# Patient Record
Sex: Female | Born: 1995 | Hispanic: No | Marital: Married | Smoking: Former smoker
Health system: Southern US, Community
[De-identification: ages and names within clinical notes are randomized; demographics above are authoritative.]

## PROBLEM LIST (undated history)

## (undated) ENCOUNTER — Ambulatory Visit: Admission: EM | Payer: BC Managed Care – PPO | Source: Home / Self Care

## (undated) DIAGNOSIS — B9689 Other specified bacterial agents as the cause of diseases classified elsewhere: Secondary | ICD-10-CM

## (undated) DIAGNOSIS — F32A Depression, unspecified: Secondary | ICD-10-CM

## (undated) DIAGNOSIS — J45909 Unspecified asthma, uncomplicated: Secondary | ICD-10-CM

## (undated) DIAGNOSIS — B009 Herpesviral infection, unspecified: Secondary | ICD-10-CM

## (undated) DIAGNOSIS — B379 Candidiasis, unspecified: Secondary | ICD-10-CM

## (undated) DIAGNOSIS — M6289 Other specified disorders of muscle: Secondary | ICD-10-CM

## (undated) DIAGNOSIS — L509 Urticaria, unspecified: Secondary | ICD-10-CM

## (undated) DIAGNOSIS — I82409 Acute embolism and thrombosis of unspecified deep veins of unspecified lower extremity: Secondary | ICD-10-CM

## (undated) DIAGNOSIS — N760 Acute vaginitis: Secondary | ICD-10-CM

## (undated) HISTORY — DX: Herpesviral infection, unspecified: B00.9

## (undated) HISTORY — DX: Other specified bacterial agents as the cause of diseases classified elsewhere: B96.89

## (undated) HISTORY — DX: Acute embolism and thrombosis of unspecified deep veins of unspecified lower extremity: I82.409

## (undated) HISTORY — DX: Other specified disorders of muscle: M62.89

## (undated) HISTORY — DX: Urticaria, unspecified: L50.9

## (undated) HISTORY — DX: Candidiasis, unspecified: B37.9

## (undated) HISTORY — DX: Depression, unspecified: F32.A

## (undated) HISTORY — PX: INDUCED ABORTION: SHX677

## (undated) HISTORY — DX: Other specified bacterial agents as the cause of diseases classified elsewhere: N76.0

## (undated) HISTORY — PX: ABDOMINAL HERNIA REPAIR: SHX539

---
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
## 2016-08-30 ENCOUNTER — Ambulatory Visit: Payer: Self-pay | Admitting: Student

## 2016-09-21 ENCOUNTER — Encounter: Payer: Medicaid Other | Admitting: Family Medicine

## 2016-12-01 ENCOUNTER — Ambulatory Visit: Payer: Medicaid Other | Admitting: Family Medicine

## 2016-12-01 NOTE — Progress Notes (Deleted)
    Subjective: CC: insomnia HPI: Patient is a 21 y.o. female presenting to clinic today for insomnia.    Social History: ***   ROS: All other systems reviewed and are negative.  Past Medical History Patient Active Problem List   Diagnosis Date Noted  . URI (upper respiratory infection) 08/26/2015  . Globus sensation 07/11/2015  . Thyroid disorder screening 07/11/2015  . Acne 03/28/2013  . Multiple allergies 02/21/2013  . OVERWEIGHT 04/03/2010    Medications- reviewed and updated Current Outpatient Prescriptions  Medication Sig Dispense Refill  . adapalene (DIFFERIN) 0.1 % cream Apply topically at bedtime. 45 g 3  . cetirizine (ZYRTEC) 10 MG tablet Take 1 tablet (10 mg total) by mouth daily. 30 tablet 3  . clindamycin (CLINDAGEL) 1 % gel Apply topically 2 (two) times daily. 30 g 0  . diazepam (VALIUM) 10 MG tablet Take 10 mg by mouth every 6 (six) hours as needed for anxiety.    . diphenhydrAMINE (BENADRYL) 25 mg capsule Take 50 mg by mouth every 6 (six) hours as needed.    . fluticasone (FLONASE) 50 MCG/ACT nasal spray Place 2 sprays into the nose daily. Two sprays into each nostril. 16 g 3  . ibuprofen (ADVIL,MOTRIN) 600 MG tablet Take 1 tablet (600 mg total) by mouth every 6 (six) hours as needed. 30 tablet 0  . ibuprofen (ADVIL,MOTRIN) 600 MG tablet Take 1 tablet (600 mg total) by mouth every 8 (eight) hours as needed. 30 tablet 0  . ipratropium (ATROVENT) 0.03 % nasal spray Place 2 sprays into both nostrils every 12 (twelve) hours. 30 mL 3  . naproxen (NAPROSYN) 500 MG tablet Take 1 tablet (500 mg total) by mouth 2 (two) times daily with a meal. (Patient not taking: Reported on 07/09/2015) 30 tablet 0   No current facility-administered medications for this visit.     Objective: Office vital signs reviewed. There were no vitals taken for this visit.   Physical Examination:  General: Awake, alert, *** nourished, NAD ENMT:  TMs intact, normal light reflex, no erythema,  no bulging. Nasal turbinates moist. MMM, Oropharynx clear without erythema or tonsillar exudate/hypertrophy Eyes: Conjunctiva non-injected. PERRL.  Cardio: RRR, no m/r/g noted.  Pulm: No increased WOB.  CTAB, without wheezes, rhonchi or crackles noted.  GI: soft, NT/ND,+BS x4, no hepatomegaly, no splenomegaly GU: deffered Extremities: WWP, No edema, cyanosis or clubbing; +*** pulses bilaterally MSK: Normal gait and station Skin: dry, intact, no rashes or lesions Neuro: Strength and sensation grossly intact, DTRs ***/4  Assessment/Plan: No problem-specific Assessment & Plan notes found for this encounter.   No orders of the defined types were placed in this encounter.   No orders of the defined types were placed in this encounter.   Joanna Puffrystal S. Sharni Negron PGY-3, [facility]

## 2016-12-07 ENCOUNTER — Ambulatory Visit: Payer: Medicaid Other | Admitting: Family Medicine

## 2017-01-07 ENCOUNTER — Ambulatory Visit: Payer: Medicaid Other | Admitting: Family Medicine

## 2017-02-07 ENCOUNTER — Encounter: Payer: Self-pay | Admitting: Internal Medicine

## 2017-02-07 ENCOUNTER — Ambulatory Visit (INDEPENDENT_AMBULATORY_CARE_PROVIDER_SITE_OTHER): Payer: BLUE CROSS/BLUE SHIELD | Admitting: Internal Medicine

## 2017-02-07 VITALS — BP 110/76 | HR 77 | Temp 98.4°F | Ht 64.0 in | Wt 163.0 lb

## 2017-02-07 DIAGNOSIS — J069 Acute upper respiratory infection, unspecified: Secondary | ICD-10-CM

## 2017-02-07 MED ORDER — BENZONATATE 100 MG PO CAPS: 100.0000 mg | ORAL_CAPSULE | Freq: Three times a day (TID) | ORAL | 0 refills | Status: DC | PRN

## 2017-02-07 MED ORDER — IBUPROFEN 600 MG PO TABS: 600.0000 mg | ORAL_TABLET | Freq: Three times a day (TID) | ORAL | 0 refills | Status: DC | PRN

## 2017-02-07 MED ORDER — OXYMETAZOLINE HCL 0.05 % NA SOLN: 1.0000 | Freq: Two times a day (BID) | NASAL | 0 refills | Status: DC

## 2017-02-07 NOTE — Progress Notes (Signed)
   [facility] Noralee CharsAsiyah Shawntia Mangal, MD Phone: [phone]  Reason For Visit: SDA for URI   URI  Has been sick for 3-4 days. Nasal discharge: yes Medications tried: DayQuil, Ibuprofen Sick contacts: work   Symptoms Fever: None  Headache or face pain: yes  Scratchy throat: yes  Allergies: none Muscle aches: None Severe fatigue: None  Stiff neck: None  Cough: Yes  Shortness of breath: None  Rash: None  Sore throat or swollen glands: None    Past Medical History Reviewed problem list.  Medications- reviewed and updated No additions to family history Social history- patient is a non smoker  Objective: BP 110/76 (BP Location: Left Arm, Patient Position: Sitting, Cuff Size: Normal)   Pulse 77   Temp 98.4 F (36.9 C) (Oral)   Ht 5\' 4"  (1.626 m)   Wt 163 lb (73.9 kg)   LMP 02/04/2017   SpO2 99%   BMI 27.98 kg/m  Gen: NAD, alert, cooperative with exam HEENT: Normal, no tenderness to palpation of maxillary or ethmoid sinuses     Neck: No masses palpated. No lymphadenopathy    Ears: Tympanic membranes intact, normal light reflex, no erythema, no bulging    Nose: nasal turbinates congested     Throat: erythematous throat, though tonsils wnl  Cardio: regular rate and rhythm, S1S2 heard, no murmurs appreciated Pulm: clear to auscultation bilaterally, no wheezes, rhonchi or rales Skin: dry, intact, no rashes or lesions    Assessment/Plan: See problem based a/p  URI (upper respiratory infection) Acute upper respiratory infection, symptomatic treatment. No concern for sinus infection  - ibuprofen (ADVIL,MOTRIN) 600 MG tablet; Take 1 tablet (600 mg total) by mouth every 8 (eight) hours as needed.  Dispense: 30 tablet; Refill: 0 - benzonatate (TESSALON PERLES) 100 MG capsule; Take 1 capsule (100 mg total) by mouth 3 (three) times daily as needed for cough.  Dispense: 20 capsule; Refill: 0 - oxymetazoline (AFRIN NASAL SPRAY) 0.05 % nasal spray; Place 1  spray into both nostrils 2 (two) times daily.  Dispense: 30 mL; Refill: 0

## 2017-02-07 NOTE — Patient Instructions (Signed)
You have an upper respiratory tract infection. You should improve in the next couple of days. You can use the medications as prescribed to help with your cough and congestion.

## 2017-02-08 NOTE — Assessment & Plan Note (Signed)
Acute upper respiratory infection, symptomatic treatment. No concern for sinus infection  - ibuprofen (ADVIL,MOTRIN) 600 MG tablet; Take 1 tablet (600 mg total) by mouth every 8 (eight) hours as needed.  Dispense: 30 tablet; Refill: 0 - benzonatate (TESSALON PERLES) 100 MG capsule; Take 1 capsule (100 mg total) by mouth 3 (three) times daily as needed for cough.  Dispense: 20 capsule; Refill: 0 - oxymetazoline (AFRIN NASAL SPRAY) 0.05 % nasal spray; Place 1 spray into both nostrils 2 (two) times daily.  Dispense: 30 mL; Refill: 0

## 2017-04-26 ENCOUNTER — Encounter (HOSPITAL_COMMUNITY): Payer: Self-pay | Admitting: *Deleted

## 2017-04-26 ENCOUNTER — Ambulatory Visit (HOSPITAL_COMMUNITY)
Admission: EM | Admit: 2017-04-26 | Discharge: 2017-04-26 | Disposition: A | Discharge status: Home / Self Care | Payer: BLUE CROSS/BLUE SHIELD | Attending: Internal Medicine | Admitting: Internal Medicine

## 2017-04-26 DIAGNOSIS — J0300 Acute streptococcal tonsillitis, unspecified: Secondary | ICD-10-CM | POA: Diagnosis not present

## 2017-04-26 LAB — POCT RAPID STREP A: Streptococcus, Group A Screen (Direct): POSITIVE — AB

## 2017-04-26 MED ORDER — AMOXICILLIN 500 MG PO CAPS: 500.0000 mg | ORAL_CAPSULE | Freq: Three times a day (TID) | ORAL | 0 refills | Status: AC

## 2017-04-26 MED ORDER — ACETAMINOPHEN 325 MG PO TABS
650.0000 mg | ORAL_TABLET | Freq: Once | ORAL | Status: AC
  Administered 2017-04-26: 650 mg via ORAL

## 2017-04-26 MED ORDER — ACETAMINOPHEN 325 MG PO TABS
ORAL_TABLET | ORAL | Status: AC
  Filled 2017-04-26: qty 2

## 2017-04-26 NOTE — ED Provider Notes (Signed)
[facility]    CSN: [id number] Arrival date & time: 04/26/17  1230     History   Chief Complaint Chief Complaint  Patient presents with  . Dizziness  . Sore Throat  . Generalized Body Aches    HPI Olivia Hayes is a 21 y.o. female.   The history is provided by the patient. No language interpreter was used.  Sore Throat  This is a new problem. The current episode started 2 days ago. The problem occurs constantly. The problem has not changed since onset.Pertinent negatives include no headaches.    Past Medical History:  Diagnosis Date  . Asthma    exercise induced    Patient Active Problem List   Diagnosis Date Noted  . Strep tonsillitis 04/26/2017  . Acute upper respiratory infection 02/07/2017  . URI (upper respiratory infection) 08/26/2015  . Globus sensation 07/11/2015  . Thyroid disorder screening 07/11/2015  . Acne 03/28/2013  . Multiple allergies 02/21/2013  . OVERWEIGHT 04/03/2010    Past Surgical History:  Procedure Laterality Date  . ABDOMINAL HERNIA REPAIR  age 65    OB History    No data available       Home Medications    Prior to Admission medications   Medication Sig Start Date End Date Taking? Authorizing Provider  adapalene (DIFFERIN) 0.1 % cream Apply topically at bedtime. 05/06/15   Ardith DarkParker, Caleb M, MD  amoxicillin (AMOXIL) 500 MG capsule Take 1 capsule (500 mg total) by mouth 3 (three) times daily. 04/26/17 05/06/17  Lavaris Sexson, Para MarchJeanette, NP  benzonatate (TESSALON PERLES) 100 MG capsule Take 1 capsule (100 mg total) by mouth 3 (three) times daily as needed for cough. 02/07/17   Mikell, Antionette PolesAsiyah Zahra, MD  cetirizine (ZYRTEC) 10 MG tablet Take 1 tablet (10 mg total) by mouth daily. 02/21/13   Street, Stephanie Couphristopher M, MD  clindamycin (CLINDAGEL) 1 % gel Apply topically 2 (two) times daily. 05/06/15   Ardith DarkParker, Caleb M, MD  diazepam (VALIUM) 10 MG tablet Take 10 mg by mouth every 6 (six) hours as needed for anxiety.    [provider]  diphenhydrAMINE (BENADRYL) 25 mg capsule Take 50 mg by mouth every 6 (six) hours as needed.    [provider]  fluticasone (FLONASE) 50 MCG/ACT nasal spray Place 2 sprays into the nose daily. Two sprays into each nostril. 02/21/13   Street, Stephanie Couphristopher M, MD  ibuprofen (ADVIL,MOTRIN) 600 MG tablet Take 1 tablet (600 mg total) by mouth every 8 (eight) hours as needed. 02/07/17   Mikell, Antionette PolesAsiyah Zahra, MD  ipratropium (ATROVENT) 0.03 % nasal spray Place 2 sprays into both nostrils every 12 (twelve) hours. 08/25/15   Myra RudeSchmitz, Jeremy E, MD  naproxen (NAPROSYN) 500 MG tablet Take 1 tablet (500 mg total) by mouth 2 (two) times daily with a meal. Patient not taking: Reported on 07/09/2015 10/15/13   Arthor CaptainHarris, Abigail, PA-C  oxymetazoline (AFRIN NASAL SPRAY) 0.05 % nasal spray Place 1 spray into both nostrils 2 (two) times daily. 02/07/17   Berton BonMikell, Asiyah Zahra, MD    Family History History reviewed. No pertinent family history.  Social History Social History  Substance Use Topics  . Smoking status: Never Smoker  . Smokeless tobacco: Never Used  . Alcohol use No     Allergies   Peanuts [peanut oil]   Review of Systems Review of Systems  Constitutional: Positive for fever. Negative for chills.  HENT: Positive for congestion, ear pain and sore throat. Negative for rhinorrhea.  Eyes: Negative.   Respiratory: Negative for cough.   Gastrointestinal: Negative for nausea and vomiting.  Endocrine: Negative.   Genitourinary: Negative for dysuria.  Musculoskeletal: Negative for myalgias.  Skin: Negative for rash.  Allergic/Immunologic: Negative.   Neurological: Negative for headaches.  Hematological: Negative.   Psychiatric/Behavioral: Negative.   All other systems reviewed and are negative.    Physical Exam Triage Vital Signs ED Triage Vitals  Enc Vitals Group     BP 04/26/17 1313 117/68     Pulse Rate 04/26/17 1313 (!) 116     Resp 04/26/17 1313 16     Temp 04/26/17 1313 100.1  F (37.8 C)     Temp Source 04/26/17 1313 Oral     SpO2 04/26/17 1313 100 %     Weight 04/26/17 1313 162 lb (73.5 kg)     Height 04/26/17 1313 5\' 4"  (1.626 m)     Head Circumference --      Peak Flow --      Pain Score 04/26/17 1315 6     Pain Loc --      Pain Edu? --      Excl. in GC? --    No data found.   Updated Vital Signs BP 117/68 (BP Location: Left Arm)   Pulse (!) 116   Temp 100.1 F (37.8 C) (Oral)   Resp 16   Ht 5\' 4"  (1.626 m)   Wt 162 lb (73.5 kg)   SpO2 100%   BMI 27.81 kg/m   Visual Acuity Right Eye Distance:   Left Eye Distance:   Bilateral Distance:    Right Eye Near:   Left Eye Near:    Bilateral Near:     Physical Exam  Constitutional: She is oriented to person, place, and time. She appears well-developed and well-nourished. She is active and cooperative. No distress.  HENT:  Head: Normocephalic.  Right Ear: Tympanic membrane is retracted.  Left Ear: Tympanic membrane is retracted.  Nose: Mucosal edema present.  Mouth/Throat: Uvula is midline. No trismus in the jaw. Oropharyngeal exudate, posterior oropharyngeal edema and posterior oropharyngeal erythema present. Tonsils are 2+ on the right. Tonsils are 2+ on the left. Tonsillar exudate.  Eyes: Pupils are equal, round, and reactive to light. Conjunctivae, EOM and lids are normal.  Neck: Normal range of motion. No tracheal deviation present.  Cardiovascular: Normal heart sounds and normal pulses.  Tachycardia present.   No murmur heard. Pulmonary/Chest: Effort normal and breath sounds normal.  Abdominal: Soft. Bowel sounds are normal. There is no tenderness.  Musculoskeletal: Normal range of motion.  Lymphadenopathy:    She has no cervical adenopathy.  Neurological: She is alert and oriented to person, place, and time. GCS eye subscore is 4. GCS verbal subscore is 5. GCS motor subscore is 6.  Skin: Skin is warm and dry. No rash noted.  Psychiatric: She has a normal mood and affect. Her speech  is normal and behavior is normal.  Nursing note and vitals reviewed.    UC Treatments / Results  Labs (all labs ordered are listed, but only abnormal results are displayed) Labs Reviewed  POCT RAPID STREP A - Abnormal; Notable for the following:       Result Value   Streptococcus, Group A Screen (Direct) POSITIVE (*)    All other components within normal limits    EKG  EKG Interpretation None       Radiology No results found.  Procedures Procedures (including critical care time)  Medications  Ordered in UC Medications  acetaminophen (TYLENOL) tablet 650 mg (650 mg Oral Given 04/26/17 1330)     Initial Impression / Assessment and Plan / UC Course  I have reviewed the triage vital signs and the nursing notes.  Pertinent labs & imaging results that were available during my care of the patient were reviewed by me and considered in my medical decision making (see chart for details).     +Strep. Rest,push fluids, take amoxicillin as directed until completed, do not share any. Return to UC as needed.   Final Clinical Impressions(s) / UC Diagnoses   Final diagnoses:  Strep tonsillitis    New Prescriptions New Prescriptions   AMOXICILLIN (AMOXIL) 500 MG CAPSULE    Take 1 capsule (500 mg total) by mouth 3 (three) times daily.     Controlled Substance Prescriptions    Clancy Gourd, NP 04/26/17 1441

## 2017-04-26 NOTE — ED Triage Notes (Signed)
Patient reports sore throat, dizziness, and body aches. Patient with low grade fever. Patient took nyquil last night.

## 2017-04-26 NOTE — Discharge Instructions (Signed)
Rest,push fluids, take amoxicillin as directed until completed, do not share any. Return to UC as needed.

## 2017-05-19 ENCOUNTER — Ambulatory Visit (INDEPENDENT_AMBULATORY_CARE_PROVIDER_SITE_OTHER): Payer: BLUE CROSS/BLUE SHIELD | Admitting: Internal Medicine

## 2017-05-19 ENCOUNTER — Encounter: Payer: Self-pay | Admitting: Internal Medicine

## 2017-05-19 ENCOUNTER — Other Ambulatory Visit: Payer: Self-pay

## 2017-05-19 VITALS — BP 101/80 | HR 97 | Temp 98.5°F | Ht 63.0 in | Wt 165.8 lb

## 2017-05-19 DIAGNOSIS — J0300 Acute streptococcal tonsillitis, unspecified: Secondary | ICD-10-CM

## 2017-05-19 DIAGNOSIS — J029 Acute pharyngitis, unspecified: Secondary | ICD-10-CM

## 2017-05-19 LAB — POCT RAPID STREP A (OFFICE): Rapid Strep A Screen: POSITIVE — AB

## 2017-05-19 MED ORDER — AMOXICILLIN-POT CLAVULANATE 875-125 MG PO TABS: 1.0000 | ORAL_TABLET | Freq: Two times a day (BID) | ORAL | 0 refills | Status: DC

## 2017-05-19 NOTE — Assessment & Plan Note (Addendum)
Rapid strep positive in office today. Afebrile, well-appearing on exam. Well-hydrated. No signs of tonsillar abscess. Recently completed course of amoxicillin. Unsure if initial strep tonsillitis not adequately treated, or if patient was full treated then re-exposed, so will treat with Augmentin to prevent further recurrences.  - Begin Augmentin BID x10d - Supportive measures discussed - Return precautions discussed

## 2017-05-19 NOTE — Progress Notes (Signed)
Subjective:   Patient: Olivia Hayes       Birthdate: December 31, 1995       MRN: [id number]      HPI  Diamondnique Tippen is a 21 y.o. female presenting for same day appt for sore throat.   Sore throat Began 2d ago. Patient was diagnosed with strep throat on 10/23. She was prescribed course of amoxicillin which she completed. Said symptoms completely resolved after completing abx course. Since then, she has been around her sister who has a sore throat, and two days ago her sore throat returned. Also has body aches and an occasional dry cough. Denies fevers, chills, nausea, vomiting. Has been taking ibuprofen which does help. Hurts to swallow but has a normal appetite and has been drinking well.   Smoking status reviewed. Patient is never smoker.   Review of Systems See HPI.     Objective:  Physical Exam  Constitutional: She is oriented to person, place, and time and well-developed, well-nourished, and in no distress.  HENT:  Oropharyngeal erythema but no exudates present. TM NL bilaterally. MMM. NCAT.   Neck: Normal range of motion. Neck supple.  Cardiovascular: Normal rate, regular rhythm and normal heart sounds.  No murmur heard. Pulmonary/Chest: Effort normal and breath sounds normal. No respiratory distress. She has no wheezes.  Lymphadenopathy:    She has cervical adenopathy.  Neurological: She is alert and oriented to person, place, and time.  Skin: Skin is warm and dry.  Psychiatric: Affect and judgment normal.     Assessment & Plan:  Strep tonsillitis Rapid strep positive in office today. Afebrile, well-appearing on exam. Well-hydrated. No signs of tonsillar abscess. Recently completed course of amoxicillin. Unsure if initial strep tonsillitis not adequately treated, or if patient was full treated then re-exposed, so will treat with Augmentin to prevent further recurrences.  - Begin Augmentin BID x10d - Supportive measures discussed - Return precautions discussed   Tarri Abernethy, MD, MPH PGY-3 [facility] Pager [phone]

## 2017-05-19 NOTE — Patient Instructions (Addendum)
It was nice meeting you today Olivia Hayes!  Please begin taking the antibiotic (Augmentin) twice a day for the next 10 days.   For throat pain, you can continue to take ibuprofen as needed. Chloraseptic throat spray and Cepachol throat lozenges both have a numbing medicine in them that will help soothe your throat. You can buy these at the drugstore. You can also try drinking warm tea with honey and lemon to help with the pain.   If you are not getting better by the time you finish the antibiotic, please let us know.   If you have any questions or concerns, please feel free to call the clinic.   Be well,  Dr. Natale MilchLancaster

## 2017-07-07 ENCOUNTER — Ambulatory Visit (INDEPENDENT_AMBULATORY_CARE_PROVIDER_SITE_OTHER): Payer: BLUE CROSS/BLUE SHIELD | Admitting: Student

## 2017-07-07 ENCOUNTER — Encounter: Payer: Self-pay | Admitting: Student

## 2017-07-07 ENCOUNTER — Other Ambulatory Visit: Payer: Self-pay

## 2017-07-07 VITALS — BP 108/72 | HR 74 | Temp 98.5°F | Ht 64.0 in | Wt 168.4 lb

## 2017-07-07 DIAGNOSIS — B9789 Other viral agents as the cause of diseases classified elsewhere: Secondary | ICD-10-CM

## 2017-07-07 DIAGNOSIS — J069 Acute upper respiratory infection, unspecified: Secondary | ICD-10-CM | POA: Diagnosis not present

## 2017-07-07 DIAGNOSIS — R042 Hemoptysis: Secondary | ICD-10-CM

## 2017-07-07 NOTE — Progress Notes (Signed)
Subjective:    Olivia Hayes is a 22 y.o. old female here cough  HPI Cough: this has been going on for three weeks. Cough was initially barky.  Started to get productive for the last two days. Phlegm appears dark and yellowish. Initially with blood tinge. Had quarter size hemoptysis once this morning.  She denies further hemoptysis.  Denies fever. Admits congestion but no runny nose.  Denies heartburn or postnasal dripping. Reports shortness breath with cough. Feels like gagging and vomiting. No blood in emesis. Denies chest pain, leg swelling and pain. Admits myalgia.  Sick contacts include a nephew with "bronchitis" and a younger brother with cough.  Denies smoking cigarettes.  Denies unintentional weight loss.  Reports recent travel to [place] about 2 months ago. She says she had 4 connections with break in between. Not on OCP.  Denies personal or family history of blood clots or bleeding disorder.  She has not had a flu shot this year.  PMH/Problem List: has OVERWEIGHT; Multiple allergies; Acne; Globus sensation; Thyroid disorder screening; URI (upper respiratory infection); Acute upper respiratory infection; and Strep tonsillitis on their problem list.   has a past medical history of Asthma.  FH:  No family history on file.  SH Social History   Tobacco Use  . Smoking status: Never Smoker  . Smokeless tobacco: Never Used  Substance Use Topics  . Alcohol use: No  . Drug use: No    Review of Systems Review of systems negative except for pertinent positives and negatives in history of present illness above.     Objective:     Vitals:   07/07/17 1427  BP: 108/72  Pulse: 74  Temp: 98.5 F (36.9 C)  TempSrc: Oral  SpO2: 97%  Weight: 168 lb 6.4 oz (76.4 kg)  Height: 5\' 4"  (1.626 m)   Body mass index is 28.91 kg/m.  Physical Exam  GEN: appears well, no apparent distress. Head: normocephalic and atraumatic  Eyes: conjunctiva without injection, sclera anicteric Nares: no  rhinorrhea, congestion or erythema.  No fresh or scabbed Blood. Oropharynx: mmm without erythema or exudation HEM: negative for cervical or periauricular lymphadenopathies CVS: RRR, nl s1 & s2, no murmurs, no edema RESP: no IWOB, good air movement bilaterally, CTAB GI: BS present & normal, soft, NTND MSK: no focal tenderness or notable swelling SKIN: no apparent skin lesion NEURO: alert and oiented appropriately, no gross deficits  PSYCH: euthymic mood with congruent affect Assessment and Plan:  1. Viral URI with cough: history and exam suggestive for viral URTI.  She has positive sick contact.  Appears well and has no respiratory distress. Lung exam normal.  Hemoptysis likely due to inflammation from viral URI versus bacterial infection, PE or malignancy.  She has recent travel to [place] about 2 months ago but had 4 connections with break in between.  She is not on OCP.  No personal or family history of DVT or bleeding disorder.  We will hold off chest x-ray unless she has further hemoptysis.  Patient to let me know.  -Recommended conservative management including rest, adequate hydration and a tablespoonful of honey before bedtime.  She understands that cough might last up to 5 weeks. -Discussed return precautions including but not limited to shortness of breath or increased working of breathing, severe persistent cough, persistent fever over 101F, not tolerating fluids by mouth or other symptoms concerning to her  Return if symptoms worsen or fail to improve.  Almon Herculesaye T Orlandus Borowski, MD 07/07/17 Pager: [phone]

## 2017-07-07 NOTE — Patient Instructions (Signed)
It appears that you have a viral upper respiratory infection (Common Cold).  Symptoms typically peak at 3-4 days of illness and then gradually improve over 10-14 days. However, a cough may last 2-4 weeks.   -You may get Mucinex DM which is available over-the-counter. - A tablespoonful of honey before bedtime is helpful for cough - Get plenty of rest and adequate hydration. - Consume warm fluids (soup or tea) to provide relief for a stuffy nose and to loosen phlegm. - For nasal stuffiness, try saline nasal spray or a Neti Pot. Eating warm liquids such as chicken soup or tea may also help with nasal congestion. - For sore throat pain relief: suck on throat lozenges, gargle with warm salt water (1/4 tsp. salt per 8 oz. of water); and eat soft, bland foods. - Eat a well-balanced diet. If you cannot, ensure you are getting enough nutrients by taking a daily multivitamin. - Avoid dairy products, as they can thicken phlegm. - Avoid alcohol, as it impairs your body's immune system.  CONTACT YOUR DOCTOR IF YOU EXPERIENCE ANY OF THE FOLLOWING: - High fever, chest pain, shortness of breath or  not able to keep down food or fluids.  - Cough that gets worse while other cold symptoms improve - Flare up of any chronic lung problem, such as asthma - Your symptoms persist longer than 2 weeks

## 2017-09-15 ENCOUNTER — Ambulatory Visit: Payer: BLUE CROSS/BLUE SHIELD | Admitting: Family Medicine

## 2017-09-15 ENCOUNTER — Other Ambulatory Visit: Payer: Self-pay

## 2017-09-15 ENCOUNTER — Encounter: Payer: Self-pay | Admitting: Family Medicine

## 2017-09-15 VITALS — BP 108/60 | HR 72 | Temp 98.2°F | Wt 173.0 lb

## 2017-09-15 DIAGNOSIS — J040 Acute laryngitis: Secondary | ICD-10-CM

## 2017-09-15 NOTE — Patient Instructions (Signed)
Laryngitis Laryngitis is swelling (inflammation) of your vocal cords. This causes hoarseness, coughing, loss of voice, sore throat, or a dry throat. When your vocal cords are inflamed, your voice sounds different. Laryngitis can be temporary (acute) or long-term (chronic). Most cases of acute laryngitis improve with time. Chronic laryngitis is laryngitis that lasts for more than three weeks. Follow these instructions at home:  Drink enough fluid to keep your pee (urine) clear or pale yellow.  Breathe in moist air. Use a humidifier if you live in a dry climate.  Take medicines only as told by your doctor.  Do not smoke cigarettes or electronic cigarettes. If you need help quitting, ask your doctor.  Talk as little as possible. Also avoid whispering, which can cause vocal strain.  Write instead of talking. Do this until your voice is back to normal. Contact a doctor if:  You have a fever.  Your pain is worse.  You have trouble swallowing. Get help right away if:  You cough up blood.  You have trouble breathing. This information is not intended to replace advice given to you by your health care provider. Make sure you discuss any questions you have with your health care provider. Document Released: 06/10/2011 Document Revised: 11/27/2015 Document Reviewed: 12/04/2013 Elsevier Interactive Patient Education  2018 [employer]  

## 2017-09-15 NOTE — Progress Notes (Signed)
Subjective:    Patient ID: Olivia Hayes, female    DOB: [birth year], 22 y.o.   MRN: [id number]   CC: lost voice/cold  Had cough and congestion over past week and woke up yesterday with no voice. She reports her throat is scratchy. Denies fevers or chills. Feels well overall just cannot talk. She occasionally vapes. She endorses seasonal allergies.   Smoking status reviewed- e-cigarettes occasionally  Review of Systems- see HPI   Objective:  BP 108/60   Pulse 72   Temp 98.2 F (36.8 C) (Oral)   Wt 173 lb (78.5 kg)   LMP 08/24/2017 (Approximate)   BMI 29.70 kg/m  Vitals and nursing note reviewed  General: well nourished, in no acute distress HEENT: normocephalic, no nasal discharge, moist mucous membranes, good dentition. Minimal erythema noted in posterior oropharynx. No exudate noted. Neck: supple, non-tender, without lymphadenopathy Cardiac: RRR, clear S1 and S2, no murmurs, rubs, or gallops Respiratory: clear to auscultation bilaterally, no increased work of breathing Skin: warm and dry, no rashes noted Neuro: alert and oriented, no focal deficits   Assessment & Plan:   1. Laryngitis Likely secondary to recent viral URI. No fever. Patient well appearing and feels well. Discussed avoiding using voice and avoiding whispering. Work not given. Follow up if symptoms worsen or fail to improve or develops fevers. Patient verbalized understanding and agreement with plan.  Return if symptoms worsen or fail to improve.   Dolores Patty, DO Family Medicine Resident PGY-2

## 2017-10-31 ENCOUNTER — Ambulatory Visit: Payer: BLUE CROSS/BLUE SHIELD | Admitting: Family Medicine

## 2017-10-31 ENCOUNTER — Other Ambulatory Visit (HOSPITAL_COMMUNITY)
Admission: RE | Admit: 2017-10-31 | Discharge: 2017-10-31 | Disposition: A | Discharge status: Home / Self Care | Payer: BLUE CROSS/BLUE SHIELD | Source: Ambulatory Visit | Attending: Family Medicine | Admitting: Family Medicine

## 2017-10-31 ENCOUNTER — Encounter: Payer: Self-pay | Admitting: Family Medicine

## 2017-10-31 ENCOUNTER — Other Ambulatory Visit: Payer: Self-pay

## 2017-10-31 VITALS — BP 100/70 | HR 73 | Temp 98.1°F | Wt 172.0 lb

## 2017-10-31 DIAGNOSIS — Z124 Encounter for screening for malignant neoplasm of cervix: Secondary | ICD-10-CM | POA: Diagnosis not present

## 2017-10-31 DIAGNOSIS — N898 Other specified noninflammatory disorders of vagina: Secondary | ICD-10-CM | POA: Insufficient documentation

## 2017-10-31 LAB — POCT WET PREP (WET MOUNT)
Clue Cells Wet Prep Whiff POC: NEGATIVE
TRICHOMONAS WET PREP HPF POC: ABSENT

## 2017-10-31 MED ORDER — FLUCONAZOLE 150 MG PO TABS: 150.0000 mg | ORAL_TABLET | Freq: Once | ORAL | 0 refills | Status: AC

## 2017-10-31 NOTE — Progress Notes (Signed)
   Subjective:    Patient ID: Olivia Hayes , female   DOB: 1995/11/11 , 22 y.o..   MRN: [id number]  HPI  Cristie Mckinney is here for  Chief Complaint  Patient presents with  . Vaginal Discharge    1. VAGINAL DISCHARGE  Having vaginal discharge for 3 days. Discharge consistency: cheesy Discharge color: white Medications tried: none  Recent antibiotic use: no Sex in last month: yes, used condoms Possible STD exposure:unsure  Symptoms Fever: no Dysuria:no Vaginal bleeding: no Abdomen or Pelvic pain: no Back pain: no Genital sores or ulcers:no Rash: no Pain during sex: sometimes Missed menstrual period: no  ROS see HPI Smoking Status noted   Past Medical History: Patient Active Problem List   Diagnosis Date Noted  . Strep tonsillitis 04/26/2017  . Globus sensation 07/11/2015  . Thyroid disorder screening 07/11/2015  . Acne 03/28/2013  . Multiple allergies 02/21/2013  . OVERWEIGHT 04/03/2010    Medications: reviewed   Social Hx:  reports that she has been smoking.  She has never used smokeless tobacco.   Objective:   BP 100/70   Pulse 73   Temp 98.1 F (36.7 C) (Oral)   Wt 172 lb (78 kg)   LMP 10/10/2017   SpO2 99%   BMI 29.52 kg/m  Physical Exam  Gen: NAD, alert, cooperative with exam, well-appearing GYN:  External genitalia within normal limits.  Vaginal mucosa pink, moist, normal rugae. Tenderness with speculum insertion. Nonfriable cervix without lesions. Clear mucous discharge around cervix.  Bimanual exam revealed normal, nongravid uterus.  No cervical motion tenderness. No adnexal masses bilaterally.    Results for orders placed or performed in visit on 10/31/17  POCT Wet Prep [facility])  Result Value Ref Range   Source Wet Prep POC VAG    WBC, Wet Prep HPF POC >20    Bacteria Wet Prep HPF POC Few Few   Clue Cells Wet Prep HPF POC None None   Clue Cells Wet Prep Whiff POC Negative Whiff    Yeast Wet Prep HPF POC Few    KOH Wet Prep POC  Few    Trichomonas Wet Prep HPF POC Absent Absent     Assessment & Plan:   1. Vaginal discharge: Wet prep showing few yeast, no signs of trichomonas or BV. - POCT Wet Prep [facility]) - Cervicovaginal ancillary only; GC chlamydia pending -Prescription for Diflucan sent to see if this helps her symptoms  2. Screening for cervical cancer - Cytology - PAP([place])  Orders Placed This Encounter  Procedures  . POCT Wet Prep [facility])   Meds ordered this encounter  Medications  . fluconazole (DIFLUCAN) 150 MG tablet    Sig: Take 1 tablet (150 mg total) by mouth once for 1 dose.    Dispense:  1 tablet    Refill:  0    Anders Simmonds, MD [facility], PGY-3

## 2017-11-01 LAB — CYTOLOGY - PAP: DIAGNOSIS: NEGATIVE

## 2017-11-01 LAB — CERVICOVAGINAL ANCILLARY ONLY
Chlamydia: NEGATIVE
NEISSERIA GONORRHEA: NEGATIVE

## 2017-11-02 ENCOUNTER — Encounter: Payer: Self-pay | Admitting: Family Medicine

## 2017-12-26 ENCOUNTER — Other Ambulatory Visit: Payer: Self-pay

## 2017-12-26 ENCOUNTER — Encounter: Payer: Self-pay | Admitting: Internal Medicine

## 2017-12-26 ENCOUNTER — Ambulatory Visit (INDEPENDENT_AMBULATORY_CARE_PROVIDER_SITE_OTHER): Payer: BLUE CROSS/BLUE SHIELD | Admitting: Internal Medicine

## 2017-12-26 VITALS — BP 128/78 | HR 90 | Temp 98.5°F | Ht 64.5 in | Wt 175.0 lb

## 2017-12-26 DIAGNOSIS — N898 Other specified noninflammatory disorders of vagina: Secondary | ICD-10-CM | POA: Diagnosis not present

## 2017-12-26 DIAGNOSIS — R300 Dysuria: Secondary | ICD-10-CM | POA: Diagnosis not present

## 2017-12-26 LAB — POCT UA - MICROSCOPIC ONLY

## 2017-12-26 LAB — POCT URINALYSIS DIP (MANUAL ENTRY)
BILIRUBIN UA: NEGATIVE
BILIRUBIN UA: NEGATIVE mg/dL
Glucose, UA: NEGATIVE mg/dL
Nitrite, UA: POSITIVE — AB
PH UA: 5.5 (ref 5.0–8.0)
Protein Ur, POC: NEGATIVE mg/dL
SPEC GRAV UA: 1.025 (ref 1.010–1.025)
Urobilinogen, UA: 0.2 E.U./dL

## 2017-12-26 MED ORDER — FLUCONAZOLE 150 MG PO TABS: 150.0000 mg | ORAL_TABLET | ORAL | 0 refills | Status: AC

## 2017-12-26 MED ORDER — CIPROFLOXACIN HCL 500 MG PO TABS: 500.0000 mg | ORAL_TABLET | Freq: Two times a day (BID) | ORAL | 0 refills | Status: DC

## 2017-12-26 NOTE — Progress Notes (Signed)
Subjective:   Patient: Olivia Hayes       Birthdate: [birth year]       MRN: [id number]      HPI  Olivia Hayes is a 22 y.o. female presenting for same day appt for vaginal discharge and dysuria.   Vaginal discharge, dysuria Patient presented for vaginal discharge two months ago. Was found to have yeast infection and prescribed diflucan. Says that sx improved somewhat, however never fully resolved. Sx have been worsening recently, and patient has significant vaginal itching. Discharge present today is the same as discharge she has had with yeast infections in the past. Also reports dysuria, however she is unsure if this is due to urine coming into contact with irritated skin from scratching. Has not taken any antibiotics recently. Has been using hydrocortisone and topical Benadryl on pruritic areas. Topical Benadryl helps somewhat. Also endorses suprapubic pain. Denies fevers, nausea, vomiting. LMP 2-3w ago. Sexually active but not for past month. Not on hormonal contraceptives but does use condoms.   Smoking status reviewed. Patient is some day smoker.   Review of Systems See HPI.     Objective:  Physical Exam  Constitutional: She is oriented to person, place, and time. She appears well-developed and well-nourished. No distress.  HENT:  Head: Normocephalic and atraumatic.  Pulmonary/Chest: Effort normal. No respiratory distress.  Genitourinary:  Genitourinary Comments: Declines pelvic exam today.   Neurological: She is alert and oriented to person, place, and time.  Psychiatric: She has a normal mood and affect. Her behavior is normal.      Assessment & Plan:  Vaginal discharge Discharge and pruritis consistent in description with yeast vaginitis, and patient says sx are same as when diagnosed with yeast vaginitis in past. Possible that yeast infection two months ago never resolved, however also could be a new yeast infection. Will treat with diflucan, three tabs q72hrs for recurrent  infection. UA consistent with UTI. Will treat with cipro BIDx10d. F/u if no improvement.    Tarri Abernethy, MD, MPH PGY-3 [facility] Pager [phone]

## 2017-12-26 NOTE — Patient Instructions (Addendum)
It was nice seeing you again today Olivia Hayes!  For your urinary tract infection, please begin taking ciprofloxacin (antibiotic) one tablet twice a day for the next 7 days. For your yeast infection, please take one fluconazole (Diflucan tablet) every 72 hours for three doses. If your symptoms do not improve with the medications, or if you develop fevers, nausea, or vomiting, please let us know.   If you have any questions or concerns, please feel free to call the clinic.   Be well,  Dr. Natale MilchLancaster

## 2017-12-26 NOTE — Assessment & Plan Note (Signed)
Discharge and pruritis consistent in description with yeast vaginitis, and patient says sx are same as when diagnosed with yeast vaginitis in past. Possible that yeast infection two months ago never resolved, however also could be a new yeast infection. Will treat with diflucan, three tabs q72hrs for recurrent infection. UA consistent with UTI. Will treat with cipro BIDx10d. F/u if no improvement.

## 2018-01-03 ENCOUNTER — Encounter: Payer: Self-pay | Admitting: Family Medicine

## 2018-01-03 ENCOUNTER — Ambulatory Visit (INDEPENDENT_AMBULATORY_CARE_PROVIDER_SITE_OTHER): Payer: BLUE CROSS/BLUE SHIELD | Admitting: Family Medicine

## 2018-01-03 ENCOUNTER — Other Ambulatory Visit: Payer: Self-pay

## 2018-01-03 VITALS — BP 112/60 | HR 71 | Temp 98.1°F | Ht 65.0 in | Wt 172.2 lb

## 2018-01-03 DIAGNOSIS — Z0001 Encounter for general adult medical examination with abnormal findings: Secondary | ICD-10-CM | POA: Diagnosis not present

## 2018-01-03 DIAGNOSIS — B354 Tinea corporis: Secondary | ICD-10-CM

## 2018-01-03 DIAGNOSIS — Z1389 Encounter for screening for other disorder: Secondary | ICD-10-CM

## 2018-01-03 DIAGNOSIS — Z23 Encounter for immunization: Secondary | ICD-10-CM

## 2018-01-03 DIAGNOSIS — Z1339 Encounter for screening examination for other mental health and behavioral disorders: Secondary | ICD-10-CM

## 2018-01-03 DIAGNOSIS — Z0000 Encounter for general adult medical examination without abnormal findings: Secondary | ICD-10-CM

## 2018-01-03 MED ORDER — ECONAZOLE NITRATE 1 % EX CREA: TOPICAL_CREAM | Freq: Every day | CUTANEOUS | 0 refills | Status: DC

## 2018-01-03 NOTE — Patient Instructions (Signed)
It was great meeting you today! I am glad that everything has been going well with your health. I will make a referral to get you set up with gynecology and psychiatry in regards to getting tested for ADHD. I think you have a small fungal infection likely due to increased moisture where your name badge goes. I am giving you a topical antifungal for this. You are also getting a TDAP prior to leaving.

## 2018-01-06 ENCOUNTER — Encounter: Payer: Self-pay | Admitting: Family Medicine

## 2018-01-06 DIAGNOSIS — Z1389 Encounter for screening for other disorder: Secondary | ICD-10-CM

## 2018-01-06 DIAGNOSIS — B354 Tinea corporis: Secondary | ICD-10-CM | POA: Insufficient documentation

## 2018-01-06 DIAGNOSIS — R41840 Attention and concentration deficit: Secondary | ICD-10-CM | POA: Insufficient documentation

## 2018-01-06 DIAGNOSIS — Z0000 Encounter for general adult medical examination without abnormal findings: Secondary | ICD-10-CM | POA: Insufficient documentation

## 2018-01-06 DIAGNOSIS — Z23 Encounter for immunization: Secondary | ICD-10-CM | POA: Insufficient documentation

## 2018-01-06 NOTE — Assessment & Plan Note (Signed)
Given the chronic moisture in the area from sweating and indeterminate skin finding on exam, I will treat for possible tinea corporis.  - Econazole nitrate 1% apply daily to area until resolution

## 2018-01-06 NOTE — Assessment & Plan Note (Signed)
Patient overdue for TDAP shot. Administered prior to leaving. Deferred HIV screening.

## 2018-01-06 NOTE — Progress Notes (Signed)
   HPI 22 year old who presents for annual physical. She has no complaints aside from a rash on her right upper chest. She states that this is the area that she usually has her name tag on. 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 She can become quite sweaty at work. The rash has been there for a couple of weeks. She says that she  Initially thought they were "heat bumps" but since they never resolved she decided to get them looked.   She also in interested in getting tested for adhd. She states that multiple people have told her that her attention wanders. She states that her attention issues primarily occur when she has a lot on her plate.  CC: annual physical   ROS:  Review of Systems See HPI for ROS.   CC, SH/smoking status, and VS noted  Objective: BP 112/60   Pulse 71   Temp 98.1 F (36.7 C) (Oral)   Ht 5\' 5"  (1.651 m)   Wt 172 lb 3.2 oz (78.1 kg)   LMP 12/31/2017 (Approximate)   SpO2 98%   BMI 28.66 kg/m  Gen: NAD, alert, cooperative, and pleasant. Well appearing, 22 year old, resting comfortably in chair HEENT: NCAT, EOMI, PERRL CV: RRR, no murmur Resp: CTAB, no wheezes, non-labored Abd: SNTND, BS present, no guarding or organomegaly Ext: No edema, warm Neuro: Alert and oriented, Speech clear, No gross deficits Derm: Multiple raised nodules with black center above right breast   Assessment and plan:  Tinea corporis Given the chronic moisture in the area from sweating and indeterminate skin finding on exam, I will treat for possible tinea corporis.  - Econazole nitrate 1% apply daily to area until resolution  Encounter for wellness examination Patient overdue for TDAP shot. Administered prior to leaving. Deferred HIV screening.  Vaccine for diphtheria-tetanus-pertussis, combined TDAP shot given at this visit  ADHD (attention deficit hyperactivity disorder) evaluation Will defer to psychology for ADHD testing.  Referral placed,   Orders Placed This Encounter  Procedures  . Tdap vaccine greater than or equal to 7yo IM  . Ambulatory referral to Psychology    Referral Priority:   Routine    Referral Type:   Psychiatric    Referral Reason:   Specialty Services Required    Requested Specialty:   Psychology    Number of Visits Requested:   1    Meds ordered this encounter  Medications  . econazole nitrate 1 % cream    Sig: Apply topically daily.    Dispense:  15 g    Refill:  0     Myrene BuddyJacob Mennie Spiller MD PGY-1 Family Medicine Resident  01/06/2018 11:13 AM

## 2018-01-06 NOTE — Assessment & Plan Note (Signed)
Will defer to psychology for ADHD testing. Referral placed,

## 2018-01-06 NOTE — Assessment & Plan Note (Signed)
TDAP shot given at this visit

## 2018-04-04 ENCOUNTER — Telehealth: Payer: Self-pay | Admitting: Family Medicine

## 2018-04-04 DIAGNOSIS — Z7689 Persons encountering health services in other specified circumstances: Secondary | ICD-10-CM

## 2018-04-04 NOTE — Telephone Encounter (Signed)
Pt would like to know if Dr. Primitivo Gauze could place a referral for her to see an obgyn. She asked if possible she would like to see a female provider but if it is not possible she understands. Pt was last seen in July and Dr. Primitivo Gauze does not have another appointment available until November, patient was hoping to have this done without waiting to be seen in November. She would like someone to call her at [phone] to let her know if this can be done.

## 2018-04-05 NOTE — Telephone Encounter (Signed)
Family medicine progress note  Made referral to establish care with ob/gyn. Requested female provider if possible.Myrene Buddy MD PGY-2 Family Medicine Resident

## 2018-04-18 ENCOUNTER — Ambulatory Visit: Payer: BLUE CROSS/BLUE SHIELD | Admitting: Psychology

## 2018-04-26 ENCOUNTER — Other Ambulatory Visit: Payer: Self-pay

## 2018-04-26 ENCOUNTER — Other Ambulatory Visit (HOSPITAL_COMMUNITY)
Admission: RE | Admit: 2018-04-26 | Discharge: 2018-04-26 | Disposition: A | Discharge status: Home / Self Care | Payer: BLUE CROSS/BLUE SHIELD | Source: Ambulatory Visit | Attending: Family Medicine | Admitting: Family Medicine

## 2018-04-26 ENCOUNTER — Encounter: Payer: Self-pay | Admitting: Family Medicine

## 2018-04-26 ENCOUNTER — Ambulatory Visit (INDEPENDENT_AMBULATORY_CARE_PROVIDER_SITE_OTHER): Payer: BLUE CROSS/BLUE SHIELD | Admitting: Family Medicine

## 2018-04-26 VITALS — BP 100/60 | HR 88 | Temp 98.4°F | Wt 171.0 lb

## 2018-04-26 DIAGNOSIS — N760 Acute vaginitis: Secondary | ICD-10-CM | POA: Diagnosis not present

## 2018-04-26 DIAGNOSIS — B9689 Other specified bacterial agents as the cause of diseases classified elsewhere: Secondary | ICD-10-CM | POA: Diagnosis not present

## 2018-04-26 DIAGNOSIS — Z202 Contact with and (suspected) exposure to infections with a predominantly sexual mode of transmission: Secondary | ICD-10-CM | POA: Diagnosis not present

## 2018-04-26 LAB — POCT WET PREP (WET MOUNT)
CLUE CELLS WET PREP WHIFF POC: POSITIVE
TRICHOMONAS WET PREP HPF POC: ABSENT

## 2018-04-26 MED ORDER — METRONIDAZOLE 500 MG PO TABS: 500.0000 mg | ORAL_TABLET | Freq: Two times a day (BID) | ORAL | 0 refills | Status: AC

## 2018-04-26 NOTE — Patient Instructions (Signed)
Bacterial Vaginosis Bacterial vaginosis is a vaginal infection that occurs when the normal balance of bacteria in the vagina is disrupted. It results from an overgrowth of certain bacteria. This is the most common vaginal infection among women ages 15-44. Because bacterial vaginosis increases your risk for STIs (sexually transmitted infections), getting treated can help reduce your risk for chlamydia, gonorrhea, herpes, and HIV (human immunodeficiency virus). Treatment is also important for preventing complications in pregnant women, because this condition can cause an early (premature) delivery. What are the causes? This condition is caused by an increase in harmful bacteria that are normally present in small amounts in the vagina. However, the reason that the condition develops is not fully understood. What increases the risk? The following factors may make you more likely to develop this condition:  Having a new sexual partner or multiple sexual partners.  Having unprotected sex.  Douching.  Having an intrauterine device (IUD).  Smoking.  Drug and alcohol abuse.  Taking certain antibiotic medicines.  Being pregnant.  You cannot get bacterial vaginosis from toilet seats, bedding, swimming pools, or contact with objects around you. What are the signs or symptoms? Symptoms of this condition include:  Grey or white vaginal discharge. The discharge can also be watery or foamy.  A fish-like odor with discharge, especially after sexual intercourse or during menstruation.  Itching in and around the vagina.  Burning or pain with urination.  Some women with bacterial vaginosis have no signs or symptoms. How is this diagnosed? This condition is diagnosed based on:  Your medical history.  A physical exam of the vagina.  Testing a sample of vaginal fluid under a microscope to look for a large amount of bad bacteria or abnormal cells. Your health care provider may use a cotton swab  or a small wooden spatula to collect the sample.  How is this treated? This condition is treated with antibiotics. These may be given as a pill, a vaginal cream, or a medicine that is put into the vagina (suppository). If the condition comes back after treatment, a second round of antibiotics may be needed. Follow these instructions at home: Medicines  Take over-the-counter and prescription medicines only as told by your health care provider.  Take or use your antibiotic as told by your health care provider. Do not stop taking or using the antibiotic even if you start to feel better. General instructions  If you have a female sexual partner, tell her that you have a vaginal infection. She should see her health care provider and be treated if she has symptoms. If you have a female sexual partner, he does not need treatment.  During treatment: ? Avoid sexual activity until you finish treatment. ? Do not douche. ? Avoid alcohol as directed by your health care provider. ? Avoid breastfeeding as directed by your health care provider.  Drink enough water and fluids to keep your urine clear or pale yellow.  Keep the area around your vagina and rectum clean. ? Wash the area daily with warm water. ? Wipe yourself from front to back after using the toilet.  Keep all follow-up visits as told by your health care provider. This is important. How is this prevented?  Do not douche.  Wash the outside of your vagina with warm water only.  Use protection when having sex. This includes latex condoms and dental dams.  Limit how many sexual partners you have. To help prevent bacterial vaginosis, it is best to have sex with just   one partner (monogamous).  Make sure you and your sexual partner are tested for STIs.  Wear cotton or cotton-lined underwear.  Avoid wearing tight pants and pantyhose, especially during summer.  Limit the amount of alcohol that you drink.  Do not use any products that  contain nicotine or tobacco, such as cigarettes and e-cigarettes. If you need help quitting, ask your health care provider.  Do not use illegal drugs. Where to find more information:  Centers for Disease Control and Prevention: www.cdc.gov/std  American Sexual Health Association (ASHA): www.ashastd.org  U.S. Department of Health and Human Services, Office on Women's Health: www.womenshealth.gov/ or https://www.womenshealth.gov/a-z-topics/bacterial-vaginosis Contact a health care provider if:  Your symptoms do not improve, even after treatment.  You have more discharge or pain when urinating.  You have a fever.  You have pain in your abdomen.  You have pain during sex.  You have vaginal bleeding between periods. Summary  Bacterial vaginosis is a vaginal infection that occurs when the normal balance of bacteria in the vagina is disrupted.  Because bacterial vaginosis increases your risk for STIs (sexually transmitted infections), getting treated can help reduce your risk for chlamydia, gonorrhea, herpes, and HIV (human immunodeficiency virus). Treatment is also important for preventing complications in pregnant women, because the condition can cause an early (premature) delivery.  This condition is treated with antibiotic medicines. These may be given as a pill, a vaginal cream, or a medicine that is put into the vagina (suppository). This information is not intended to replace advice given to you by your health care provider. Make sure you discuss any questions you have with your health care provider. Document Released: 06/21/2005 Document Revised: 10/25/2016 Document Reviewed: 03/06/2016 Elsevier Interactive Patient Education  2018 Elsevier Inc.  

## 2018-04-26 NOTE — Progress Notes (Signed)
    Subjective:  Olivia Hayes is a 22 y.o. female who presents to the [facility] today with a chief complaint of vaginal discharge.   HPI:  Having vaginal discharge for 1-1.5wks, whitish, profuse, itchy. Slightly malodorous. Boyfriend was told he had a yeast infection. She has had 2 recent sexual partners, both men. Would like to be tested for STDs No urinary symptoms.  ROS: Per HPI   Objective:  Physical Exam: BP 100/60   Pulse 88   Temp 98.4 F (36.9 C) (Oral)   Wt 171 lb (77.6 kg)   LMP 04/23/2018   SpO2 98%   BMI 28.46 kg/m   Gen: NAD, resting comfortably Pelvic exam: normal external genitalia, vulva, vagina, cervix, uterus and adnexa. Skin: warm, dry Neuro: grossly normal, moves all extremities Psych: Normal affect and thought content  Results for orders placed or performed in visit on 04/26/18 (from the past 72 hour(s))  POCT Wet Prep [facility])     Status: Abnormal   Collection Time: 04/26/18  4:25 PM  Result Value Ref Range   Source Wet Prep POC VAG    WBC, Wet Prep HPF POC 1-5    Bacteria Wet Prep HPF POC Moderate (A) Few   Clue Cells Wet Prep HPF POC Moderate (A) None   Clue Cells Wet Prep Whiff POC Positive Whiff    Yeast Wet Prep HPF POC Few (A) None   Trichomonas Wet Prep HPF POC Absent Absent     Assessment/Plan:  1. BV (bacterial vaginosis) Wet prep showing BV. Treat with flagyl. - metroNIDAZOLE (FLAGYL) 500 MG tablet; Take 1 tablet (500 mg total) by mouth 2 (two) times daily for 7 days.  Dispense: 14 tablet; Refill: 0  2. Possible exposure to STD Pending GC/CT which was collectedtoday.  - Cervicovaginal ancillary only   Leland Her, DO PGY-3, [facility] 04/26/2018 4:20 PM

## 2018-04-27 ENCOUNTER — Encounter: Payer: Self-pay | Admitting: Family Medicine

## 2018-04-27 LAB — CERVICOVAGINAL ANCILLARY ONLY
CHLAMYDIA, DNA PROBE: NEGATIVE
Neisseria Gonorrhea: NEGATIVE

## 2018-05-08 ENCOUNTER — Encounter: Payer: Self-pay | Admitting: Women's Health

## 2018-05-08 ENCOUNTER — Ambulatory Visit: Payer: BLUE CROSS/BLUE SHIELD | Admitting: Women's Health

## 2018-05-08 VITALS — BP 122/80 | Ht 65.0 in | Wt 172.0 lb

## 2018-05-08 DIAGNOSIS — Z113 Encounter for screening for infections with a predominantly sexual mode of transmission: Secondary | ICD-10-CM

## 2018-05-08 DIAGNOSIS — Z01419 Encounter for gynecological examination (general) (routine) without abnormal findings: Secondary | ICD-10-CM

## 2018-05-08 MED ORDER — DROSPIRENONE-ETHINYL ESTRADIOL 3-0.02 MG PO TABS: 1.0000 | ORAL_TABLET | Freq: Every day | ORAL | 4 refills | Status: DC

## 2018-05-08 NOTE — Progress Notes (Signed)
Olivia Hayes [birth year] [id number]    History:    Presents for new patient annual exam desiring contraception.  New partner in the past month has not used condoms consistently.  Was treated for BV and had negative GC/chlamydia 04/26/2018.  Normal Pap 10/2017.  Gardasil series completed in 08.  Regular monthly cycle.    Past medical history, past surgical history, family history and social history were all reviewed and documented in the EPIC chart.  Attending school [employer] and working part-time at [employer].  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  Parents healthy.  ROS:  A ROS was performed and pertinent positives and negatives are included.  Exam:  Vitals:   05/08/18 1222  BP: 122/80  Weight: 172 lb (78 kg)  Height: 5\' 5"  (1.651 m)   Body mass index is 28.62 kg/m.   General appearance:  Normal Thyroid:  Symmetrical, normal in size, without palpable masses or nodularity. Respiratory  Auscultation:  Clear without wheezing or rhonchi Cardiovascular  Auscultation:  Regular rate, without rubs, murmurs or gallops  Edema/varicosities:  Not grossly evident Abdominal  Soft,nontender, without masses, guarding or rebound.  Liver/spleen:  No organomegaly noted  Hernia:  None appreciated  Skin  Inspection:  Grossly normal   Breasts: Examined lying and sitting.     Right: Without masses, retractions, discharge or axillary adenopathy.     Left: Without masses, retractions, discharge or axillary adenopathy. Gentitourinary   Inguinal/mons:  Normal without inguinal adenopathy  External genitalia:  Normal  BUS/Urethra/Skene's glands:  Normal  Vagina:  Normal  Cervix:  Normal  Uterus:  normal in size, shape and contour.  Midline and mobile  Adnexa/parametria:     Rt: Without masses or tenderness.   Lt: Without masses or tenderness.  Anus and perineum: Normal   Assessment/Plan:  22 y.o. S W[place] G0 for annual exam desiring contraception with no complaints..  Contraception management STD screen  Plan:   contraception options reviewed.  Prefers a pill.  Yaz prescription, proper use, slight risk for blood clots and strokes reviewed.  Reviewed may help with acne, start up instructions reviewed, reviewed importance of condoms especially first month or abstinence.  States is planning to take Plan B emergency contraception today had unprotected intercourse yesterday.  SBE's, exercise, calcium rich foods, MVI daily encouraged.  Campus safety reviewed.  Encouraged to decrease calorie/carbs.  CBC,  HIV, hep B, C, RPR.(GC/chlamydia neg 04/2018) pap normal 10/2017, new screening guidelines reviewed.    Harrington Challenger [facility], 1:06 PM 05/08/2018

## 2018-05-08 NOTE — Patient Instructions (Addendum)
Levonorgestrel intrauterine device (IUD) What is this medicine? LEVONORGESTREL IUD (LEE voe nor jes trel) is a contraceptive (birth control) device. The device is placed inside the uterus by a healthcare professional. It is used to prevent pregnancy. This device can also be used to treat heavy bleeding that occurs during your period. This medicine may be used for other purposes; ask your health care provider or pharmacist if you have questions. COMMON BRAND NAME(S): Minette Headland What should I tell my health care provider before I take this medicine? They need to know if you have any of these conditions: -abnormal Pap smear -cancer of the breast, uterus, or cervix -diabetes -endometritis -genital or pelvic infection now or in the past -have more than one sexual partner or your partner has more than one partner -heart disease -history of an ectopic or tubal pregnancy -immune system problems -IUD in place -liver disease or tumor -problems with blood clots or take blood-thinners -seizures -use intravenous drugs -uterus of unusual shape -vaginal bleeding that has not been explained -an unusual or allergic reaction to levonorgestrel, other hormones, silicone, or polyethylene, medicines, foods, dyes, or preservatives -pregnant or trying to get pregnant -breast-feeding How should I use this medicine? This device is placed inside the uterus by a health care professional. Talk to your pediatrician regarding the use of this medicine in children. Special care may be needed. Overdosage: If you think you have taken too much of this medicine contact a poison control center or emergency room at once. NOTE: This medicine is only for you. Do not share this medicine with others. What if I miss a dose? This does not apply. Depending on the brand of device you have inserted, the device will need to be replaced every 3 to 5 years if you wish to continue using this type of birth  control. What may interact with this medicine? Do not take this medicine with any of the following medications: -amprenavir -bosentan -fosamprenavir This medicine may also interact with the following medications: -aprepitant -armodafinil -barbiturate medicines for inducing sleep or treating seizures -bexarotene -boceprevir -griseofulvin -medicines to treat seizures like carbamazepine, ethotoin, felbamate, oxcarbazepine, phenytoin, topiramate -modafinil -pioglitazone -rifabutin -rifampin -rifapentine -some medicines to treat HIV infection like atazanavir, efavirenz, indinavir, lopinavir, nelfinavir, tipranavir, ritonavir -St. John's wort -warfarin This list may not describe all possible interactions. Give your health care provider a list of all the medicines, herbs, non-prescription drugs, or dietary supplements you use. Also tell them if you smoke, drink alcohol, or use illegal drugs. Some items may interact with your medicine. What should I watch for while using this medicine? Visit your doctor or health care professional for regular check ups. See your doctor if you or your partner has sexual contact with others, becomes HIV positive, or gets a sexual transmitted disease. This product does not protect you against HIV infection (AIDS) or other sexually transmitted diseases. You can check the placement of the IUD yourself by reaching up to the top of your vagina with clean fingers to feel the threads. Do not pull on the threads. It is a good habit to check placement after each menstrual period. Call your doctor right away if you feel more of the IUD than just the threads or if you cannot feel the threads at all. The IUD may come out by itself. You may become pregnant if the device comes out. If you notice that the IUD has come out use a backup birth control method like condoms and call your  health care provider. Using tampons will not change the position of the IUD and are okay to use  during your period. This IUD can be safely scanned with magnetic resonance imaging (MRI) only under specific conditions. Before you have an MRI, tell your healthcare provider that you have an IUD in place, and which type of IUD you have in place. What side effects may I notice from receiving this medicine? Side effects that you should report to your doctor or health care professional as soon as possible: -allergic reactions like skin rash, itching or hives, swelling of the face, lips, or tongue -fever, flu-like symptoms -genital sores -high blood pressure -no menstrual period for 6 weeks during use -pain, swelling, warmth in the leg -pelvic pain or tenderness -severe or sudden headache -signs of pregnancy Health Maintenance, Female Adopting a healthy lifestyle and getting preventive care can go a long way to promote health and wellness. Talk with your health care provider about what schedule of regular examinations is right for you. This is a good chance for you to check in with your provider about disease prevention and staying healthy. In between checkups, there are plenty of things you can do on your own. Experts have done a lot of research about which lifestyle changes and preventive measures are most likely to keep you healthy. Ask your health care provider for more information. Weight and diet Eat a healthy diet  Be sure to include plenty of vegetables, fruits, low-fat dairy products, and lean protein.  Do not eat a lot of foods high in solid fats, added sugars, or salt.  Get regular exercise. This is one of the most important things you can do for your health. ? Most adults should exercise for at least 150 minutes each week. The exercise should increase your heart rate and make you sweat (moderate-intensity exercise). ? Most adults should also do strengthening exercises at least twice a week. This is in addition to the moderate-intensity exercise.  Maintain a healthy weight  Body  mass index (BMI) is a measurement that can be used to identify possible weight problems. It estimates body fat based on height and weight. Your health care provider can help determine your BMI and help you achieve or maintain a healthy weight.  For females 11 years of age and older: ? A BMI below 18.5 is considered underweight. ? A BMI of 18.5 to 24.9 is normal. ? A BMI of 25 to 29.9 is considered overweight. ? A BMI of 30 and above is considered obese.  Watch levels of cholesterol and blood lipids  You should start having your blood tested for lipids and cholesterol at 21 years of age, then have this test every 5 years.  You may need to have your cholesterol levels checked more often if: ? Your lipid or cholesterol levels are high. ? You are older than 21 years of age. ? You are at high risk for heart disease.  Cancer screening Lung Cancer  Lung cancer screening is recommended for adults 42-96 years old who are at high risk for lung cancer because of a history of smoking.  A yearly low-dose CT scan of the lungs is recommended for people who: ? Currently smoke. ? Have quit within the past 15 years. ? Have at least a 30-pack-year history of smoking. A pack year is smoking an average of one pack of cigarettes a day for 1 year.  Yearly screening should continue until it has been 15 years since you quit.  Yearly screening should stop if you develop a health problem that would prevent you from having lung cancer treatment.  Breast Cancer  Practice breast self-awareness. This means understanding how your breasts normally appear and feel.  It also means doing regular breast self-exams. Let your health care provider know about any changes, no matter how small.  If you are in your 20s or 30s, you should have a clinical breast exam (CBE) by a health care provider every 1-3 years as part of a regular health exam.  If you are 2 or older, have a CBE every year. Also consider having a  breast X-ray (mammogram) every year.  If you have a family history of breast cancer, talk to your health care provider about genetic screening.  If you are at high risk for breast cancer, talk to your health care provider about having an MRI and a mammogram every year.  Breast cancer gene (BRCA) assessment is recommended for women who have family members with BRCA-related cancers. BRCA-related cancers include: ? Breast. ? Ovarian. ? Tubal. ? Peritoneal cancers.  Results of the assessment will determine the need for genetic counseling and BRCA1 and BRCA2 testing.  Cervical Cancer Your health care provider may recommend that you be screened regularly for cancer of the pelvic organs (ovaries, uterus, and vagina). This screening involves a pelvic examination, including checking for microscopic changes to the surface of your cervix (Pap test). You may be encouraged to have this screening done every 3 years, beginning at age 38.  For women ages 81-65, health care providers may recommend pelvic exams and Pap testing every 3 years, or they may recommend the Pap and pelvic exam, combined with testing for human papilloma virus (HPV), every 5 years. Some types of HPV increase your risk of cervical cancer. Testing for HPV may also be done on women of any age with unclear Pap test results.  Other health care providers may not recommend any screening for nonpregnant women who are considered low risk for pelvic cancer and who do not have symptoms. Ask your health care provider if a screening pelvic exam is right for you.  If you have had past treatment for cervical cancer or a condition that could lead to cancer, you need Pap tests and screening for cancer for at least 20 years after your treatment. If Pap tests have been discontinued, your risk factors (such as having a new sexual partner) need to be reassessed to determine if screening should resume. Some women have medical problems that increase the chance  of getting cervical cancer. In these cases, your health care provider may recommend more frequent screening and Pap tests.  Colorectal Cancer  This type of cancer can be detected and often prevented.  Routine colorectal cancer screening usually begins at 21 years of age and continues through 21 years of age.  Your health care provider may recommend screening at an earlier age if you have risk factors for colon cancer.  Your health care provider may also recommend using home test kits to check for hidden blood in the stool.  A small camera at the end of a tube can be used to examine your colon directly (sigmoidoscopy or colonoscopy). This is done to check for the earliest forms of colorectal cancer.  Routine screening usually begins at age 1.  Direct examination of the colon should be repeated every 5-10 years through 21 years of age. However, you may need to be screened more often if early forms of precancerous  polyps or small growths are found.  Skin Cancer  Check your skin from head to toe regularly.  Tell your health care provider about any new moles or changes in moles, especially if there is a change in a mole's shape or color.  Also tell your health care provider if you have a mole that is larger than the size of a pencil eraser.  Always use sunscreen. Apply sunscreen liberally and repeatedly throughout the day.  Protect yourself by wearing long sleeves, pants, a wide-brimmed hat, and sunglasses whenever you are outside.  Heart disease, diabetes, and high blood pressure  High blood pressure causes heart disease and increases the risk of stroke. High blood pressure is more likely to develop in: ? People who have blood pressure in the high end of the normal range (130-139/85-89 mm Hg). ? People who are overweight or obese. ? People who are African American.  If you are 61-26 years of age, have your blood pressure checked every 3-5 years. If you are 106 years of age or older,  have your blood pressure checked every year. You should have your blood pressure measured twice-once when you are at a hospital or clinic, and once when you are not at a hospital or clinic. Record the average of the two measurements. To check your blood pressure when you are not at a hospital or clinic, you can use: ? An automated blood pressure machine at a pharmacy. ? A home blood pressure monitor.  If you are between 37 years and 9 years old, ask your health care provider if you should take aspirin to prevent strokes.  Have regular diabetes screenings. This involves taking a blood sample to check your fasting blood sugar level. ? If you are at a normal weight and have a low risk for diabetes, have this test once every three years after 21 years of age. ? If you are overweight and have a high risk for diabetes, consider being tested at a younger age or more often. Preventing infection Hepatitis B  If you have a higher risk for hepatitis B, you should be screened for this virus. You are considered at high risk for hepatitis B if: ? You were born in a country where hepatitis B is common. Ask your health care provider which countries are considered high risk. ? Your parents were born in a high-risk country, and you have not been immunized against hepatitis B (hepatitis B vaccine). ? You have HIV or AIDS. ? You use needles to inject street drugs. ? You live with someone who has hepatitis B. ? You have had sex with someone who has hepatitis B. ? You get hemodialysis treatment. ? You take certain medicines for conditions, including cancer, organ transplantation, and autoimmune conditions.  Hepatitis C  Blood testing is recommended for: ? Everyone born from [birth year] through 1964. ? Anyone with known risk factors for hepatitis C.  Sexually transmitted infections (STIs)  You should be screened for sexually transmitted infections (STIs) including gonorrhea and chlamydia if: ? You are sexually  active and are younger than 21 years of age. ? You are older than 21 years of age and your health care provider tells you that you are at risk for this type of infection. ? Your sexual activity has changed since you were last screened and you are at an increased risk for chlamydia or gonorrhea. Ask your health care provider if you are at risk.  If you do not have HIV, but are at  risk, it may be recommended that you take a prescription medicine daily to prevent HIV infection. This is called pre-exposure prophylaxis (PrEP). You are considered at risk if: ? You are sexually active and do not regularly use condoms or know the HIV status of your partner(s). ? You take drugs by injection. ? You are sexually active with a partner who has HIV.  Talk with your health care provider about whether you are at high risk of being infected with HIV. If you choose to begin PrEP, you should first be tested for HIV. You should then be tested every 3 months for as long as you are taking PrEP. Pregnancy  If you are premenopausal and you may become pregnant, ask your health care provider about preconception counseling.  If you may become pregnant, take 400 to 800 micrograms (mcg) of folic acid every day.  If you want to prevent pregnancy, talk to your health care provider about birth control (contraception). Osteoporosis and menopause  Osteoporosis is a disease in which the bones lose minerals and strength with aging. This can result in serious bone fractures. Your risk for osteoporosis can be identified using a bone density scan.  If you are 63 years of age or older, or if you are at risk for osteoporosis and fractures, ask your health care provider if you should be screened.  Ask your health care provider whether you should take a calcium or vitamin D supplement to lower your risk for osteoporosis.  Menopause may have certain physical symptoms and risks.  Hormone replacement therapy may reduce some of these  symptoms and risks. Talk to your health care provider about whether hormone replacement therapy is right for you. Follow these instructions at home:  Schedule regular health, dental, and eye exams.  Stay current with your immunizations.  Do not use any tobacco products including cigarettes, chewing tobacco, or electronic cigarettes.  If you are pregnant, do not drink alcohol.  If you are breastfeeding, limit how much and how often you drink alcohol.  Limit alcohol intake to no more than 1 drink per day for nonpregnant women. One drink equals 12 ounces of beer, 5 ounces of wine, or 1 ounces of hard liquor.  Do not use street drugs.  Do not share needles.  Ask your health care provider for help if you need support or information about quitting drugs.  Tell your health care provider if you often feel depressed.  Tell your health care provider if you have ever been abused or do not feel safe at home. This information is not intended to replace advice given to you by your health care provider. Make sure you discuss any questions you have with your health care provider. Document Released: 01/04/2011 Document Revised: 11/27/2015 Document Reviewed: 03/25/2015 Elsevier Interactive Patient Education  2018 [employer]. -stomach cramping -sudden shortness of breath -trouble with balance, talking, or walking -unusual vaginal bleeding, discharge -yellowing of the eyes or skin Side effects that usually do not require medical attention (report to your doctor or health care professional if they continue or are bothersome): -acne -breast pain -change in sex drive or performance -changes in weight -cramping, dizziness, or faintness while the device is being inserted -headache -irregular menstrual bleeding within first 3 to 6 months of use -nausea This list may not describe all possible side effects. Call your doctor for medical advice about side effects. You may report side effects to FDA  at 1-800-FDA-1088. Where should I keep my medicine? This does not  apply. NOTE: This sheet is a summary. It may not cover all possible information. If you have questions about this medicine, talk to your doctor, pharmacist, or health care provider.  2018 Elsevier/Gold Standard (2016-04-02 14:14:56)

## 2018-05-09 LAB — RPR: RPR: NONREACTIVE

## 2018-05-09 LAB — HEPATITIS C ANTIBODY
Hepatitis C Ab: NONREACTIVE
SIGNAL TO CUT-OFF: 0.01 (ref <1.00)

## 2018-05-09 LAB — CBC WITH DIFFERENTIAL/PLATELET
BASOS ABS: 44 {cells}/uL (ref 0–200)
Basophils Relative: 0.5 %
EOS ABS: 122 {cells}/uL (ref 15–500)
Eosinophils Relative: 1.4 %
HCT: 45.8 % — ABNORMAL HIGH (ref 35.0–45.0)
HEMOGLOBIN: 15.5 g/dL (ref 11.7–15.5)
Lymphs Abs: 1984 cells/uL (ref 850–3900)
MCH: 29.3 pg (ref 27.0–33.0)
MCHC: 33.8 g/dL (ref 32.0–36.0)
MCV: 86.6 fL (ref 80.0–100.0)
MONOS PCT: 7.1 %
MPV: 11.1 fL (ref 7.5–12.5)
NEUTROS ABS: 5933 {cells}/uL (ref 1500–7800)
Neutrophils Relative %: 68.2 %
Platelets: 286 10*3/uL (ref 140–400)
RBC: 5.29 10*6/uL — ABNORMAL HIGH (ref 3.80–5.10)
RDW: 13.3 % (ref 11.0–15.0)
Total Lymphocyte: 22.8 %
WBC mixed population: 618 cells/uL (ref 200–950)
WBC: 8.7 10*3/uL (ref 3.8–10.8)

## 2018-05-09 LAB — HEPATITIS B SURFACE ANTIGEN: Hepatitis B Surface Ag: NONREACTIVE

## 2018-05-09 LAB — HIV ANTIBODY (ROUTINE TESTING W REFLEX): HIV: NONREACTIVE

## 2018-07-18 ENCOUNTER — Other Ambulatory Visit: Payer: Self-pay

## 2018-07-18 ENCOUNTER — Ambulatory Visit (INDEPENDENT_AMBULATORY_CARE_PROVIDER_SITE_OTHER): Payer: BLUE CROSS/BLUE SHIELD | Admitting: Family Medicine

## 2018-07-18 ENCOUNTER — Telehealth: Payer: Self-pay | Admitting: Family Medicine

## 2018-07-18 ENCOUNTER — Encounter: Payer: Self-pay | Admitting: Family Medicine

## 2018-07-18 ENCOUNTER — Ambulatory Visit (HOSPITAL_COMMUNITY)
Admission: RE | Admit: 2018-07-18 | Discharge: 2018-07-18 | Disposition: A | Discharge status: Home / Self Care | Payer: BLUE CROSS/BLUE SHIELD | Source: Ambulatory Visit | Attending: Family Medicine | Admitting: Family Medicine

## 2018-07-18 VITALS — BP 108/68 | HR 91 | Temp 98.9°F | Wt 163.0 lb

## 2018-07-18 DIAGNOSIS — M7989 Other specified soft tissue disorders: Secondary | ICD-10-CM

## 2018-07-18 DIAGNOSIS — Z3202 Encounter for pregnancy test, result negative: Secondary | ICD-10-CM | POA: Diagnosis not present

## 2018-07-18 DIAGNOSIS — I82431 Acute embolism and thrombosis of right popliteal vein: Secondary | ICD-10-CM

## 2018-07-18 LAB — POCT URINE PREGNANCY: PREG TEST UR: NEGATIVE

## 2018-07-18 MED ORDER — RIVAROXABAN 20 MG PO TABS: 20.0000 mg | ORAL_TABLET | Freq: Every day | ORAL | 1 refills | Status: DC

## 2018-07-18 MED ORDER — RIVAROXABAN (XARELTO) VTE STARTER PACK (15 & 20 MG): ORAL_TABLET | ORAL | 0 refills | Status: DC

## 2018-07-18 NOTE — Telephone Encounter (Signed)
Called patient regarding results of positive right lower extremity DVT ultrasound.  She has a DVT in her right popliteal vein.  No more proximal thrombus detected.  Negative left side.  Called patient.  Discussed results at length.  All questions were answered.  Recommended starting anticoagulation therapy.  Discussed options including Eliquis or Xarelto.  The patient elected to take Xarelto.  She regularly eats a fatty meal.  Kidney function and CBC are pending.  Reviewed strict ED return precautions at length including signs or symptoms of pulmonary embolism or progression of deep venous thrombosis.  Provoked DVT in recent setting of starting combined oral hormonal contraceptives with drospirenone, which is associated with a higher risk of venous thromboembolism.  Will require 3 months of anticoagulation.  We reviewed the dosing including the initial 21 days of twice daily dosing followed by daily dosing.  The patient is very concerned about long-term harms from this.  We discussed possible increased risk of venous thromboembolism during pregnancy, may consider consultation with high-risk obstetrics for preconception planning before she becomes pregnant.  Options for contraception going forward include Mirena, copper IUD, Depo-Provera, progestin only pills, or Nexplanon.

## 2018-07-18 NOTE — Progress Notes (Signed)
Right lower extremity venous duplex has been completed. There is evidence of acute deep vein thrombosis involving the popliteal vein of the right lower extremity. There is also evidence of acute superficial vein thrombosis involving the lesser saphenous vein of the right lower extremity. Results were given to Dr. Manson Passey.  07/18/18 1:47 PM Olen Cordial RVT

## 2018-07-18 NOTE — Progress Notes (Signed)
Patient Name: Talma Lively Date of Birth: 1995/10/21 Date of Visit: 07/18/18 PCP: Myrene Buddy, MD  Chief Complaint: right leg swelling   Subjective: Lenox Magyar is a pleasant 23 y.o. year old woman with history of acne, ADHD, and overweight status presents today for evaluation of right leg swelling and pain.  She reports 3-4 days of right leg pain and swelling, over the calf. She lifts weights and squats quite a bit---pain has limited her ability to do this. She recently started Yaz approximately 2 months ago. She has no family history of VTE. She is a non-smoker. No recent travel. She is sexually active with 1 partner. She specifically denies chest pain, palpitations, dyspnea, fatigue, weakness, dizziness upon standing. She additionally reports on and off headaches--these have worsened with OCP. No fevers, redness, injury, change in lifting activity, trauma to knee or leg.  She is sexually active with 1 partner. She has dyspareunia, which limits ability to use condoms she reports. She has not tried an alternative contraceptive option.   ROS:  ROS As above.  I have reviewed the patient's medical, surgical, family, and social history as appropriate.   Vitals:   07/18/18 1024  BP: 108/68  Pulse: 91  Temp: 98.9 F (37.2 C)  SpO2: 97%   Filed Weights   07/18/18 1024  Weight: 163 lb (73.9 kg)   HEENT: Sclera anicteric. Dentition is moderate. Appears well hydrated. Neck: Supple Cardiac: Regular rate and rhythm. Normal S1/S2. No murmurs, rubs, or gallops appreciated. Lungs: Clear bilaterally to ascultation.  Extremities: Warm,  41 cm on right, 37 cm on left Pitting edema of right leg from ankle to mid knee  Knee without warmth No redness  Skin: Warm, dry Psych: Pleasant and appropriate    Nesiah was seen today for right leg pain.  Diagnoses and all orders for this visit:  Right leg swelling, most consistent with DVT. No identifiable cellulitis, knee without effusion,  ankle joint benign appearing--tenderness throughout posterior calf with edema and swelling in setting of new OCP use with drosperinone.  Patient is the patient is well-appearing, does not have tachycardia, chest pain, palpitations, dyspnea on exertion.  Her oxygen level is normal.  She has excellent family support she.  She has access to medications.  I do not suspect she has a coexisting pulmonary embolism at this time.  I did offer the family immediate evaluation in the emergency department, also discussed evaluation with ultrasound today and starting anticoagulation if positive today.  Wells score is 4.  DVT ultrasound ordered and scheduled for today.  Cell phone number of patient as below.  Reviewed strict return precautions and reasons to present to the emergency department.  She is to stop her oral contraceptive pills -     VAS [place] LOWER EXTREMITY VENOUS (DVT); Future -     POCT urine pregnancy -     CBC -     Basic Metabolic Panel  Contraceptive options, pregnancy test negative, we reviewed possible options including Nexplanon, Depo-Provera Mirena.  The patient is most interested in Nexplanon.  Will discuss more at follow-up  Leg pain discussed with patient that if ultrasound is positive for DVT she cannot use NSAIDs.  I recommended instead she use regularly scheduled Tylenol and ice.  Reviewed that the anticoagulant if there is indeed a blood clot will not immediately improve her pain but work slowly over time as her body dissolve the clot.  Cell phone  [phone]  Terisa Starr, MD  Family Medicine Teaching  Service

## 2018-07-18 NOTE — Patient Instructions (Addendum)
Your ultrasound is at 1 PM today Go to [facility] [address] [place], [place], [place] [address]   You can take only TYLENOL for pain  I will call you with the ultrasound results today  Please stop your birth control pill immediately.  I will call you with the results of your blood work  If you develop worsening leg pain, difficulty breathing, palpitations or chest pain please present to the emergency department immediately   It was wonderful to see you today.  Thank you for choosing [facility].   Please call [phone] with any questions about today's appointment.  Please be sure to schedule follow up at the front  desk before you leave today.   Terisa Starr, MD  Family Medicine

## 2018-07-19 LAB — CBC
Hematocrit: 43.3 % (ref 34.0–46.6)
Hemoglobin: 14.6 g/dL (ref 11.1–15.9)
MCH: 29.4 pg (ref 26.6–33.0)
MCHC: 33.7 g/dL (ref 31.5–35.7)
MCV: 87 fL (ref 79–97)
Platelets: 242 10*3/uL (ref 150–450)
RBC: 4.96 x10E6/uL (ref 3.77–5.28)
RDW: 12.7 % (ref 11.7–15.4)
WBC: 8.4 10*3/uL (ref 3.4–10.8)

## 2018-07-19 LAB — BASIC METABOLIC PANEL
BUN/Creatinine Ratio: 11 (ref 9–23)
BUN: 9 mg/dL (ref 6–20)
CO2: 19 mmol/L — AB (ref 20–29)
Calcium: 9.4 mg/dL (ref 8.7–10.2)
Chloride: 103 mmol/L (ref 96–106)
Creatinine, Ser: 0.82 mg/dL (ref 0.57–1.00)
GFR calc Af Amer: 117 mL/min/{1.73_m2} (ref >59)
GFR, EST NON AFRICAN AMERICAN: 102 mL/min/{1.73_m2} (ref >59)
GLUCOSE: 102 mg/dL — AB (ref 65–99)
POTASSIUM: 4.3 mmol/L (ref 3.5–5.2)
SODIUM: 139 mmol/L (ref 134–144)

## 2018-07-21 ENCOUNTER — Other Ambulatory Visit: Payer: Self-pay

## 2018-07-21 ENCOUNTER — Encounter: Payer: Self-pay | Admitting: Family Medicine

## 2018-07-21 ENCOUNTER — Ambulatory Visit (INDEPENDENT_AMBULATORY_CARE_PROVIDER_SITE_OTHER): Payer: BLUE CROSS/BLUE SHIELD | Admitting: Family Medicine

## 2018-07-21 VITALS — BP 104/62 | HR 65 | Temp 98.3°F | Wt 163.4 lb

## 2018-07-21 DIAGNOSIS — I82431 Acute embolism and thrombosis of right popliteal vein: Secondary | ICD-10-CM

## 2018-07-21 NOTE — Progress Notes (Signed)
  Patient Name: Olivia Hayes Date of Birth: 01-11-1996 Date of Visit: 07/21/18 PCP: Myrene Buddy, MD  Chief Complaint: right leg pain   Subjective: Olivia Hayes is a pleasant 23 y.o. year old with recent diagnosis of right popliteal DVT in setting of OCP use presenting today for follow up. She is joined by her father.   The patient reports overall significant improvement in her right leg pain.  She denies chest pain, palpitations, difficulty breathing.  She reports she is walking better without crutches.  She is only able to bear weight on her forefoot due to some pain in her calf.  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  Her job is pretty labor-intensive she walks a significant amount and lifts.  He does requires a squat and walk distances as well.  ROS:  ROS Negative as above I have reviewed the patient's medical, surgical, family, and social history as appropriate.   Vitals:   07/21/18 0840  BP: 104/62  Pulse: 65  Temp: 98.3 F (36.8 C)  SpO2: 98%   Filed Weights   07/21/18 0840  Weight: 163 lb 6.4 oz (74.1 kg)   HEENT: Sclera anicteric. Dentition is moderate. Appears well hydrated. Neck: Supple Cardiac: Regular rate and rhythm. Normal S1/S2. No murmurs, rubs, or gallops appreciated. Lungs: Clear bilaterally to ascultation.   Extremities: Warm, well perfused   1+ right lower extremity pitting edema trace in the left.  The right leg is still slightly swollen as compared to the left there is calf tenderness.  She is able to ambulate only on her forefoot and has significant pain with bearing weight and using her posterior chain hamstrings and gastrocs   Olivia Hayes was seen today for follow-up.  Diagnoses and all orders for this visit:  Acute deep vein thrombosis (DVT) of popliteal vein of right lower extremity (HCC) discussed diagnosis at length.  The patient's pain is slightly improved and she is able to ambulate with improvement.  She has  no signs or symptoms suggestive of pulmonary embolism.  She is tolerating being her Xarelto very well.  We discussed the importance of taking this medication twice a day for the first 21 days of therapy followed by once a day with a fatty meal.  She does eat fat.  She will take this with dinner.  She is taking this with her 11 AM snack which is high in fat and her dinner.  We discussed contraception management.  Recommended further discussion with obstetrician and gynecology.  The patient does desire to have children in the future and is concerned about her risk of recurrence of thrombotic event in pregnancy.  May also consider referral to hematology in the future.  This may help guide management in her future pregnancies in terms of the indications for prophylactic or therapeutic Lovenox during those pregnancies.  Terisa Starr, MD  Family Medicine Teaching Service

## 2018-07-21 NOTE — Patient Instructions (Addendum)
It was wonderful to see you today. I recommend calling [facility] to discuss contraception   [phone]    Please call [phone] with any questions about today's appointment.  I will call your insurer to see if we can appeal Monday's visit.  I will let you know the results of this call with your insurer.  If you receive a large bill in the mail for the visit earlier this week or the visit for today please let me know and I can try to re-appeal this.  Please be sure to schedule follow up at the front  desk before you leave today.   Terisa Starr, MD  Family Medicine

## 2018-07-25 ENCOUNTER — Encounter: Payer: Self-pay | Admitting: Family Medicine

## 2018-07-25 NOTE — Progress Notes (Signed)
BCBS form placed in to fax area. Attempted to call insurer regarding appointment on Monday and coverage for patient---asked to fax form. Form faxed.   Terisa Starr, MD  Family Medicine Teaching Service

## 2018-07-26 NOTE — Addendum Note (Signed)
Addended by: Manson Passey, CARINA on: 07/26/2018 09:42 AM   Modules accepted: Level of Service

## 2018-10-15 DIAGNOSIS — B009 Herpesviral infection, unspecified: Secondary | ICD-10-CM | POA: Insufficient documentation

## 2019-07-06 NOTE — L&D Delivery Note (Signed)
OB/GYN Faculty Practice Delivery Note  Olivia Hayes is a 24 y.o. G2P0010 s/p vaginal delivery at [redacted]w[redacted]d. She was admitted for IOL secondary to LGA infant and history of DVT.   ROM: 4h 76m with clear fluid GBS Status: positive on PCN Maximum Maternal Temperature: 98.26F  Labor Progress: Pt received FB and cytotec x2 on admission. She then continued to progress without additional augmentation. She was noted to have SROM for clear fluid and then had complete cervical dilation at 1450. She then had an uncomplicated delivery as noted below.  Delivery Date/Time: 1526 on 04/20/20 Delivery: Called to room and patient was complete and pushing. Head delivered LOA. No nuchal cord present. Shoulder and body delivered in usual fashion. Infant with spontaneous cry, placed on mother's abdomen, dried and stimulated. Cord clamped x 2 after 1-minute delay, and cut by FOB under my direct supervision. Cord blood drawn. Placenta delivered spontaneously with gentle cord traction. Fundus firm with massage and Pitocin. Labia, perineum, vagina, and cervix were inspected, notable for second degree perineal laceration and bilateral periurethral lacerations (left side hemostatic).   Placenta: intact, 3-vessel cord, sent to L&D Complications: none Lacerations:  second degree perineal laceration and bilateral periurethral lacerations (left side hemostatic); repaired perineal and right periurethral in standard fashion with 3-0 & 4-0 vicryl respectively EBL: 250 ml Analgesia: epidural, lidocaine for repairs  Infant: girl  APGARs 8 & 9  weight pending  Lynnda Shields, MD OB/GYN Fellow, Faculty Practice

## 2019-08-24 ENCOUNTER — Inpatient Hospital Stay (HOSPITAL_COMMUNITY)
Admission: AD | Admit: 2019-08-24 | Discharge: 2019-08-24 | Disposition: A | Discharge status: Home / Self Care | Payer: 59 | Attending: Obstetrics and Gynecology | Admitting: Obstetrics and Gynecology

## 2019-08-24 ENCOUNTER — Ambulatory Visit (INDEPENDENT_AMBULATORY_CARE_PROVIDER_SITE_OTHER): Payer: 59

## 2019-08-24 ENCOUNTER — Inpatient Hospital Stay (HOSPITAL_COMMUNITY): Payer: 59

## 2019-08-24 ENCOUNTER — Other Ambulatory Visit: Payer: Self-pay

## 2019-08-24 ENCOUNTER — Encounter: Payer: Self-pay | Admitting: Family Medicine

## 2019-08-24 ENCOUNTER — Encounter (HOSPITAL_COMMUNITY): Payer: Self-pay | Admitting: Obstetrics and Gynecology

## 2019-08-24 DIAGNOSIS — Z3200 Encounter for pregnancy test, result unknown: Secondary | ICD-10-CM

## 2019-08-24 DIAGNOSIS — O3680X0 Pregnancy with inconclusive fetal viability, not applicable or unspecified: Secondary | ICD-10-CM | POA: Insufficient documentation

## 2019-08-24 DIAGNOSIS — Z3201 Encounter for pregnancy test, result positive: Secondary | ICD-10-CM

## 2019-08-24 DIAGNOSIS — Z86718 Personal history of other venous thrombosis and embolism: Secondary | ICD-10-CM | POA: Insufficient documentation

## 2019-08-24 DIAGNOSIS — Z3A01 Less than 8 weeks gestation of pregnancy: Secondary | ICD-10-CM | POA: Diagnosis not present

## 2019-08-24 DIAGNOSIS — J45909 Unspecified asthma, uncomplicated: Secondary | ICD-10-CM | POA: Insufficient documentation

## 2019-08-24 DIAGNOSIS — O26891 Other specified pregnancy related conditions, first trimester: Secondary | ICD-10-CM | POA: Insufficient documentation

## 2019-08-24 DIAGNOSIS — O99511 Diseases of the respiratory system complicating pregnancy, first trimester: Secondary | ICD-10-CM | POA: Diagnosis not present

## 2019-08-24 DIAGNOSIS — Z6790 Unspecified blood type, Rh positive: Secondary | ICD-10-CM

## 2019-08-24 DIAGNOSIS — Z7901 Long term (current) use of anticoagulants: Secondary | ICD-10-CM | POA: Diagnosis not present

## 2019-08-24 DIAGNOSIS — R109 Unspecified abdominal pain: Secondary | ICD-10-CM | POA: Diagnosis not present

## 2019-08-24 LAB — CBC
HCT: 43.9 % (ref 36.0–46.0)
Hemoglobin: 14.6 g/dL (ref 12.0–15.0)
MCH: 29.3 pg (ref 26.0–34.0)
MCHC: 33.3 g/dL (ref 30.0–36.0)
MCV: 88 fL (ref 80.0–100.0)
Platelets: 244 10*3/uL (ref 150–400)
RBC: 4.99 MIL/uL (ref 3.87–5.11)
RDW: 13.9 % (ref 11.5–15.5)
WBC: 8.3 10*3/uL (ref 4.0–10.5)
nRBC: 0 % (ref 0.0–0.2)

## 2019-08-24 LAB — URINALYSIS, ROUTINE W REFLEX MICROSCOPIC
Bilirubin Urine: NEGATIVE
Glucose, UA: NEGATIVE mg/dL
Hgb urine dipstick: NEGATIVE
Ketones, ur: NEGATIVE mg/dL
Leukocytes,Ua: NEGATIVE
Nitrite: NEGATIVE
Protein, ur: NEGATIVE mg/dL
Specific Gravity, Urine: 1.027 (ref 1.005–1.030)
pH: 5 (ref 5.0–8.0)

## 2019-08-24 LAB — WET PREP, GENITAL
Clue Cells Wet Prep HPF POC: NONE SEEN
Sperm: NONE SEEN
Trich, Wet Prep: NONE SEEN
Yeast Wet Prep HPF POC: NONE SEEN

## 2019-08-24 LAB — POCT PREGNANCY, URINE: Preg Test, Ur: POSITIVE — AB

## 2019-08-24 LAB — HIV ANTIBODY (ROUTINE TESTING W REFLEX): HIV Screen 4th Generation wRfx: NONREACTIVE

## 2019-08-24 LAB — ABO/RH: ABO/RH(D): A POS

## 2019-08-24 LAB — HCG, QUANTITATIVE, PREGNANCY: hCG, Beta Chain, Quant, S: 14181 m[IU]/mL — ABNORMAL HIGH (ref <5)

## 2019-08-24 MED ORDER — PRENATAL PLUS 27-1 MG PO TABS: 1.0000 | ORAL_TABLET | Freq: Every day | ORAL | 11 refills | Status: DC

## 2019-08-24 MED ORDER — ENOXAPARIN SODIUM 80 MG/0.8ML ~~LOC~~ SOLN: 80.0000 mg | Freq: Two times a day (BID) | SUBCUTANEOUS | 1 refills | Status: DC

## 2019-08-24 NOTE — Progress Notes (Signed)
Pt here today for UPT; result is positive. Pt states first positive home UPT was 08/15/19. LMP is 07/20/19; pt is 5w today with EDD of 04/25/20. Medications and allergies reviewed with pt; list of medications safe to take during pregnancy given. Rx sent to pharmacy for prenatal vitamins.   Pt reports hx of DVT in RLL that has resolved; states she has tingling below right knee today. Pt's history and UPT result reviewed with Debroah Loop, MD who gives verbal order for Lovenox 80mg  BID.   Pt states she is experiencing sharp pelvic cramping that she rates 6/10. Explained to pt she will need to go to the MAU for further evaluation as this could be a sign of ectopic pregnancy. Report called to MAU.   RN 08/24/19

## 2019-08-24 NOTE — MAU Note (Signed)
Presents with c/o RLQ pain, reports pain is sharp.  Denies VB.  States had pregnancy confirmation today @ [facility] office.

## 2019-08-24 NOTE — MAU Provider Note (Signed)
Chief Complaint: Abdominal Pain   First Provider Initiated Contact with Patient 08/24/19 1345      SUBJECTIVE HPI: Olivia Hayes is a 24 y.o. G2P0010 at [redacted]w[redacted]d by LMP with medical hx significant for DVT in her RLL in 2020 with pregnancy confirmed at [facility] today who presents to maternity admissions reporting right sided abdominal pain. The pain is mild, intermittent cramping pain. There is associated pressure in her pelvis. There are no other symptoms. She has not tried any treatments.     HPI  Past Medical History:  Diagnosis Date  . Asthma    exercise induced  . DVT (deep venous thrombosis) (HCC)    provoked by [facility] OCPs   . Pelvic floor dysfunction    Past Surgical History:  Procedure Laterality Date  . ABDOMINAL HERNIA REPAIR  age 41   Social History   Socioeconomic History  . Marital status: Single    Spouse name: Not on file  . Number of children: Not on file  . Years of education: Not on file  . Highest education level: Not on file  Occupational History  . Not on file  Tobacco Use  . Smoking status: Never Smoker  . Smokeless tobacco: Never Used  Substance and Sexual Activity  . Alcohol use: Yes    Comment: Social  . Drug use: Yes    Types: Marijuana    Comment: last smoked beginning February 2021  . Sexual activity: Yes    Birth control/protection: None    Comment: intercourse age 27, less than 5 sexua partners  Other Topics Concern  . Not on file  Social History Narrative  . Not on file   Social Determinants of Health   Financial Resource Strain:   . Difficulty of Paying Living Expenses: Not on file  Food Insecurity:   . Worried About Programme researcher, broadcasting/film/video in the Last Year: Not on file  . Ran Out of Food in the Last Year: Not on file  Transportation Needs:   . Lack of Transportation (Medical): Not on file  . Lack of Transportation (Non-Medical): Not on file  Physical Activity:   . Days of Exercise per Week: Not on file  . Minutes of Exercise per  Session: Not on file  Stress:   . Feeling of Stress : Not on file  Social Connections:   . Frequency of Communication with Friends and Family: Not on file  . Frequency of Social Gatherings with Friends and Family: Not on file  . Attends Religious Services: Not on file  . Active Member of Clubs or Organizations: Not on file  . Attends Banker Meetings: Not on file  . Marital Status: Not on file  Intimate Partner Violence:   . Fear of Current or Ex-Partner: Not on file  . Emotionally Abused: Not on file  . Physically Abused: Not on file  . Sexually Abused: Not on file   No current facility-administered medications on file prior to encounter.   Current Outpatient Medications on File Prior to Encounter  Medication Sig Dispense Refill  . enoxaparin (LOVENOX) 80 MG/0.8ML injection Inject 0.8 mLs (80 mg total) into the skin every 12 (twelve) hours. 48 mL 1  . metroNIDAZOLE (FLAGYL) 500 MG tablet Take 500 mg by mouth as directed.    . prenatal vitamin w/FE, FA (PRENATAL 1 + 1) 27-1 MG TABS tablet Take 1 tablet by mouth daily at 12 noon. 30 tablet 11  . rivaroxaban (XARELTO) 20 MG TABS tablet Take  1 tablet (20 mg total) by mouth daily with supper. START AFTER YOU COMPLETE STARTER PACK 30 tablet 1  . Rivaroxaban 15 & 20 MG TBPK Take as directed on package: Start with one 15mg  tablet by mouth twice a day with food. On Day 22, switch to one 20mg  tablet once a day with food. 51 each 0   Allergies  Allergen Reactions  . Peanuts [Peanut Oil] Anaphylaxis and Hives    ROS:  Review of Systems  Constitutional: Negative for chills, fatigue and fever.  Respiratory: Negative for shortness of breath.   Cardiovascular: Negative for chest pain.  Gastrointestinal: Positive for abdominal pain. Negative for nausea and vomiting.  Genitourinary: Positive for pelvic pain. Negative for difficulty urinating, dysuria, flank pain, vaginal bleeding, vaginal discharge and vaginal pain.   Musculoskeletal: Negative for back pain.  Neurological: Negative for dizziness and headaches.  Psychiatric/Behavioral: Negative.      I have reviewed patient's Past Medical Hx, Surgical Hx, Family Hx, Social Hx, medications and allergies.   Physical Exam   Patient Vitals for the past 24 hrs:  BP Temp Temp src Pulse Resp SpO2 Height Weight  08/24/19 1606 120/65 97.6 F (36.4 C) Oral 71 19 99 % -- --  08/24/19 1218 112/61 97.6 F (36.4 C) Oral 71 18 100 % -- --  08/24/19 1206 -- -- -- -- -- -- 5\' 4"  (1.626 m) 74.5 kg   Constitutional: Well-developed, well-nourished female in no acute distress.  Cardiovascular: normal rate Respiratory: normal effort GI: Abd soft, non-tender. Pos BS x 4 MS: Extremities nontender, no edema, normal ROM Neurologic: Alert and oriented x 4.  GU: Neg CVAT.  PELVIC EXAM: Wet prep/GCC collected by blind swab  LAB RESULTS Results for orders placed or performed during the hospital encounter of 08/24/19 (from the past 24 hour(s))  Urinalysis, Routine w reflex microscopic     Status: Abnormal   Collection Time: 08/24/19 12:23 PM  Result Value Ref Range   Color, Urine YELLOW YELLOW   APPearance HAZY (A) CLEAR   Specific Gravity, Urine 1.027 1.005 - 1.030   pH 5.0 5.0 - 8.0   Glucose, UA NEGATIVE NEGATIVE mg/dL   Hgb urine dipstick NEGATIVE NEGATIVE   Bilirubin Urine NEGATIVE NEGATIVE   Ketones, ur NEGATIVE NEGATIVE mg/dL   Protein, ur NEGATIVE NEGATIVE mg/dL   Nitrite NEGATIVE NEGATIVE   Leukocytes,Ua NEGATIVE NEGATIVE  hCG, quantitative, pregnancy     Status: Abnormal   Collection Time: 08/24/19  1:29 PM  Result Value Ref Range   hCG, Beta Chain, Quant, S 14,181 (H) <5 mIU/mL  ABO/Rh     Status: None   Collection Time: 08/24/19  1:29 PM  Result Value Ref Range   ABO/RH(D) A POS    No rh immune globuloin      NOT A RH IMMUNE GLOBULIN CANDIDATE, PT RH POSITIVE Performed at [facility], [address] [place], [facility] [address]   HIV  Antibody (routine testing w rflx)     Status: None   Collection Time: 08/24/19  1:31 PM  Result Value Ref Range   HIV Screen 4th Generation wRfx NON REACTIVE NON REACTIVE  Wet prep, genital     Status: Abnormal   Collection Time: 08/24/19  1:58 PM   Specimen: Vaginal  Result Value Ref Range   Yeast Wet Prep HPF POC NONE SEEN NONE SEEN   Trich, Wet Prep NONE SEEN NONE SEEN   Clue Cells Wet Prep HPF POC NONE SEEN NONE SEEN  WBC, Wet Prep HPF POC FEW (A) NONE SEEN   Sperm NONE SEEN   CBC     Status: None   Collection Time: 08/24/19  2:59 PM  Result Value Ref Range   WBC 8.3 4.0 - 10.5 K/uL   RBC 4.99 3.87 - 5.11 MIL/uL   Hemoglobin 14.6 12.0 - 15.0 g/dL   HCT 27.0 62.3 - 76.2 %   MCV 88.0 80.0 - 100.0 fL   MCH 29.3 26.0 - 34.0 pg   MCHC 33.3 30.0 - 36.0 g/dL   RDW 83.1 51.7 - 61.6 %   Platelets 244 150 - 400 K/uL   nRBC 0.0 0.0 - 0.2 %    --/--/A POS (02/19 1329)  IMAGING US OB LESS THAN 14 WEEKS WITH OB TRANSVAGINAL  Result Date: 08/24/2019 CLINICAL DATA:  Abdominal pain. Quantitative beta HCG level is 14,181 EXAM: OBSTETRIC <14 WK [place] AND TRANSVAGINAL OB US TECHNIQUE: Both transabdominal and transvaginal ultrasound examinations were performed for complete evaluation of the gestation as well as the maternal uterus, adnexal regions, and pelvic cul-de-sac. Transvaginal technique was performed to assess early pregnancy. COMPARISON:  None. FINDINGS: Intrauterine gestational sac: Single Yolk sac:  Not Visualized. Embryo:  Not Visualized. Cardiac Activity: Not applicable MSD: 7.56 mm   5 w   3 d Subchorionic hemorrhage:  None visualized. Maternal uterus/adnexae: No uterine masses. Cervix is closed. Ovaries normal in size and appearance. Small amount of cul-de-sac pelvic free fluid that is likely physiologic. IMPRESSION: 1. Probable early intrauterine gestational sac, but no yolk sac, fetal pole, or cardiac activity yet visualized. Recommend follow-up quantitative B-HCG levels and follow-up  US in 14 days to assess viability. This recommendation follows SRU consensus guidelines: Diagnostic Criteria for Nonviable Pregnancy Early in the First Trimester. Malva Limes Med 2013; 073:7106-26. 2. Gestational sac size consistent with a 5 week 3 day pregnancy. 3. No emergent abnormalities.  Ovaries and adnexa are unremarkable. Electronically Signed   By: Amie Portland M.D.   On: 08/24/2019 15:18    MAU Management/MDM: Orders Placed This Encounter  Procedures  . Wet prep, genital  . US OB LESS THAN 14 WEEKS WITH OB TRANSVAGINAL  . Urinalysis, Routine w reflex microscopic  . hCG, quantitative, pregnancy  . HIV Antibody (routine testing w rflx)  . CBC  . ABO/Rh  . Discharge patient    No orders of the defined types were placed in this encounter.   Findings today could represent a normal early pregnancy, spontaneous abortion or ectopic pregnancy which can be life-threatening.   Given high hcg, consulted Dr Vergie Living.  Pt to return in 24 hours for quant hcg and ultrasound.    Ok for patient husband to come back to triage/pt room when results of labs/ultrasound are presented.    Pt discharged with strict ectopic precautions.  ASSESSMENT 1. Pregnancy of unknown anatomic location   2. Abdominal pain in pregnancy, first trimester   3. History of DVT (deep vein thrombosis)   4. Blood type, Rh positive     PLAN Discharge home  Allergies as of 08/24/2019      Reactions   Peanuts [peanut Oil] Anaphylaxis, Hives      Medication List    STOP taking these medications   metroNIDAZOLE 500 MG tablet Commonly known as: FLAGYL   Rivaroxaban 15 & 20 MG Tbpk   rivaroxaban 20 MG Tabs tablet Commonly known as: XARELTO     TAKE these medications   enoxaparin 80 MG/0.8ML injection Commonly known as:  LOVENOX Inject 0.8 mLs (80 mg total) into the skin every 12 (twelve) hours.   prenatal vitamin w/FE, FA 27-1 MG Tabs tablet Take 1 tablet by mouth daily at 12 noon.      Follow-up  Information    [facility] Follow up.   Specialty: Obstetrics and Gynecology Why: Return tomorrow, Saturday, August 25, 2019, after 2 pm for repeat labs. Return sooner as needed for emergencies. Contact information: [address] [id number] [place]          Fatima Blank Certified Nurse-Midwife 08/24/2019  5:13 PM

## 2019-08-24 NOTE — Progress Notes (Signed)
GC/Chlamydia & wet prep cultures obtained by L. Leftwich-Kirby, CNM.

## 2019-08-24 NOTE — Progress Notes (Signed)
Patient ID: Olivia Hayes, female   DOB: March 06, 1996, 24 y.o.   MRN: [id number] Patient seen and assessed by nursing staff during this encounter. I have reviewed the chart and agree with the documentation and plan.  Scheryl Darter, MD 08/24/2019 12:28 PM

## 2019-08-25 ENCOUNTER — Other Ambulatory Visit: Payer: Self-pay

## 2019-08-25 ENCOUNTER — Inpatient Hospital Stay (HOSPITAL_COMMUNITY)
Admission: AD | Admit: 2019-08-25 | Discharge: 2019-08-25 | Disposition: A | Discharge status: Home / Self Care | Payer: 59 | Source: Ambulatory Visit | Attending: Family Medicine | Admitting: Family Medicine

## 2019-08-25 ENCOUNTER — Inpatient Hospital Stay (HOSPITAL_COMMUNITY): Payer: 59

## 2019-08-25 DIAGNOSIS — Z86718 Personal history of other venous thrombosis and embolism: Secondary | ICD-10-CM | POA: Diagnosis not present

## 2019-08-25 DIAGNOSIS — R102 Pelvic and perineal pain: Secondary | ICD-10-CM | POA: Insufficient documentation

## 2019-08-25 DIAGNOSIS — O26891 Other specified pregnancy related conditions, first trimester: Secondary | ICD-10-CM | POA: Diagnosis present

## 2019-08-25 DIAGNOSIS — Z3A01 Less than 8 weeks gestation of pregnancy: Secondary | ICD-10-CM | POA: Insufficient documentation

## 2019-08-25 DIAGNOSIS — Z3201 Encounter for pregnancy test, result positive: Secondary | ICD-10-CM

## 2019-08-25 DIAGNOSIS — Z3200 Encounter for pregnancy test, result unknown: Secondary | ICD-10-CM

## 2019-08-25 DIAGNOSIS — Z3491 Encounter for supervision of normal pregnancy, unspecified, first trimester: Secondary | ICD-10-CM

## 2019-08-25 LAB — HCG, QUANTITATIVE, PREGNANCY: hCG, Beta Chain, Quant, S: 20993 m[IU]/mL — ABNORMAL HIGH (ref <5)

## 2019-08-25 MED ORDER — ENOXAPARIN SODIUM 80 MG/0.8ML ~~LOC~~ SOLN: 80.0000 mg | Freq: Two times a day (BID) | SUBCUTANEOUS | 1 refills | Status: DC

## 2019-08-25 NOTE — Discharge Instructions (Signed)

## 2019-08-25 NOTE — MAU Note (Signed)
Pt reports to mau for follow-up quant and [place].  Pt denies any new complaints today.

## 2019-08-25 NOTE — MAU Provider Note (Signed)
History   Chief Complaint:  Labs Only   Olivia Hayes is  24 y.o. G2P0010 Patient's last menstrual period was 07/20/2019 (exact date).. Patient is here for follow up of quantitative HCG and ongoing surveillance of pregnancy status. She is [redacted]w[redacted]d weeks gestation  by LMP.    Since her last visit, the patient is without new complaint. The patient reports bleeding as  none now.  She denies any pain.  General ROS:  negative  Her previous Quantitative HCG values are:  Results for DJENEBA, BARSCH (MRN [id number]) as of 08/25/2019 18:31  Ref. Range 08/24/2019 13:29  HCG, Beta Chain, Quant, S Latest Ref Range: <5 mIU/mL 14,181 (H)   Physical Exam   Blood pressure (!) 101/59, pulse (!) 101, temperature 98.4 F (36.9 C), temperature source Oral, resp. rate 16, last menstrual period 07/20/2019, SpO2 100 %.  Focused Gynecological Exam: examination not indicated  Labs: Results for orders placed or performed during the hospital encounter of 08/25/19 (from the past 24 hour(s))  hCG, quantitative, pregnancy   Collection Time: 08/25/19  4:26 PM  Result Value Ref Range   hCG, Beta Chain, Quant, S 20,993 (H) <5 mIU/mL    Ultrasound Studies:   US OB Transvaginal  Result Date: 08/25/2019 CLINICAL DATA:  Inconclusive fetal viability. Beta hCG was 14,181 on 08/24/2019. Last menstrual period was 07/20/2019. EXAM: TRANSVAGINAL OB ULTRASOUND TECHNIQUE: Transvaginal ultrasound was performed for complete evaluation of the gestation as well as the maternal uterus, adnexal regions, and pelvic cul-de-sac. COMPARISON:  Pelvic ultrasound dated 08/24/2019. FINDINGS: Intrauterine gestational sac: Single Yolk sac:  Visualized. Embryo:  Not Visualized. Cardiac Activity: Not applicable MSD: 9.1 mm   5 w   5 d Subchorionic hemorrhage:  None visualized. Maternal uterus/adnexae: No abnormality identified. No free fluid identified. IMPRESSION: Single gestational sac with a yolk sac visualized. Electronically Signed   By: Romona Curls M.D.   On: 08/25/2019 18:15   US OB LESS THAN 14 WEEKS WITH OB TRANSVAGINAL  Result Date: 08/24/2019 CLINICAL DATA:  Abdominal pain. Quantitative beta HCG level is 14,181 EXAM: OBSTETRIC <14 WK [place] AND TRANSVAGINAL OB US TECHNIQUE: Both transabdominal and transvaginal ultrasound examinations were performed for complete evaluation of the gestation as well as the maternal uterus, adnexal regions, and pelvic cul-de-sac. Transvaginal technique was performed to assess early pregnancy. COMPARISON:  None. FINDINGS: Intrauterine gestational sac: Single Yolk sac:  Not Visualized. Embryo:  Not Visualized. Cardiac Activity: Not applicable MSD: 7.56 mm   5 w   3 d Subchorionic hemorrhage:  None visualized. Maternal uterus/adnexae: No uterine masses. Cervix is closed. Ovaries normal in size and appearance. Small amount of cul-de-sac pelvic free fluid that is likely physiologic. IMPRESSION: 1. Probable early intrauterine gestational sac, but no yolk sac, fetal pole, or cardiac activity yet visualized. Recommend follow-up quantitative B-HCG levels and follow-up US in 14 days to assess viability. This recommendation follows SRU consensus guidelines: Diagnostic Criteria for Nonviable Pregnancy Early in the First Trimester. Malva Limes Med 2013; 427:0623-76. 2. Gestational sac size consistent with a 5 week 3 day pregnancy. 3. No emergent abnormalities.  Ovaries and adnexa are unremarkable. Electronically Signed   By: Amie Portland M.D.   On: 08/24/2019 15:18   Gestational sac with yolk sac seen on u/s today. Results reviewed at length with patient and FOB  Assessment:   1. Normal intrauterine pregnancy on prenatal ultrasound in first trimester   2. [redacted] weeks gestation of pregnancy   3. Possible pregnancy   4. History  of DVT (deep vein thrombosis)     Plan: -Discharge home in stable condition -First trimester precautions discussed -Patient advised to follow-up with [facility] u/s in 2 weeks. Order placed.  -Patient may  return to MAU as needed or if her condition were to change or worsen  Wende Mott, CNM 08/25/2019, 6:32 PM

## 2019-08-27 LAB — GC/CHLAMYDIA PROBE AMP (~~LOC~~) NOT AT ARMC
Chlamydia: NEGATIVE
Comment: NEGATIVE
Comment: NORMAL
Neisseria Gonorrhea: NEGATIVE

## 2019-09-11 ENCOUNTER — Ambulatory Visit (INDEPENDENT_AMBULATORY_CARE_PROVIDER_SITE_OTHER): Payer: 59 | Admitting: General Practice

## 2019-09-11 ENCOUNTER — Ambulatory Visit (HOSPITAL_COMMUNITY)
Admission: RE | Admit: 2019-09-11 | Discharge: 2019-09-11 | Disposition: A | Discharge status: Home / Self Care | Payer: 59 | Source: Ambulatory Visit

## 2019-09-11 ENCOUNTER — Encounter: Payer: Self-pay | Admitting: General Practice

## 2019-09-11 ENCOUNTER — Other Ambulatory Visit: Payer: Self-pay

## 2019-09-11 DIAGNOSIS — Z3491 Encounter for supervision of normal pregnancy, unspecified, first trimester: Secondary | ICD-10-CM | POA: Insufficient documentation

## 2019-09-11 DIAGNOSIS — Z712 Person consulting for explanation of examination or test findings: Secondary | ICD-10-CM

## 2019-09-11 NOTE — Progress Notes (Signed)
Patient seen and assessed by nursing staff during this encounter. I have reviewed the chart and agree with the documentation and plan.  Vernie Vinciguerra, MD 09/11/2019 2:37 PM    

## 2019-09-11 NOTE — Progress Notes (Signed)
Patient presents to office today for viability ultrasound results. Reviewed results with Dr Macon Large who finds single living IUP- patient should begin OB care and PNV.   Informed patient of results, reviewed dating, & provided pictures. Patient reports taking PNV. She also notes difficulty in picking up Lovenox Rx as it is $350 with insurance. She has been using syringes she had from last year currently. Will call insurance for more information. Patient states her insurance plan may also be changing and will check in with them.  Called [facility] and was informed that is the cost of the medication and the medication is on formulary. The medication is a Tier 4 drug and no alternatives are listed. They state best option is to file a claim to try to lower the Tier and maybe that will reduce the cost. Reference #[id number] provided. Clinical Forms will be faxed to [place].   Chase Caller RN BSN 09/11/19

## 2019-09-26 ENCOUNTER — Encounter (HOSPITAL_COMMUNITY): Payer: Self-pay | Admitting: Obstetrics & Gynecology

## 2019-09-26 ENCOUNTER — Inpatient Hospital Stay (HOSPITAL_COMMUNITY)
Admission: AD | Admit: 2019-09-26 | Discharge: 2019-09-27 | Disposition: A | Discharge status: Home / Self Care | Payer: 59 | Attending: Obstetrics & Gynecology | Admitting: Obstetrics & Gynecology

## 2019-09-26 ENCOUNTER — Other Ambulatory Visit: Payer: Self-pay

## 2019-09-26 DIAGNOSIS — Z86718 Personal history of other venous thrombosis and embolism: Secondary | ICD-10-CM | POA: Insufficient documentation

## 2019-09-26 DIAGNOSIS — R300 Dysuria: Secondary | ICD-10-CM | POA: Diagnosis not present

## 2019-09-26 DIAGNOSIS — R102 Pelvic and perineal pain: Secondary | ICD-10-CM | POA: Diagnosis not present

## 2019-09-26 DIAGNOSIS — O99891 Other specified diseases and conditions complicating pregnancy: Secondary | ICD-10-CM | POA: Insufficient documentation

## 2019-09-26 DIAGNOSIS — Z3A09 9 weeks gestation of pregnancy: Secondary | ICD-10-CM | POA: Diagnosis not present

## 2019-09-26 DIAGNOSIS — R1030 Lower abdominal pain, unspecified: Secondary | ICD-10-CM | POA: Insufficient documentation

## 2019-09-26 DIAGNOSIS — R109 Unspecified abdominal pain: Secondary | ICD-10-CM | POA: Diagnosis present

## 2019-09-26 DIAGNOSIS — R350 Frequency of micturition: Secondary | ICD-10-CM | POA: Diagnosis not present

## 2019-09-26 DIAGNOSIS — O26891 Other specified pregnancy related conditions, first trimester: Secondary | ICD-10-CM | POA: Diagnosis not present

## 2019-09-26 NOTE — MAU Note (Signed)
I woke up this am and was having sharp pains in R side of uterus. Was hoping it would go away but continues. No VB.  Pain comes and goes. Also having some uterine cramping.

## 2019-09-26 NOTE — MAU Provider Note (Signed)
Chief Complaint: Abdominal Pain   First Provider Initiated Contact with Patient 09/26/19 2308        SUBJECTIVE HPI: Olivia Hayes is a 25 y.o. G2P0010 at [redacted]w[redacted]d by LMP who presents to maternity admissions reporting sharp pains off and on on the right side of her uterus.  . She denies vaginal bleeding, vaginal itching/burning, urinary symptoms, h/a, dizziness, n/v, or fever/chills.    Abdominal Pain This is a recurrent problem. The onset quality is gradual. The problem occurs intermittently. The problem has been unchanged. The pain is located in the RLQ and suprapubic region. The pain is moderate. The quality of the pain is cramping and sharp. The abdominal pain does not radiate. Associated symptoms include dysuria and frequency. Pertinent negatives include no constipation, diarrhea, fever, myalgias, nausea or vomiting. Nothing aggravates the pain. The pain is relieved by nothing. She has tried nothing for the symptoms.   RN note:  woke up this am and was having sharp pains in R side of uterus. Was hoping it would go away but continues. No VB.  Pain comes and goes. Also having some uterine cramping.   Past Medical History:  Diagnosis Date  . Asthma    exercise induced  . DVT (deep venous thrombosis) (HCC)    provoked by [facility]   . Pelvic floor dysfunction    Past Surgical History:  Procedure Laterality Date  . ABDOMINAL HERNIA REPAIR  age 89  . INDUCED ABORTION     Social History   Socioeconomic History  . Marital status: Married    Spouse name: Not on file  . Number of children: Not on file  . Years of education: Not on file  . Highest education level: Not on file  Occupational History  . Not on file  Tobacco Use  . Smoking status: Never Smoker  . Smokeless tobacco: Never Used  Substance and Sexual Activity  . Alcohol use: Yes    Comment: Social  . Drug use: Yes    Types: Marijuana    Comment: last smoked beginning February 2021  . Sexual activity: Yes    Birth  control/protection: None    Comment: intercourse age 52, less than 5 sexua partners  Other Topics Concern  . Not on file  Social History Narrative  . Not on file   Social Determinants of Health   Financial Resource Strain:   . Difficulty of Paying Living Expenses:   Food Insecurity:   . Worried About Programme researcher, broadcasting/film/video in the Last Year:   . Barista in the Last Year:   Transportation Needs:   . Freight forwarder (Medical):   Marland Kitchen Lack of Transportation (Non-Medical):   Physical Activity:   . Days of Exercise per Week:   . Minutes of Exercise per Session:   Stress:   . Feeling of Stress :   Social Connections:   . Frequency of Communication with Friends and Family:   . Frequency of Social Gatherings with Friends and Family:   . Attends Religious Services:   . Active Member of Clubs or Organizations:   . Attends Banker Meetings:   Marland Kitchen Marital Status:   Intimate Partner Violence:   . Fear of Current or Ex-Partner:   . Emotionally Abused:   Marland Kitchen Physically Abused:   . Sexually Abused:    No current facility-administered medications on file prior to encounter.   Current Outpatient Medications on File Prior to Encounter  Medication Sig Dispense Refill  .  enoxaparin (LOVENOX) 80 MG/0.8ML injection Inject 0.8 mLs (80 mg total) into the skin every 12 (twelve) hours. 48 mL 1  . prenatal vitamin w/FE, FA (PRENATAL 1 + 1) 27-1 MG TABS tablet Take 1 tablet by mouth daily at 12 noon. 30 tablet 11   Allergies  Allergen Reactions  . Peanuts [Peanut Oil] Anaphylaxis and Hives    I have reviewed patient's Past Medical Hx, Surgical Hx, Family Hx, Social Hx, medications and allergies.   ROS:  Review of Systems  Constitutional: Negative for fever.  Gastrointestinal: Positive for abdominal pain. Negative for constipation, diarrhea, nausea and vomiting.  Genitourinary: Positive for dysuria and frequency.  Musculoskeletal: Negative for myalgias.   Review of Systems   Other systems negative   Physical Exam  Physical Exam Patient Vitals for the past 24 hrs:  BP Temp Pulse Resp Height Weight  09/26/19 2222 109/68 -- 84 -- -- --  09/26/19 2219 -- 98.2 F (36.8 C) -- 16 5\' 4"  (1.626 m) 74.4 kg   Constitutional: Well-developed, well-nourished female in no acute distress.  Cardiovascular: normal rate Respiratory: normal effort GI: Abd soft,  Mildly tender RLQ.  No guarding or rebound. Pos BS x 4 MS: Extremities nontender, no edema, normal ROM Neurologic: Alert and oriented x 4.  GU: Neg CVAT.  PELVIC EXAM:  Bimanual exam: Cervix 0/long/high, firm, anterior, neg CMT, uterus nontender, nonenlarged, adnexa without tenderness, enlargement, or mass  FHT 160 by bedside US  LAB RESULTS Results for orders placed or performed during the hospital encounter of 09/26/19 (from the past 24 hour(s))  CBC with Differential/Platelet     Status: None   Collection Time: 09/26/19 11:38 PM  Result Value Ref Range   WBC 10.1 4.0 - 10.5 K/uL   RBC 4.67 3.87 - 5.11 MIL/uL   Hemoglobin 13.6 12.0 - 15.0 g/dL   HCT 41.0 36.0 - 46.0 %   MCV 87.8 80.0 - 100.0 fL   MCH 29.1 26.0 - 34.0 pg   MCHC 33.2 30.0 - 36.0 g/dL   RDW 13.3 11.5 - 15.5 %   Platelets 236 150 - 400 K/uL   nRBC 0.0 0.0 - 0.2 %   Neutrophils Relative % 68 %   Neutro Abs 7.0 1.7 - 7.7 K/uL   Lymphocytes Relative 23 %   Lymphs Abs 2.3 0.7 - 4.0 K/uL   Monocytes Relative 6 %   Monocytes Absolute 0.6 0.1 - 1.0 K/uL   Eosinophils Relative 2 %   Eosinophils Absolute 0.2 0.0 - 0.5 K/uL   Basophils Relative 0 %   Basophils Absolute 0.0 0.0 - 0.1 K/uL   Immature Granulocytes 1 %   Abs Immature Granulocytes 0.07 0.00 - 0.07 K/uL  Comprehensive metabolic panel     Status: Abnormal   Collection Time: 09/26/19 11:38 PM  Result Value Ref Range   Sodium 136 135 - 145 mmol/L   Potassium 3.8 3.5 - 5.1 mmol/L   Chloride 105 98 - 111 mmol/L   CO2 21 (L) 22 - 32 mmol/L   Glucose, Bld 86 70 - 99 mg/dL   BUN 8  6 - 20 mg/dL   Creatinine, Ser 0.58 0.44 - 1.00 mg/dL   Calcium 8.7 (L) 8.9 - 10.3 mg/dL   Total Protein 6.9 6.5 - 8.1 g/dL   Albumin 3.5 3.5 - 5.0 g/dL   AST 16 15 - 41 U/L   ALT 24 0 - 44 U/L   Alkaline Phosphatase 30 (L) 38 - 126 U/L  Total Bilirubin 0.3 0.3 - 1.2 mg/dL   GFR calc non Af Amer >60 >60 mL/min   GFR calc Af Amer >60 >60 mL/min   Anion gap 10 5 - 15  Urinalysis, Routine w reflex microscopic     Status: Abnormal   Collection Time: 09/26/19 11:50 PM  Result Value Ref Range   Color, Urine YELLOW YELLOW   APPearance HAZY (A) CLEAR   Specific Gravity, Urine 1.027 1.005 - 1.030   pH 5.0 5.0 - 8.0   Glucose, UA NEGATIVE NEGATIVE mg/dL   Hgb urine dipstick NEGATIVE NEGATIVE   Bilirubin Urine NEGATIVE NEGATIVE   Ketones, ur NEGATIVE NEGATIVE mg/dL   Protein, ur NEGATIVE NEGATIVE mg/dL   Nitrite NEGATIVE NEGATIVE   Leukocytes,Ua TRACE (A) NEGATIVE   RBC / HPF 0-5 0 - 5 RBC/hpf   WBC, UA 0-5 0 - 5 WBC/hpf   Bacteria, UA RARE (A) NONE SEEN   Squamous Epithelial / LPF 0-5 0 - 5   Mucus PRESENT     --/--/A POS (02/19 1329)  IMAGING Pt informed that the ultrasound is considered a limited OB ultrasound and is not intended to be a complete ultrasound exam.  Patient also informed that the ultrasound is not being completed with the intent of assessing for fetal or placental anomalies or any pelvic abnormalities.  Explained that the purpose of today's ultrasound is to assess for fetal well being.  Patient acknowledges the purpose of the exam and the limitations of the study.   Live single fetus Active movement  FHR 160  MAU Management/MDM: Ordered labs to rule out serious pathology like appendicitis.  Exam and WBC is not suspicious for appendicitis.  Pt states she has a lot of frequency and dysuria Will send Urine to culture, though it doesn't look too suspicious.  Discussed this may be round ligament stretching  ASSESSMENT Single IUP at [redacted]w[redacted]d Lower abdominal pain   Frequency and dysuria  PLAN Discharge home Urine to culture Reassured exam is benign and baby appears to be doing well.  Pt stable at time of discharge. Encouraged to return here or to other Urgent Care/ED if she develops worsening of symptoms, increase in pain, fever, or other concerning symptoms.    Wynelle Bourgeois CNM, MSN Certified Nurse-Midwife 09/26/2019  11:08 PM

## 2019-09-27 ENCOUNTER — Other Ambulatory Visit: Payer: Self-pay

## 2019-09-27 ENCOUNTER — Ambulatory Visit (INDEPENDENT_AMBULATORY_CARE_PROVIDER_SITE_OTHER): Payer: Managed Care, Other (non HMO) | Admitting: *Deleted

## 2019-09-27 DIAGNOSIS — Z3A09 9 weeks gestation of pregnancy: Secondary | ICD-10-CM

## 2019-09-27 DIAGNOSIS — R102 Pelvic and perineal pain: Secondary | ICD-10-CM

## 2019-09-27 DIAGNOSIS — R350 Frequency of micturition: Secondary | ICD-10-CM

## 2019-09-27 DIAGNOSIS — Z86718 Personal history of other venous thrombosis and embolism: Secondary | ICD-10-CM

## 2019-09-27 DIAGNOSIS — O26891 Other specified pregnancy related conditions, first trimester: Secondary | ICD-10-CM | POA: Diagnosis not present

## 2019-09-27 DIAGNOSIS — O99891 Other specified diseases and conditions complicating pregnancy: Secondary | ICD-10-CM | POA: Diagnosis not present

## 2019-09-27 DIAGNOSIS — M6289 Other specified disorders of muscle: Secondary | ICD-10-CM

## 2019-09-27 DIAGNOSIS — O0990 Supervision of high risk pregnancy, unspecified, unspecified trimester: Secondary | ICD-10-CM

## 2019-09-27 LAB — URINALYSIS, ROUTINE W REFLEX MICROSCOPIC
Bilirubin Urine: NEGATIVE
Glucose, UA: NEGATIVE mg/dL
Hgb urine dipstick: NEGATIVE
Ketones, ur: NEGATIVE mg/dL
Nitrite: NEGATIVE
Protein, ur: NEGATIVE mg/dL
Specific Gravity, Urine: 1.027 (ref 1.005–1.030)
pH: 5 (ref 5.0–8.0)

## 2019-09-27 LAB — CULTURE, OB URINE: Culture: 80000 — AB

## 2019-09-27 LAB — COMPREHENSIVE METABOLIC PANEL
ALT: 24 U/L (ref 0–44)
AST: 16 U/L (ref 15–41)
Albumin: 3.5 g/dL (ref 3.5–5.0)
Alkaline Phosphatase: 30 U/L — ABNORMAL LOW (ref 38–126)
Anion gap: 10 (ref 5–15)
BUN: 8 mg/dL (ref 6–20)
CO2: 21 mmol/L — ABNORMAL LOW (ref 22–32)
Calcium: 8.7 mg/dL — ABNORMAL LOW (ref 8.9–10.3)
Chloride: 105 mmol/L (ref 98–111)
Creatinine, Ser: 0.58 mg/dL (ref 0.44–1.00)
GFR calc Af Amer: >60 mL/min (ref >60)
GFR calc non Af Amer: >60 mL/min (ref >60)
Glucose, Bld: 86 mg/dL (ref 70–99)
Potassium: 3.8 mmol/L (ref 3.5–5.1)
Sodium: 136 mmol/L (ref 135–145)
Total Bilirubin: 0.3 mg/dL (ref 0.3–1.2)
Total Protein: 6.9 g/dL (ref 6.5–8.1)

## 2019-09-27 LAB — CBC WITH DIFFERENTIAL/PLATELET
Abs Immature Granulocytes: 0.07 10*3/uL (ref 0.00–0.07)
Basophils Absolute: 0 10*3/uL (ref 0.0–0.1)
Basophils Relative: 0 %
Eosinophils Absolute: 0.2 10*3/uL (ref 0.0–0.5)
Eosinophils Relative: 2 %
HCT: 41 % (ref 36.0–46.0)
Hemoglobin: 13.6 g/dL (ref 12.0–15.0)
Immature Granulocytes: 1 %
Lymphocytes Relative: 23 %
Lymphs Abs: 2.3 10*3/uL (ref 0.7–4.0)
MCH: 29.1 pg (ref 26.0–34.0)
MCHC: 33.2 g/dL (ref 30.0–36.0)
MCV: 87.8 fL (ref 80.0–100.0)
Monocytes Absolute: 0.6 10*3/uL (ref 0.1–1.0)
Monocytes Relative: 6 %
Neutro Abs: 7 10*3/uL (ref 1.7–7.7)
Neutrophils Relative %: 68 %
Platelets: 236 10*3/uL (ref 150–400)
RBC: 4.67 MIL/uL (ref 3.87–5.11)
RDW: 13.3 % (ref 11.5–15.5)
WBC: 10.1 10*3/uL (ref 4.0–10.5)
nRBC: 0 % (ref 0.0–0.2)

## 2019-09-27 NOTE — Discharge Instructions (Signed)
Round Ligament Pain  The round ligament is a cord of muscle and tissue that helps support the uterus. It can become a source of pain during pregnancy if it becomes stretched or twisted as the baby grows. The pain usually begins in the second trimester (13-28 weeks) of pregnancy, and it can come and go until the baby is delivered. It is not a serious problem, and it does not cause harm to the baby. Round ligament pain is usually a short, sharp, and pinching pain, but it can also be a dull, lingering, and aching pain. The pain is felt in the lower side of the abdomen or in the groin. It usually starts deep in the groin and moves up to the outside of the hip area. The pain may occur when you:  Suddenly change position, such as quickly going from a sitting to standing position.  Roll over in bed.  Cough or sneeze.  Do physical activity. Follow these instructions at home:   Watch your condition for any changes.  When the pain starts, relax. Then try any of these methods to help with the pain: ? Sitting down. ? Flexing your knees up to your abdomen. ? Lying on your side with one pillow under your abdomen and another pillow between your legs. ? Sitting in a warm bath for 15-20 minutes or until the pain goes away.  Take over-the-counter and prescription medicines only as told by your health care provider.  Move slowly when you sit down or stand up.  Avoid long walks if they cause pain.  Stop or reduce your physical activities if they cause pain.  Keep all follow-up visits as told by your health care provider. This is important. Contact a health care provider if:  Your pain does not go away with treatment.  You feel pain in your back that you did not have before.  Your medicine is not helping. Get help right away if:  You have a fever or chills.  You develop uterine contractions.  You have vaginal bleeding.  You have nausea or vomiting.  You have diarrhea.  You have pain  when you urinate. Summary  Round ligament pain is felt in the lower abdomen or groin. It is usually a short, sharp, and pinching pain. It can also be a dull, lingering, and aching pain.  This pain usually begins in the second trimester (13-28 weeks). It occurs because the uterus is stretching with the growing baby, and it is not harmful to the baby.  You may notice the pain when you suddenly change position, when you cough or sneeze, or during physical activity.  Relaxing, flexing your knees to your abdomen, lying on one side, or taking a warm bath may help to get rid of the pain.  Get help from your health care provider if the pain does not go away or if you have vaginal bleeding, nausea, vomiting, diarrhea, or painful urination. This information is not intended to replace advice given to you by your health care provider. Make sure you discuss any questions you have with your health care provider. Document Revised: 12/07/2017 Document Reviewed: 12/07/2017 Elsevier Patient Education  2020 [employer].  First Trimester of Pregnancy The first trimester of pregnancy is from week 1 until the end of week 13 (months 1 through 3). A week after a sperm fertilizes an egg, the egg will implant on the wall of the uterus. This embryo will begin to develop into a baby. Genes from you and your  partner will form the baby. The female genes will determine whether the baby will be a boy or a girl. At 6-8 weeks, the eyes and face will be formed, and the heartbeat can be seen on ultrasound. At the end of 12 weeks, all the baby's organs will be formed. Now that you are pregnant, you will want to do everything you can to have a healthy baby. Two of the most important things are to get good prenatal care and to follow your health care provider's instructions. Prenatal care is all the medical care you receive before the baby's birth. This care will help prevent, find, and treat any problems during the pregnancy and  childbirth. Body changes during your first trimester Your body goes through many changes during pregnancy. The changes vary from woman to woman.  You may gain or lose a couple of pounds at first.  You may feel sick to your stomach (nauseous) and you may throw up (vomit). If the vomiting is uncontrollable, call your health care provider.  You may tire easily.  You may develop headaches that can be relieved by medicines. All medicines should be approved by your health care provider.  You may urinate more often. Painful urination may mean you have a bladder infection.  You may develop heartburn as a result of your pregnancy.  You may develop constipation because certain hormones are causing the muscles that push stool through your intestines to slow down.  You may develop hemorrhoids or swollen veins (varicose veins).  Your breasts may begin to grow larger and become tender. Your nipples may stick out more, and the tissue that surrounds them (areola) may become darker.  Your gums may bleed and may be sensitive to brushing and flossing.  Dark spots or blotches (chloasma, mask of pregnancy) may develop on your face. This will likely fade after the baby is born.  Your menstrual periods will stop.  You may have a loss of appetite.  You may develop cravings for certain kinds of food.  You may have changes in your emotions from day to day, such as being excited to be pregnant or being concerned that something may go wrong with the pregnancy and baby.  You may have more vivid and strange dreams.  You may have changes in your hair. These can include thickening of your hair, rapid growth, and changes in texture. Some women also have hair loss during or after pregnancy, or hair that feels dry or thin. Your hair will most likely return to normal after your baby is born. What to expect at prenatal visits During a routine prenatal visit:  You will be weighed to make sure you and the baby are  growing normally.  Your blood pressure will be taken.  Your abdomen will be measured to track your baby's growth.  The fetal heartbeat will be listened to between weeks 10 and 14 of your pregnancy.  Test results from any previous visits will be discussed. Your health care provider may ask you:  How you are feeling.  If you are feeling the baby move.  If you have had any abnormal symptoms, such as leaking fluid, bleeding, severe headaches, or abdominal cramping.  If you are using any tobacco products, including cigarettes, chewing tobacco, and electronic cigarettes.  If you have any questions. Other tests that may be performed during your first trimester include:  Blood tests to find your blood type and to check for the presence of any previous infections. The tests will  also be used to check for low iron levels (anemia) and protein on red blood cells (Rh antibodies). Depending on your risk factors, or if you previously had diabetes during pregnancy, you may have tests to check for high blood sugar that affects pregnant women (gestational diabetes).  Urine tests to check for infections, diabetes, or protein in the urine.  An ultrasound to confirm the proper growth and development of the baby.  Fetal screens for spinal cord problems (spina bifida) and Down syndrome.  HIV (human immunodeficiency virus) testing. Routine prenatal testing includes screening for HIV, unless you choose not to have this test.  You may need other tests to make sure you and the baby are doing well. Follow these instructions at home: Medicines  Follow your health care provider's instructions regarding medicine use. Specific medicines may be either safe or unsafe to take during pregnancy.  Take a prenatal vitamin that contains at least 600 micrograms (mcg) of folic acid.  If you develop constipation, try taking a stool softener if your health care provider approves. Eating and drinking   Eat a balanced  diet that includes fresh fruits and vegetables, whole grains, good sources of protein such as meat, eggs, or tofu, and low-fat dairy. Your health care provider will help you determine the amount of weight gain that is right for you.  Avoid raw meat and uncooked cheese. These carry germs that can cause birth defects in the baby.  Eating four or five small meals rather than three large meals a day may help relieve nausea and vomiting. If you start to feel nauseous, eating a few soda crackers can be helpful. Drinking liquids between meals, instead of during meals, also seems to help ease nausea and vomiting.  Limit foods that are high in fat and processed sugars, such as fried and sweet foods.  To prevent constipation: ? Eat foods that are high in fiber, such as fresh fruits and vegetables, whole grains, and beans. ? Drink enough fluid to keep your urine clear or pale yellow. Activity  Exercise only as directed by your health care provider. Most women can continue their usual exercise routine during pregnancy. Try to exercise for 30 minutes at least 5 days a week. Exercising will help you: ? Control your weight. ? Stay in shape. ? Be prepared for labor and delivery.  Experiencing pain or cramping in the lower abdomen or lower back is a good sign that you should stop exercising. Check with your health care provider before continuing with normal exercises.  Try to avoid standing for long periods of time. Move your legs often if you must stand in one place for a long time.  Avoid heavy lifting.  Wear low-heeled shoes and practice good posture.  You may continue to have sex unless your health care provider tells you not to. Relieving pain and discomfort  Wear a good support bra to relieve breast tenderness.  Take warm sitz baths to soothe any pain or discomfort caused by hemorrhoids. Use hemorrhoid cream if your health care provider approves.  Rest with your legs elevated if you have leg  cramps or low back pain.  If you develop varicose veins in your legs, wear support hose. Elevate your feet for 15 minutes, 3-4 times a day. Limit salt in your diet. Prenatal care  Schedule your prenatal visits by the twelfth week of pregnancy. They are usually scheduled monthly at first, then more often in the last 2 months before delivery.  Write down your  questions. Take them to your prenatal visits.  Keep all your prenatal visits as told by your health care provider. This is important. Safety  Wear your seat belt at all times when driving.  Make a list of emergency phone numbers, including numbers for family, friends, the hospital, and police and fire departments. General instructions  Ask your health care provider for a referral to a local prenatal education class. Begin classes no later than the beginning of month 6 of your pregnancy.  Ask for help if you have counseling or nutritional needs during pregnancy. Your health care provider can offer advice or refer you to specialists for help with various needs.  Do not use hot tubs, steam rooms, or saunas.  Do not douche or use tampons or scented sanitary pads.  Do not cross your legs for long periods of time.  Avoid cat litter boxes and soil used by cats. These carry germs that can cause birth defects in the baby and possibly loss of the fetus by miscarriage or stillbirth.  Avoid all smoking, herbs, alcohol, and medicines not prescribed by your health care provider. Chemicals in these products affect the formation and growth of the baby.  Do not use any products that contain nicotine or tobacco, such as cigarettes and e-cigarettes. If you need help quitting, ask your health care provider. You may receive counseling support and other resources to help you quit.  Schedule a dentist appointment. At home, brush your teeth with a soft toothbrush and be gentle when you floss. Contact a health care provider if:  You have  dizziness.  You have mild pelvic cramps, pelvic pressure, or nagging pain in the abdominal area.  You have persistent nausea, vomiting, or diarrhea.  You have a bad smelling vaginal discharge.  You have pain when you urinate.  You notice increased swelling in your face, hands, legs, or ankles.  You are exposed to fifth disease or chickenpox.  You are exposed to [place] measles (rubella) and have never had it. Get help right away if:  You have a fever.  You are leaking fluid from your vagina.  You have spotting or bleeding from your vagina.  You have severe abdominal cramping or pain.  You have rapid weight gain or loss.  You vomit blood or material that looks like coffee grounds.  You develop a severe headache.  You have shortness of breath.  You have any kind of trauma, such as from a fall or a car accident. Summary  The first trimester of pregnancy is from week 1 until the end of week 13 (months 1 through 3).  Your body goes through many changes during pregnancy. The changes vary from woman to woman.  You will have routine prenatal visits. During those visits, your health care provider will examine you, discuss any test results you may have, and talk with you about how you are feeling. This information is not intended to replace advice given to you by your health care provider. Make sure you discuss any questions you have with your health care provider. Document Revised: 06/03/2017 Document Reviewed: 06/02/2016 Elsevier Patient Education  2020 [employer].

## 2019-09-27 NOTE — Progress Notes (Signed)
Marie Williams CNM in earlier to discuss test results and d/c plan. Written and verbal d/c instructions given and understanding voiced °

## 2019-09-27 NOTE — Patient Instructions (Signed)

## 2019-09-27 NOTE — Progress Notes (Signed)
I connected with  [place] on 09/27/19 at  2:30 PM EDT by telephone and verified that I am speaking with the correct person using two identifiers.   I discussed the limitations, risks, security and privacy concerns of performing an evaluation and management service by telephone and the availability of in person appointments. I also discussed with the patient that there may be a patient responsible charge related to this service. The patient expressed understanding and agreed to proceed.  I explained I am completing her New OB Intake today. We discussed Her EDD and that it is based on  sure LMP . I reviewed her allergies, meds, OB History, Medical /Surgical history, and appropriate screenings. I informed her of [facility] services.  I explained I will send her the [employer] app and app was sent to her while on phone.  I explained we will ask that she take her blood pressure weekly and asked if she can buy a blood pressure cuff because her insurance does not cover RX for blood pressure cuff. She states she can buy a cuff.  I asked her to bring the blood pressure cuff with her to her first ob appointment so we can show her how to use it. Explained  then we will have her take her blood pressure weekly and enter into the app. I explained she will have some visits in office and some virtually. She already has Sports coach. I reviewed her new ob  appointment date/ time with her , our location and to wear mask, no visitors.  I explained she will have a pelvic exam, remaining ob bloodwork not done in MAU ,hemoglobin a1C, cbg , and  genetic testing if desired,- she does want a panorama. I scheduled an [place] at 19 weeks and gave her the appointment. She voices understanding.  Elaysha Bevard,RN 09/27/2019  2:30 PM

## 2019-09-28 ENCOUNTER — Other Ambulatory Visit: Payer: Self-pay | Admitting: Advanced Practice Midwife

## 2019-09-28 ENCOUNTER — Telehealth: Payer: Self-pay | Admitting: *Deleted

## 2019-09-28 ENCOUNTER — Encounter: Payer: Self-pay | Admitting: Advanced Practice Midwife

## 2019-09-28 DIAGNOSIS — R8271 Bacteriuria: Secondary | ICD-10-CM

## 2019-09-28 MED ORDER — PENICILLIN V POTASSIUM 500 MG PO TABS: 500.0000 mg | ORAL_TABLET | Freq: Four times a day (QID) | ORAL | 0 refills | Status: DC

## 2019-09-28 NOTE — Progress Notes (Signed)
GBS in urine Rx PCN

## 2019-09-28 NOTE — Progress Notes (Signed)
Patient seen and assessed by nursing staff.  Agree with documentation and plan.  

## 2019-09-28 NOTE — Telephone Encounter (Signed)
-----   Message from Aviva Signs, CNM sent at 09/28/2019  2:21 AM EDT ----- Regarding: notify pt of PCN Rx for GBS urine I sent in Rx for PCN Can you notify her? I am working night shift so don't want to call this late!  Hilda Lias

## 2019-09-28 NOTE — Telephone Encounter (Signed)
I called Olivia Hayes and informed her of UTI and RX for PCN sent to her pharmacy and reviewed instructions with her. She voices understanding. Harla Mensch,RN

## 2019-10-04 ENCOUNTER — Encounter: Payer: Self-pay | Admitting: Obstetrics and Gynecology

## 2019-10-10 ENCOUNTER — Ambulatory Visit (INDEPENDENT_AMBULATORY_CARE_PROVIDER_SITE_OTHER): Payer: Managed Care, Other (non HMO) | Admitting: Obstetrics & Gynecology

## 2019-10-10 ENCOUNTER — Other Ambulatory Visit: Payer: Self-pay

## 2019-10-10 VITALS — BP 109/57 | HR 82 | Wt 169.0 lb

## 2019-10-10 DIAGNOSIS — O0990 Supervision of high risk pregnancy, unspecified, unspecified trimester: Secondary | ICD-10-CM

## 2019-10-10 MED ORDER — CETIRIZINE HCL 5 MG PO TABS: 5.0000 mg | ORAL_TABLET | Freq: Every day | ORAL | 1 refills | Status: DC

## 2019-10-10 MED ORDER — ONDANSETRON 4 MG PO TBDP: 4.0000 mg | ORAL_TABLET | Freq: Three times a day (TID) | ORAL | 0 refills | Status: DC | PRN

## 2019-10-10 MED ORDER — TRIAMCINOLONE ACETONIDE 55 MCG/ACT NA AERO: 1.0000 | INHALATION_SPRAY | Freq: Two times a day (BID) | NASAL | 2 refills | Status: DC

## 2019-10-10 NOTE — Patient Instructions (Signed)
First Trimester of Pregnancy The first trimester of pregnancy is from week 1 until the end of week 13 (months 1 through 3). A week after a sperm fertilizes an egg, the egg will implant on the wall of the uterus. This embryo will begin to develop into a baby. Genes from you and your partner will form the baby. The female genes will determine whether the baby will be a boy or a girl. At 6-8 weeks, the eyes and face will be formed, and the heartbeat can be seen on ultrasound. At the end of 12 weeks, all the baby's organs will be formed. Now that you are pregnant, you will want to do everything you can to have a healthy baby. Two of the most important things are to get good prenatal care and to follow your health care provider's instructions. Prenatal care is all the medical care you receive before the baby's birth. This care will help prevent, find, and treat any problems during the pregnancy and childbirth. Body changes during your first trimester Your body goes through many changes during pregnancy. The changes vary from woman to woman.  You may gain or lose a couple of pounds at first.  You may feel sick to your stomach (nauseous) and you may throw up (vomit). If the vomiting is uncontrollable, call your health care provider.  You may tire easily.  You may develop headaches that can be relieved by medicines. All medicines should be approved by your health care provider.  You may urinate more often. Painful urination may mean you have a bladder infection.  You may develop heartburn as a result of your pregnancy.  You may develop constipation because certain hormones are causing the muscles that push stool through your intestines to slow down.  You may develop hemorrhoids or swollen veins (varicose veins).  Your breasts may begin to grow larger and become tender. Your nipples may stick out more, and the tissue that surrounds them (areola) may become darker.  Your gums may bleed and may be  sensitive to brushing and flossing.  Dark spots or blotches (chloasma, mask of pregnancy) may develop on your face. This will likely fade after the baby is born.  Your menstrual periods will stop.  You may have a loss of appetite.  You may develop cravings for certain kinds of food.  You may have changes in your emotions from day to day, such as being excited to be pregnant or being concerned that something may go wrong with the pregnancy and baby.  You may have more vivid and strange dreams.  You may have changes in your hair. These can include thickening of your hair, rapid growth, and changes in texture. Some women also have hair loss during or after pregnancy, or hair that feels dry or thin. Your hair will most likely return to normal after your baby is born. What to expect at prenatal visits During a routine prenatal visit:  You will be weighed to make sure you and the baby are growing normally.  Your blood pressure will be taken.  Your abdomen will be measured to track your baby's growth.  The fetal heartbeat will be listened to between weeks 10 and 14 of your pregnancy.  Test results from any previous visits will be discussed. Your health care provider may ask you:  How you are feeling.  If you are feeling the baby move.  If you have had any abnormal symptoms, such as leaking fluid, bleeding, severe headaches, or abdominal   cramping.  If you are using any tobacco products, including cigarettes, chewing tobacco, and electronic cigarettes.  If you have any questions. Other tests that may be performed during your first trimester include:  Blood tests to find your blood type and to check for the presence of any previous infections. The tests will also be used to check for low iron levels (anemia) and protein on red blood cells (Rh antibodies). Depending on your risk factors, or if you previously had diabetes during pregnancy, you may have tests to check for high blood sugar  that affects pregnant women (gestational diabetes).  Urine tests to check for infections, diabetes, or protein in the urine.  An ultrasound to confirm the proper growth and development of the baby.  Fetal screens for spinal cord problems (spina bifida) and Down syndrome.  HIV (human immunodeficiency virus) testing. Routine prenatal testing includes screening for HIV, unless you choose not to have this test.  You may need other tests to make sure you and the baby are doing well. Follow these instructions at home: Medicines  Follow your health care provider's instructions regarding medicine use. Specific medicines may be either safe or unsafe to take during pregnancy.  Take a prenatal vitamin that contains at least 600 micrograms (mcg) of folic acid.  If you develop constipation, try taking a stool softener if your health care provider approves. Eating and drinking   Eat a balanced diet that includes fresh fruits and vegetables, whole grains, good sources of protein such as meat, eggs, or tofu, and low-fat dairy. Your health care provider will help you determine the amount of weight gain that is right for you.  Avoid raw meat and uncooked cheese. These carry germs that can cause birth defects in the baby.  Eating four or five small meals rather than three large meals a day may help relieve nausea and vomiting. If you start to feel nauseous, eating a few soda crackers can be helpful. Drinking liquids between meals, instead of during meals, also seems to help ease nausea and vomiting.  Limit foods that are high in fat and processed sugars, such as fried and sweet foods.  To prevent constipation: ? Eat foods that are high in fiber, such as fresh fruits and vegetables, whole grains, and beans. ? Drink enough fluid to keep your urine clear or pale yellow. Activity  Exercise only as directed by your health care provider. Most women can continue their usual exercise routine during  pregnancy. Try to exercise for 30 minutes at least 5 days a week. Exercising will help you: ? Control your weight. ? Stay in shape. ? Be prepared for labor and delivery.  Experiencing pain or cramping in the lower abdomen or lower back is a good sign that you should stop exercising. Check with your health care provider before continuing with normal exercises.  Try to avoid standing for long periods of time. Move your legs often if you must stand in one place for a long time.  Avoid heavy lifting.  Wear low-heeled shoes and practice good posture.  You may continue to have sex unless your health care provider tells you not to. Relieving pain and discomfort  Wear a good support bra to relieve breast tenderness.  Take warm sitz baths to soothe any pain or discomfort caused by hemorrhoids. Use hemorrhoid cream if your health care provider approves.  Rest with your legs elevated if you have leg cramps or low back pain.  If you develop varicose veins in   your legs, wear support hose. Elevate your feet for 15 minutes, 3-4 times a day. Limit salt in your diet. Prenatal care  Schedule your prenatal visits by the twelfth week of pregnancy. They are usually scheduled monthly at first, then more often in the last 2 months before delivery.  Write down your questions. Take them to your prenatal visits.  Keep all your prenatal visits as told by your health care provider. This is important. Safety  Wear your seat belt at all times when driving.  Make a list of emergency phone numbers, including numbers for family, friends, the hospital, and police and fire departments. General instructions  Ask your health care provider for a referral to a local prenatal education class. Begin classes no later than the beginning of month 6 of your pregnancy.  Ask for help if you have counseling or nutritional needs during pregnancy. Your health care provider can offer advice or refer you to specialists for help  with various needs.  Do not use hot tubs, steam rooms, or saunas.  Do not douche or use tampons or scented sanitary pads.  Do not cross your legs for long periods of time.  Avoid cat litter boxes and soil used by cats. These carry germs that can cause birth defects in the baby and possibly loss of the fetus by miscarriage or stillbirth.  Avoid all smoking, herbs, alcohol, and medicines not prescribed by your health care provider. Chemicals in these products affect the formation and growth of the baby.  Do not use any products that contain nicotine or tobacco, such as cigarettes and e-cigarettes. If you need help quitting, ask your health care provider. You may receive counseling support and other resources to help you quit.  Schedule a dentist appointment. At home, brush your teeth with a soft toothbrush and be gentle when you floss. Contact a health care provider if:  You have dizziness.  You have mild pelvic cramps, pelvic pressure, or nagging pain in the abdominal area.  You have persistent nausea, vomiting, or diarrhea.  You have a bad smelling vaginal discharge.  You have pain when you urinate.  You notice increased swelling in your face, hands, legs, or ankles.  You are exposed to fifth disease or chickenpox.  You are exposed to German measles (rubella) and have never had it. Get help right away if:  You have a fever.  You are leaking fluid from your vagina.  You have spotting or bleeding from your vagina.  You have severe abdominal cramping or pain.  You have rapid weight gain or loss.  You vomit blood or material that looks like coffee grounds.  You develop a severe headache.  You have shortness of breath.  You have any kind of trauma, such as from a fall or a car accident. Summary  The first trimester of pregnancy is from week 1 until the end of week 13 (months 1 through 3).  Your body goes through many changes during pregnancy. The changes vary from  woman to woman.  You will have routine prenatal visits. During those visits, your health care provider will examine you, discuss any test results you may have, and talk with you about how you are feeling. This information is not intended to replace advice given to you by your health care provider. Make sure you discuss any questions you have with your health care provider. Document Revised: 06/03/2017 Document Reviewed: 06/02/2016 Elsevier Patient Education  2020 Elsevier Inc.  

## 2019-10-10 NOTE — Progress Notes (Signed)
  Subjective:    Olivia Hayes is a G2P0010 [redacted]w[redacted]d being seen today for her first obstetrical visit.  Her obstetrical history is significant for group B strep colonizer and HSV, h/o DVT currently on lovenox.. Patient does intend to breast feed. Pregnancy history fully reviewed.  Patient reports nausea, no bleeding, no contractions, no cramping and no leaking.  Vitals:   10/10/19 1021  BP: (!) 109/57  Pulse: 82  Weight: 76.7 kg    HISTORY: OB History  Gravida Para Term Preterm AB Living  2 0 0 0 1 0  SAB TAB Ectopic Multiple Live Births  0 1 0 0 0    # Outcome Date GA Lbr Len/2nd Weight Sex Delivery Anes PTL Lv  2 Current           1 TAB 10/2018           Past Medical History:  Diagnosis Date  . Asthma    exercise induced  . BV (bacterial vaginosis)   . DVT (deep venous thrombosis) (HCC)    provoked by [facility] OCPs   . 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 Pelvic floor dysfunction   . Yeast infection    Past Surgical History:  Procedure Laterality Date  . ABDOMINAL HERNIA REPAIR  age 57  . INDUCED ABORTION     Family History  Problem Relation Age of Onset  . Thyroid disease Mother      Exam    Uterus:     Pelvic Exam:    Perineum: No Hemorrhoids   Vulva: Deferred   Vagina:  Deferred   Skin: normal coloration and turgor, no rashes    Neurologic: oriented, normal, negative   Extremities: normal strength, tone, and muscle mass   HEENT PERRLA   Neck no masses   Cardiovascular: no murmurs or gallops   Respiratory:  appears well, vitals normal, no respiratory distress, acyanotic, normal RR, ear and throat exam is normal, neck free of mass or lymphadenopathy, chest clear, no wheezing, crepitations, rhonchi, normal symmetric air entry   Abdomen: soft, non-tender; bowel sounds normal; no masses,  no organomegaly   Urinary: exam limited by anxiety      Assessment:    Pregnancy: G2P0010 Patient Active Problem List   Diagnosis Date  Noted  . GBS bacteriuria 09/28/2019  . Supervision of high risk pregnancy, antepartum 09/27/2019  . Pelvic floor dysfunction   . History of DVT (deep vein thrombosis) 08/24/2019  . Herpes simplex 10/15/2018  . Tinea corporis 01/06/2018  . Encounter for wellness examination 01/06/2018  . Vaccine for diphtheria-tetanus-pertussis, combined 01/06/2018  . ADHD (attention deficit hyperactivity disorder) evaluation 01/06/2018  . Vaginal discharge 12/26/2017  . Strep tonsillitis 04/26/2017  . Globus sensation 07/11/2015  . Thyroid disorder screening 07/11/2015  . Acne 03/28/2013  . Multiple allergies 02/21/2013  . OVERWEIGHT 04/03/2010        Plan:     Initial labs drawn. Prenatal vitamins. Problem list reviewed and updated. Genetic Screening discussed Integrated Screen: requested.  Ultrasound discussed; fetal survey: requested.  Follow up in 4 weeks. 80% of 30 min visit spent on counseling and coordination of care.  Continue Lovenox for DVT hx.    Malachy Chamber 10/10/2019

## 2019-10-11 LAB — OBSTETRIC PANEL, INCLUDING HIV
Antibody Screen: NEGATIVE
Basophils Absolute: 0 10*3/uL (ref 0.0–0.2)
Basos: 0 %
EOS (ABSOLUTE): 0.2 10*3/uL (ref 0.0–0.4)
Eos: 2 %
HIV Screen 4th Generation wRfx: NONREACTIVE
Hematocrit: 40.5 % (ref 34.0–46.6)
Hemoglobin: 13.6 g/dL (ref 11.1–15.9)
Hepatitis B Surface Ag: NEGATIVE
Immature Grans (Abs): 0.1 10*3/uL (ref 0.0–0.1)
Immature Granulocytes: 1 %
Lymphocytes Absolute: 1.5 10*3/uL (ref 0.7–3.1)
Lymphs: 16 %
MCH: 29.4 pg (ref 26.6–33.0)
MCHC: 33.6 g/dL (ref 31.5–35.7)
MCV: 88 fL (ref 79–97)
Monocytes Absolute: 0.5 10*3/uL (ref 0.1–0.9)
Monocytes: 6 %
Neutrophils Absolute: 7.1 10*3/uL — ABNORMAL HIGH (ref 1.4–7.0)
Neutrophils: 75 %
Platelets: 207 10*3/uL (ref 150–450)
RBC: 4.62 x10E6/uL (ref 3.77–5.28)
RDW: 12.8 % (ref 11.7–15.4)
RPR Ser Ql: NONREACTIVE
Rh Factor: POSITIVE
Rubella Antibodies, IGG: <0.9 index — ABNORMAL LOW (ref >0.99)
WBC: 9.3 10*3/uL (ref 3.4–10.8)

## 2019-10-11 LAB — HEMOGLOBIN A1C
Est. average glucose Bld gHb Est-mCnc: 97 mg/dL
Hgb A1c MFr Bld: 5 % (ref 4.8–5.6)

## 2019-10-16 NOTE — BH Specialist Note (Signed)
Pt did not arrive to video visit and did not answer the phone ; Left HIPPA-compliant message to call back Asher Muir from [facility] for [employer] at [phone].  left MyChart message for patient.    Integrated Behavioral Health via Telemedicine Video Visit  10/16/2019 Devonda Pequignot [phone]   Rae Lips  Depression screen [facility] 2/9 10/10/2019 09/27/2019 07/18/2018 04/26/2018 01/03/2018  Decreased Interest 1 1 0 0 0  Down, Depressed, Hopeless 0 1 0 0 0  PHQ - 2 Score 1 2 0 0 0  Altered sleeping 2 0 - - -  Tired, decreased energy 3 3 - - -  Change in appetite 2 1 - - -  Feeling bad or failure about yourself  0 0 - - -  Trouble concentrating 0 0 - - -  Moving slowly or fidgety/restless 0 0 - - -  Suicidal thoughts 0 0 - - -  PHQ-9 Score 8 6 - - -  Difficult doing work/chores Not difficult at all - - - -   GAD 7 : Generalized Anxiety Score 10/10/2019 09/27/2019  Nervous, Anxious, on Edge 2 1  Control/stop worrying 2 0  Worry too much - different things 2 0  Trouble relaxing 2 0  Restless 0 0  Easily annoyed or irritable 3 3  Afraid - awful might happen 1 1  Total GAD 7 Score 12 5  Anxiety Difficulty Not difficult at all -

## 2019-10-17 ENCOUNTER — Encounter: Payer: Self-pay | Admitting: *Deleted

## 2019-10-17 ENCOUNTER — Ambulatory Visit (INDEPENDENT_AMBULATORY_CARE_PROVIDER_SITE_OTHER): Payer: 59 | Admitting: Clinical

## 2019-10-17 DIAGNOSIS — Z5329 Procedure and treatment not carried out because of patient's decision for other reasons: Secondary | ICD-10-CM

## 2019-10-17 DIAGNOSIS — Z91199 Patient's noncompliance with other medical treatment and regimen due to unspecified reason: Secondary | ICD-10-CM

## 2019-10-30 ENCOUNTER — Encounter: Payer: Self-pay | Admitting: *Deleted

## 2019-11-05 ENCOUNTER — Telehealth: Payer: Self-pay

## 2019-11-05 NOTE — Telephone Encounter (Signed)
Called pt as she had requested from MyChart message.  I left voicemail stating that if he could please return call to the office or she can send her questions or concerns through MyChart.    Addison Naegeli, RN

## 2019-11-12 ENCOUNTER — Encounter: Payer: Self-pay | Admitting: Obstetrics and Gynecology

## 2019-11-12 ENCOUNTER — Other Ambulatory Visit: Payer: Self-pay

## 2019-11-12 ENCOUNTER — Telehealth (INDEPENDENT_AMBULATORY_CARE_PROVIDER_SITE_OTHER): Payer: Managed Care, Other (non HMO) | Admitting: Obstetrics and Gynecology

## 2019-11-12 DIAGNOSIS — B009 Herpesviral infection, unspecified: Secondary | ICD-10-CM

## 2019-11-12 DIAGNOSIS — R8271 Bacteriuria: Secondary | ICD-10-CM

## 2019-11-12 DIAGNOSIS — O99820 Streptococcus B carrier state complicating pregnancy: Secondary | ICD-10-CM

## 2019-11-12 DIAGNOSIS — Z86718 Personal history of other venous thrombosis and embolism: Secondary | ICD-10-CM

## 2019-11-12 DIAGNOSIS — O98512 Other viral diseases complicating pregnancy, second trimester: Secondary | ICD-10-CM

## 2019-11-12 DIAGNOSIS — Z3A16 16 weeks gestation of pregnancy: Secondary | ICD-10-CM

## 2019-11-12 DIAGNOSIS — O0990 Supervision of high risk pregnancy, unspecified, unspecified trimester: Secondary | ICD-10-CM

## 2019-11-12 NOTE — Progress Notes (Signed)
I connected with  Olivia Hayes on 11/12/19 at 1505 by MyChart and verified that I am speaking with the correct person using two identifiers.   I discussed the limitations, risks, security and privacy concerns of performing an evaluation and management service by telephone and the availability of in person appointments. I also discussed with the patient that there may be a patient responsible charge related to this service. The patient expressed understanding and agreed to proceed.  Pt reports not completing penicillin v potassium antibiotic course. Confusion about when to take medication. Pt does not have BP cuff with her at this time. Reports throbbing headache, last one occurred two days ago. Reports dizziness with position changes about two weeks ago.   Marjo Bicker, RN 11/12/2019  3:05 PM

## 2019-11-12 NOTE — Progress Notes (Signed)
   OBSTETRICS PRENATAL VIRTUAL VISIT ENCOUNTER NOTE  Provider location: [facility] [place]   I connected with Olivia Hayes on 11/12/19 at  2:55 PM EDT by MyChart Video Encounter at home and verified that I am speaking with the correct person using two identifiers.   I discussed the limitations, risks, security and privacy concerns of performing an evaluation and management service virtually and the availability of in person appointments. I also discussed with the patient that there may be a patient responsible charge related to this service. The patient expressed understanding and agreed to proceed. Subjective:  Olivia Hayes is a 24 y.o. G2P0010 at [redacted]w[redacted]d being seen today for ongoing prenatal care.  She is currently monitored for the following issues for this high-risk pregnancy and has OVERWEIGHT; Multiple allergies; Acne; Globus sensation; Thyroid disorder screening; Strep tonsillitis; Vaginal discharge; Tinea corporis; Encounter for wellness examination; Vaccine for diphtheria-tetanus-pertussis, combined; ADHD (attention deficit hyperactivity disorder) evaluation; History of DVT (deep vein thrombosis); Supervision of high risk pregnancy, antepartum; Herpes simplex; Pelvic floor dysfunction; and GBS bacteriuria on their problem list.  Patient reports pain with walking, concerned about sciatic nerve pain.  Contractions: Not present. Vag. Bleeding: None.  Movement: Absent. Denies any leaking of fluid.   The following portions of the patient's history were reviewed and updated as appropriate: allergies, current medications, past family history, past medical history, past social history, past surgical history and problem list.   Objective:  There were no vitals filed for this visit.  Fetal Status:     Movement: Absent     General:  Alert, oriented and cooperative. Patient is in no acute distress.  Respiratory: Normal respiratory effort, no problems with respiration noted    Mental Status: Normal mood and affect. Normal behavior. Normal judgment and thought content.  Rest of physical exam deferred due to type of encounter  Imaging: No results found.  Assessment and Plan:  Pregnancy: G2P0010 at [redacted]w[redacted]d  1. Supervision of high risk pregnancy, antepartum Has some leg pain, concerned about sciatica, sent info on stretching Patient has not had COVID vaccine. Reviewed risks/benefits in pregnancy/breastfeeding and gave info. She will consider and call with questions. She declines for now.  2. History of DVT (deep vein thrombosis) With OCP use On lovenox BID, to cont until 6 weeks after pregnancy  3. Herpes simplex ppx 36 weeks  4. GBS bacteriuria ppx in labor   Preterm labor symptoms and general obstetric precautions including but not limited to vaginal bleeding, contractions, leaking of fluid and fetal movement were reviewed in detail with the patient. I discussed the assessment and treatment plan with the patient. The patient was provided an opportunity to ask questions and all were answered. The patient agreed with the plan and demonstrated an understanding of the instructions. The patient was advised to call back or seek an in-person office evaluation/go to [facility] at [facility] for any urgent or concerning symptoms. Please refer to After Visit Summary for other counseling recommendations.   I provided 15 minutes of face-to-face time during this encounter.  Return in about 4 weeks (around 12/10/2019) for high OB, virtual.  Future Appointments  Date Time Provider Department Center  11/30/2019  8:30 AM [facility] NURSE [facility]  11/30/2019  8:30 AM [facility]    Conan Bowens, MD [facility] for [facility], [facility]

## 2019-11-22 ENCOUNTER — Encounter: Payer: Managed Care, Other (non HMO) | Admitting: Obstetrics and Gynecology

## 2019-11-30 ENCOUNTER — Ambulatory Visit: Payer: Managed Care, Other (non HMO)

## 2019-11-30 ENCOUNTER — Other Ambulatory Visit: Payer: Self-pay

## 2019-12-10 ENCOUNTER — Other Ambulatory Visit: Payer: Self-pay

## 2019-12-10 ENCOUNTER — Telehealth (INDEPENDENT_AMBULATORY_CARE_PROVIDER_SITE_OTHER): Payer: Managed Care, Other (non HMO) | Admitting: Nurse Practitioner

## 2019-12-10 ENCOUNTER — Encounter: Payer: Self-pay | Admitting: Nurse Practitioner

## 2019-12-10 DIAGNOSIS — O0990 Supervision of high risk pregnancy, unspecified, unspecified trimester: Secondary | ICD-10-CM

## 2019-12-10 DIAGNOSIS — R8271 Bacteriuria: Secondary | ICD-10-CM

## 2019-12-10 DIAGNOSIS — O0992 Supervision of high risk pregnancy, unspecified, second trimester: Secondary | ICD-10-CM

## 2019-12-10 DIAGNOSIS — Z1331 Encounter for screening for depression: Secondary | ICD-10-CM

## 2019-12-10 DIAGNOSIS — O99820 Streptococcus B carrier state complicating pregnancy: Secondary | ICD-10-CM

## 2019-12-10 DIAGNOSIS — O26892 Other specified pregnancy related conditions, second trimester: Secondary | ICD-10-CM

## 2019-12-10 DIAGNOSIS — Z3A20 20 weeks gestation of pregnancy: Secondary | ICD-10-CM

## 2019-12-10 DIAGNOSIS — R519 Headache, unspecified: Secondary | ICD-10-CM

## 2019-12-10 DIAGNOSIS — M6289 Other specified disorders of muscle: Secondary | ICD-10-CM

## 2019-12-10 NOTE — Progress Notes (Signed)
I connected with@ on 12/10/19 at  1:35 PM EDT by: MyChart video and verified that I am speaking with the correct person using two identifiers.  Patient is located at home and provider is located at [facility].     The purpose of this virtual visit is to provide medical care while limiting exposure to the novel coronavirus. I discussed the limitations, risks, security and privacy concerns of performing an evaluation and management service by MyChart video and the availability of in person appointments. I also discussed with the patient that there may be a patient responsible charge related to this service. By engaging in this virtual visit, you consent to the provision of healthcare.  Additionally, you authorize for your insurance to be billed for the services provided during this visit.  The patient expressed understanding and agreed to proceed.  The following staff members participated in the virtual visit: Marylynn Pearson, RN    PRENATAL VISIT NOTE  Subjective:  Olivia Hayes is a 24 y.o. G2P0010 at [redacted]w[redacted]d  for phone visit for ongoing prenatal care.  She is currently monitored for the following issues for this high-risk pregnancy and has OVERWEIGHT; Multiple allergies; Acne; Globus sensation; Thyroid disorder screening; Strep tonsillitis; Vaginal discharge; Tinea corporis; Encounter for wellness examination; Vaccine for diphtheria-tetanus-pertussis, combined; ADHD (attention deficit hyperactivity disorder) evaluation; History of DVT (deep vein thrombosis); Supervision of high risk pregnancy, antepartum; Herpes simplex; Pelvic floor dysfunction; and GBS bacteriuria on their problem list.  Patient reports lots of headaches.  Contractions: Not present. Vag. Bleeding: None.  Movement: Present. Denies leaking of fluid.   The following portions of the patient's history were reviewed and updated as appropriate: allergies, current medications, past family history, past medical history, past social  history, past surgical history and problem list.   Objective:  There were no vitals filed for this visit. Self-Obtained    Fetal Status:     Movement: Present     Assessment and Plan:  Pregnancy: G2P0010 at [redacted]w[redacted]d 1. Supervision of high risk pregnancy, antepartum Not feeling much movement yet, but smiled when thinking about the movement she is able to feel.  [place] is scheduled on 12-17-19 Is moving and BP cuff is in her other location.  Plans to take BP later today and add into babyscripts.  Previous readings in Babyscripts are normal.  No readings for June.  Denies any edema.  2. GBS bacteriuria Will treat in labor  3. Pelvic floor dysfunction Did not discuss at today's visit but will need to evaluate to see if PT is needed.  4. Frequent headaches Drink at least 8 8-oz glasses of water every day. Advised protein every time you eat. Advised Citirizine daily for one week to see if it helps her headaches.  Currently takes PRN when having repetitive sneezing.  Preterm labor symptoms and general obstetric precautions including but not limited to vaginal bleeding, contractions, leaking of fluid and fetal movement were reviewed in detail with the patient.  Return in about 1 week (around 12/17/2019) for appointment for lab only AFP and 4 weeks for virtual ROB.  Future Appointments  Date Time Provider Department Center  12/17/2019  2:45 PM [facility]URSE [facility]  12/17/2019  2:45 PM [facility] US5 [facility]     Time spent on virtual visit: 10 minutes  Currie Paris, NP

## 2019-12-10 NOTE — Progress Notes (Signed)
I connected with  [place] on 12/10/19 at  1:35 PM EDT by mychart and verified that I am speaking with the correct person using two identifiers.   I discussed the limitations, risks, security and privacy concerns of performing an evaluation and management service by telephone and the availability of in person appointments. I also discussed with the patient that there may be a patient responsible charge related to this service. The patient expressed understanding and agreed to proceed.  Marylynn Pearson, RN 12/10/2019  1:45 PM

## 2019-12-10 NOTE — Progress Notes (Signed)
Patient reports bad headaches- "pounding sensation" for past 2 months almost every day, typically happens in morning. Denies blurry vision or dizziness with headaches. Tylenol helps minimally.

## 2019-12-11 NOTE — BH Specialist Note (Signed)
Integrated Behavioral Health via Telemedicine Video Visit  12/11/2019 Frankee Gritz [id number]  Number of Integrated Behavioral Health visits: 1 Session Start time: 3:13  Session End time: 3:50  Total time: 37  Referring Provider: Raynelle Dick, MD Type of Visit: Video Patient/Family location: [facility] Provider location: [facility]  All persons participating in visit: [name] and [facility] Caly Pellum    Confirmed patient's address: Yes  Confirmed patient's phone number: Yes  Any changes to demographics: No   Confirmed patient's insurance: Yes  Any changes to patient's insurance: No   Discussed confidentiality: Yes   I connected with [place]  by a video enabled telemedicine application ([employer]) and verified that I am speaking with the correct person using two identifiers.     I discussed the limitations of evaluation and management by telemedicine and the availability of in person appointments.  I discussed that the purpose of this visit is to provide behavioral health care while limiting exposure to the novel coronavirus.   Discussed there is a possibility of technology failure and discussed alternative modes of communication if that failure occurs.  I discussed that engaging in this video visit, they consent to the provision of behavioral healthcare and the services will be billed under their insurance.  Patient and/or legal guardian expressed understanding and consented to video visit: Yes   PRESENTING CONCERNS: Patient and/or family reports the following symptoms/concerns: Pt states her primary goal is to prevent postpartum depression; symptoms are depression, poor concentration, difficulty relaxing and irritability; has hx of "throwing up" when anxious, has coped with anxiety in the past using marijuana(none in pregnancy), and is open to learning healthy self-coping strategies to implement today. Pt  prefers no medication.  Duration of problem: Symptoms began over 5 years ago, with increase in current pregnancy; Severity of problem: moderate  STRENGTHS (Protective Factors/Coping Skills): Social support, open to learn and implement new coping strategies  GOALS ADDRESSED: Patient will: 1.  Reduce symptoms of: anxiety  2.  Increase knowledge and/or ability of: healthy habits and self-management skills  3.  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  PLAN: 1. Follow up with behavioral health clinician on : Two weeks. Call [place], as needed, at [phone], prior to scheduled visit 2. Behavioral recommendations:  -Continue taking prenatal vitamin daily, as prescribed by medical provider -CALM relaxation breathing exercise twice daily (morning; at bedtime with sleep sounds) for two weeks -Consider apps (on After Visit Summary) for additional self-care -Read Postpartum Planner (on After Visit Summary) to begin preparing for postpartum 3. Referral(s): Integrated [employer] (In Clinic)  I discussed the assessment and treatment plan with the patient and/or parent/guardian. They were provided an opportunity to ask questions and all were answered. They agreed with the plan and demonstrated an understanding of the instructions.   They were advised to call back or seek an in-person evaluation if the symptoms worsen or if the condition fails to improve as anticipated.  Valetta Close  [facility]  Depression screen [facility] 2/9 12/10/2019 11/12/2019 10/10/2019 09/27/2019 07/18/2018  Decreased Interest 1 1 1 1  0  Down, Depressed, Hopeless 2 0 0 1 0  PHQ - 2 Score 3 1 1 2  0  Altered sleeping 1 1 2  0 -  Tired, decreased energy 1 3 3 3  -  Change in appetite 1 3 2 1  -  Feeling bad or failure about yourself  0 0 0 0 -  Trouble concentrating 2 0 0 0 -  Moving slowly or fidgety/restless 0 1 0 0 -  Suicidal thoughts 0 0 0 0 -  PHQ-9 Score 8 9 8 6  -  Difficult doing work/chores - - Not difficult at all - -  ' GAD 7 : Generalized Anxiety Score 12/10/2019 11/12/2019 10/10/2019 09/27/2019  Nervous, Anxious, on Edge 1 1 2 1   Control/stop worrying 1 0 2 0  Worry too much - different things 1 1 2  0  Trouble relaxing 3 1 2  0  Restless 1 1 0 0  Easily annoyed or irritable 2 3 3 3   Afraid - awful might happen 1 1 1 1   Total GAD 7 Score 10 8 12 5   Anxiety Difficulty - - Not difficult at all -

## 2019-12-11 NOTE — Addendum Note (Signed)
Addended by: Hulda Marin C on: 12/11/2019 08:25 AM   Modules accepted: Orders

## 2019-12-17 ENCOUNTER — Encounter: Payer: Self-pay | Admitting: *Deleted

## 2019-12-17 ENCOUNTER — Other Ambulatory Visit: Payer: Self-pay

## 2019-12-17 ENCOUNTER — Ambulatory Visit: Payer: Managed Care, Other (non HMO) | Admitting: *Deleted

## 2019-12-17 ENCOUNTER — Other Ambulatory Visit: Payer: Managed Care, Other (non HMO)

## 2019-12-17 ENCOUNTER — Ambulatory Visit: Payer: Managed Care, Other (non HMO) | Attending: Obstetrics and Gynecology

## 2019-12-17 DIAGNOSIS — O0990 Supervision of high risk pregnancy, unspecified, unspecified trimester: Secondary | ICD-10-CM | POA: Insufficient documentation

## 2019-12-17 DIAGNOSIS — M6289 Other specified disorders of muscle: Secondary | ICD-10-CM | POA: Diagnosis present

## 2019-12-17 DIAGNOSIS — E669 Obesity, unspecified: Secondary | ICD-10-CM

## 2019-12-17 DIAGNOSIS — R8271 Bacteriuria: Secondary | ICD-10-CM | POA: Diagnosis present

## 2019-12-17 DIAGNOSIS — Z363 Encounter for antenatal screening for malformations: Secondary | ICD-10-CM | POA: Diagnosis not present

## 2019-12-17 DIAGNOSIS — Z86718 Personal history of other venous thrombosis and embolism: Secondary | ICD-10-CM | POA: Diagnosis present

## 2019-12-17 DIAGNOSIS — O2232 Deep phlebothrombosis in pregnancy, second trimester: Secondary | ICD-10-CM

## 2019-12-17 DIAGNOSIS — O99212 Obesity complicating pregnancy, second trimester: Secondary | ICD-10-CM | POA: Diagnosis not present

## 2019-12-17 DIAGNOSIS — Z3A21 21 weeks gestation of pregnancy: Secondary | ICD-10-CM

## 2019-12-18 ENCOUNTER — Other Ambulatory Visit: Payer: Self-pay | Admitting: *Deleted

## 2019-12-18 DIAGNOSIS — Z362 Encounter for other antenatal screening follow-up: Secondary | ICD-10-CM

## 2019-12-19 ENCOUNTER — Other Ambulatory Visit: Payer: Self-pay

## 2019-12-19 ENCOUNTER — Ambulatory Visit (INDEPENDENT_AMBULATORY_CARE_PROVIDER_SITE_OTHER): Payer: Managed Care, Other (non HMO) | Admitting: Clinical

## 2019-12-19 DIAGNOSIS — F4323 Adjustment disorder with mixed anxiety and depressed mood: Secondary | ICD-10-CM

## 2019-12-19 NOTE — Patient Instructions (Signed)
Center for [facility] [address] [place], [place] [address] [phone] (main office) [phone] (Renleigh Ouellet's office)    /Emotional Wellbeing Apps and Websites Here are a few free apps meant to help you to help yourself.  To find, try searching on the internet to see if the app is offered on Apple/Android devices. If your first choice doesn't come up on your device, the good news is that there are many choices! Play around with different apps to see which ones are helpful to you.    Calm This is an app meant to help increase calm feelings. Includes info, strategies, and tools for tracking your feelings.      Calm Harm  This app is meant to help with self-harm. Provides many 5-minute or 15-min coping strategies for doing instead of hurting yourself.       Healthy Minds Health Minds is a problem-solving tool to help deal with emotions and cope with stress you encounter wherever you are.      MindShift This app can help people cope with anxiety. Rather than trying to avoid anxiety, you can make an important shift and face it.      MY3  MY3 features a support system, safety plan and resources with the goal of offering a tool to use in a time of need.       My Life My Voice  This mood journal offers a simple solution for tracking your thoughts, feelings and moods. Animated emoticons can help identify your mood.       Relax Melodies Designed to help with sleep, on this app you can mix sounds and meditations for relaxation.      Smiling Mind Smiling Mind is meditation made easy: it's a simple tool that helps put a smile on your mind.        Stop, Breathe & Think  A friendly, simple guide for people through meditations for mindfulness and compassion.  Stop, Breathe and Think Kids Enter your current feelings and choose a "mission" to help you cope. Offers videos for certain moods instead of just sound recordings.        Team Orange The goal of this tool is to help teens change how they think, act, and react. This app helps you focus on your own good feelings and experiences.      The United Stationers Box The United Stationers Box (VHB) contains simple tools to help patients with coping, relaxation, distraction, and positive thinking.       BRAINSTORMING  Develop a Plan Goals: . Provide a way to start conversation about your new life with a baby . Assist parents in recognizing and using resources within their reach . Help pave the way before birth for an easier period of transition afterwards.  Make a list of the following information to keep in a central location: . Full name of Mom and Partner: _____________________________________________ . Baby's full name and Date of Birth: ___________________________________________ . Home Address: ___________________________________________________________ ________________________________________________________________________ . Home Phone: ____________________________________________________________ . Parents' cell numbers: _____________________________________________________ ________________________________________________________________________ . Name and contact info for OB: ______________________________________________ . Name and contact info for Pediatrician:________________________________________ . Contact info for Lactation Consultants: ________________________________________  REST and SLEEP *You each need at least 4-5 hours of uninterrupted sleep every day. Write specific names and contact information.* . How are you going to rest in the postpartum period? While partner's home? When partner returns to work? When you both return to work? Marland Kitchen Where will your baby  sleep? . Who is available to help during the day? Evening? Night? . Who could move in for a period to help support you? Marland Kitchen What are some ideas to help you get enough  sleep? __________________________________________________________________________________________________________________________________________________________________________________________________________________________________________ NUTRITIOUS FOOD AND DRINK *Plan for meals before your baby is born so you can have healthy food to eat during the immediate postpartum period.* . Who will look after breakfast? Lunch? Dinner? List names and contact information. Brainstorm quick, healthy ideas for each meal. . What can you do before baby is born to prepare meals for the postpartum period? . How can others help you with meals? Marland Kitchen Which grocery stores provide online shopping and delivery? Marland Kitchen Which restaurants offer take-out or delivery options? ______________________________________________________________________________________________________________________________________________________________________________________________________________________________________________________________________________________________________________________________________________________________________________________________________  CARE FOR MOM *It's important that mom is cared for and pampered in the postpartum period. Remember, the most important ways new mothers need care are: sleep, nutrition, gentle exercise, and time off.* . Who can come take care of mom during this period? Make a list of people with their contact information. . List some activities that make you feel cared for, rested, and energized? Who can make sure you have opportunities to do these things? . Does mom have a space of her very own within your home that's just for her? Make a "[facility]" where she can be comfortable, rest, and renew herself  daily. ______________________________________________________________________________________________________________________________________________________________________________________________________________________________________________________________________________________________________________________________________________________________________________________________________    CARE FOR AND FEEDING BABY *Knowledgeable and encouraging people will offer the best support with regard to feeding your baby.* . Educate yourself and choose the best feeding option for your baby. . Make a list of people who will guide, support, and be a resource for you as your care for and feed your baby. (Friends that have breastfed or are currently breastfeeding, lactation consultants, breastfeeding support groups, etc.) . Consider a postpartum doula. (These websites can give you information: dona.org & https://shea.org/) . Seek out local breastfeeding resources like the breastfeeding support group at [employer] or [employer]. ______________________________________________________________________________________________________________________________________________________________________________________________________________________________________________________________________________________________________________________________________________________________________________________________________  Judson Roch AND ERRANDS . Who can help with a thorough cleaning before baby is born? . Make a list of people who will help with housekeeping and chores, like laundry, light cleaning, dishes, bathrooms, etc. . Who can run some errands for you? Marland Kitchen What can you do to make sure you are stocked with basic supplies before baby is born? . Who is going to do the  shopping? ______________________________________________________________________________________________________________________________________________________________________________________________________________________________________________________________________________________________________________________________________________________________________________________________________     Family Adjustment *Nurture yourselves.it helps parents be more loving and allows for better bonding with their child.* . What sorts of things do you and partner enjoy doing together? Which activities help you to connect and strengthen your relationship? Make a list of those things. Make a list of people whom you trust to care for your baby so you can have some time together as a couple. . What types of things help partner feel connected to Mom? Make a list. . What needs will partner have in order to bond with baby? . Other children? Who will care for them when you go into labor and while you are in the hospital? . Think about what the needs of your older children might be. Who can help you meet those needs? In what ways are you helping them prepare for bringing baby home? List some specific strategies you have for family adjustment. _______________________________________________________________________________________________________________________________________________________________________________________________________________________________________________________________________________________________________________________________________________  SUPPORT *Someone who can empathize with experiences normalizes your problems and makes them more bearable.* . Make a list of other friends, neighbors, and/or co-workers you know with  infants (and small children, if applicable) with whom you can connect. . Make a list of local or online support groups, mom groups, etc. in which you can be  involved. ______________________________________________________________________________________________________________________________________________________________________________________________________________________________________________________________________________________________________________________________________________________________________________________________________  Childcare Plans . Investigate and plan for childcare if mom is returning to work. . Talk about mom's concerns about her transition back to work. . Talk about partner's concerns regarding this transition.  Mental Health *Your mental health is one of the highest priorities for a pregnant or postpartum mom.* . 1 in 5 women experience anxiety and/or depression from the time of conception through the first year after birth. . Postpartum Mood Disorders are the #1 complication of pregnancy and childbirth and the suffering experienced by these mothers is not necessary! These illnesses are temporary and respond well to treatment, which often includes self-care, social support, talk therapy, and medication when needed. . Women experiencing anxiety and depression often say things like: "I'm supposed to be happy.why do I feel so sad?", "Why can't I snap out of it?", "I'm having thoughts that scare me." . There is no need to be embarrassed if you are feeling these symptoms: o Overwhelmed, anxious, angry, sad, guilty, irritable, hopeless, exhausted but can't sleep o You are NOT alone. You are NOT to blame. With help, you WILL be well. . Where can I find help? Medical professionals such as your OB, midwife, gynecologist, family practitioner, primary care provider, pediatrician, or mental health providers; [facility] support groups: Feelings After Birth, Breastfeeding Support Group, Baby and Me Group, and Fit 4 Two exercise classes. . You have permission to ask for help. It will confirm your feelings, validate your  experiences, share/learn coping strategies, and gain support and encouragement as you heal. You are important! BRAINSTORM . Make a list of local resources, including resources for mom and for partner. . Identify support groups. . Identify people to call late at night - include names and contact info. . Talk with partner about perinatal mood and anxiety disorders. . Talk with your OB, midwife, and doula about baby blues and about perinatal mood and anxiety disorders. . Talk with your pediatrician about perinatal mood and anxiety disorders.   Support & Sanity Savers   What do you really need?  . Basics . In preparing for a new baby, many expectant parents spend hours shopping for baby clothes, decorating the nursery, and deciding which car seat to buy. Yet most don't think much about what the reality of parenting a newborn will be like, and what they need to make it through that. So, here is the advice of experienced parents. We know you'll read this, and think "they're exaggerating, I don't really need that." Just trust [place] on these, OK? Plan for all of this, and if it turns out you don't need it, come back and teach [place] how you did it!  Satira Anis (Once baby's survival needs are met, make sure you attend to your own survival needs!) . Sleep . An average newborn sleeps 16-18 hours per day, over 6-7 sleep periods, rarely more than three hours at a time. It is normal and healthy for a newborn to wake throughout the night... but really hard on parents!! . Naps. Prioritize sleep above any responsibilities like: cleaning house, visiting friends, running errands, etc.  Sleep whenever baby sleeps. If you can't nap, at least have restful times when baby eats. The more rest you get, the more patient you will be, the more emotionally stable, and better at solving problems.  Marland Kitchen  Food . You may not have realized it would be difficult to eat when you have a newborn. Yet, when we talk to . countless new  parents, they say things like "it may be 2:00 pm when I realize I haven't had breakfast yet." Or "every time we sit down to dinner, baby needs to eat, and my food gets cold, so I don't bother to eat it." . Finger food. Before your baby is born, stock up with one months' worth of food that: 1) you can eat with one hand while holding a baby, 2) doesn't need to be prepped, 3) is good hot or cold, 4) doesn't spoil when left out for a few hours, and 5) you like to eat. Think about: nuts, dried fruit, Clif bars, pretzels, jerky, gogurt, baby carrots, apples, bananas, crackers, cheez-n-crackers, string cheese, hot pockets or frozen burritos to microwave, garden burgers and breakfast pastries to put in the toaster, yogurt drinks, etc. . Restaurant Menus. Make lists of your favorite restaurants & menu items. When family/friends want to help, you can give specific information without much thought. They can either bring you the food or send gift cards for just the right meals. Rosaura Carpenter Meals.  Take some time to make a few meals to put in the freezer ahead of time.  Easy to freeze meals can be anything such as soup, lasagna, chicken pie, or spaghetti sauce. . Set up a Meal Schedule.  Ask friends and family to sign up to bring you meals during the first few weeks of being home. (It can be passed around at baby showers!) You have no idea how helpful this will be until you are in the throes of parenting.  https://hamilton-woodard.[email or web address] is a great website to check out. . Emotional Support . Know who to call when you're stressed out. Parenting a newborn is very challenging work. There are times when it totally overwhelms your normal coping abilities. EVERY NEW PARENT NEEDS TO HAVE A PLAN FOR WHO TO CALL WHEN THEY JUST CAN'T COPE ANY MORE. (And it has to be someone other than the baby's other parent!) Before your baby is born, come up with at least one person you can call for support - write their phone number down and post it on the  refrigerator. Marland Kitchen Anxiety & Sadness. Baby blues are normal after pregnancy; however, there are more severe types of anxiety & sadness which can occur and should not be ignored.  They are always treatable, but you have to take the first step by reaching out for help. [facility] offers a "Mom Talk" group which meets every Tuesday from 10 am - 11 am.  This group is for new moms who need support and connection after their babies are born.  Call [phone].  Marland Kitchen Really, Really Helpful (Plan for them! Make sure these happen often!!) . Physical Support with Taking Care of Yourselves . Asking friends and family. Before your baby is born, set up a schedule of people who can come and visit and help out (or ask a friend to schedule for you). Any time someone says "let me know what I can do to help," sign them up for a day. When they get there, their job is not to take care of the baby (that's your job and your joy). Their job is to take care of you!  . Postpartum doulas. If you don't have anyone you can call on for support, look into postpartum doulas:  professionals at helping parents with  caring for baby, caring for themselves, getting breastfeeding started, and helping with household tasks. www.padanc.org is a helpful website for learning about doulas in our area. . Peer Support / Parent Groups . Why: One of the greatest ideas for new parents is to be around other new parents. Parent groups give you a chance to share and listen to others who are going through the same season of life, get a sense of what is normal infant development by watching several babies learn and grow, share your stories of triumph and struggles with empathetic ears, and forgive your own mistakes when you realize all parents are learning by trial and error. . Where to find: There are many places you can meet other new parents throughout our community.  [facility] offers the following classes for new moms and their little ones:  Baby  and Me (Birth to Martin) and Breastfeeding Support Group. Go to www.conehealthybaby.com or call [phone] for more information. . Time for your Relationship . It's easy to get so caught up in meeting baby's immediate needs that it's hard to find time to connect with your partner, and meet the needs of your relationship. It's also easy to forget what "quality time with your partner" actually looks like. If you take your baby on a date, you'd be amazed how much of your couple time is spent feeding the baby, diapering the baby, admiring the baby, and talking about the baby. . Dating: Try to take time for just the two of you. Babysitter tip: Sometimes when moms are breastfeeding a newborn, they find it hard to figure out how to schedule outings around baby's unpredictable feeding schedules. Have the babysitter come for a three hour period. When she comes over, if baby has just eaten, you can leave right away, and come back in two hours. If baby hasn't fed recently, you start the date at home. Once baby gets hungry and gets a good feeding in, you can head out for the rest of your date time. . Date Nights at Home: If you can't get out, at least set aside one evening a week to prioritize your relationship: whenever baby dozes off or doesn't have any immediate needs, spend a little time focusing on each other. . Potential conflicts: The main relationship conflicts that come up for new parents are: issues related to sexuality, financial stresses, a feeling of an unfair division of household tasks, and conflicts in parenting styles. The more you can work on these issues before baby arrives, the better!  Clint Guy and Frills (Don't forget these. and don't feel guilty for indulging in them!) . Everyone has something in life that is a fun little treat that they do just for themselves. It may be: reading the morning paper, or going for a daily jog, or having coffee with a friend once a week, or going to a movie on Friday  nights, or fine chocolates, or bubble baths, or curling up with a good book. . Unless you do fun things for yourself every now and then, it's hard to have the energy for fun with your baby. Whatever your "special" treats are, make sure you find a way to continue to indulge in them after your baby is born. These special moments can recharge you, and allow you to return to baby with a new joy   PERINATAL MOOD DISORDERS: [place]   Emergency and Crisis Resources:  If you are an imminent risk to  self or others, are experiencing intense personal distress, and/or have noticed significant changes in activities of daily living, call:  . 911 . [facility]: [phone] . Mobile Crisis: [phone] . National Suicide Hotline: 8022687814 Or visit the following crisis centers: . Local Emergency Departments . [facility]: [address] [place]. Hours: 8:30AM-5PM. Insurance Accepted: Medicaid, Medicare, and Uninsured.  Marland Kitchen [facility]  [address], [place] Mon-Friday 8am-3pm  [phone]                                                                                    Non-Crisis Resources: To identify specific providers that are covered by your insurance, contact your insurance company or local agencies: [place]: 3403642428 [facility] and [place]: [phone] [place]: [phone] Postpartum Support International- [place] 1-(667)328-9132                                                      Outpatient therapy and medication management providers:  [facility] [phone] Hours: 9AM-5PM  Insurance Accepted: Alben Spittle, Lorella Nimrod, Freddrick March, [place], Medicare  [facility] ([place]) [phone] Hours: 8AM-5PM  nsurance Accepted: All insurances EXCEPT AARP, [place], [place], and  [place]: [phone]             Hours: 8AM-8PM Insurance Accepted: Cristal Ford, Freddrick March, [place], Medicare, [phone] [facility]: [phone] Hours: 8:30AM-7PM Insurance Accepted: Cristal Ford, Medicaid, Medicare, Tricare, [employer] Counseling:  [place] Accepted:  Holland Falling, Lorella Nimrod, [employer], [place], [facility] [phone] Hours: 9AM-5:30PM Insurance Accepted: Alben Spittle, Charlotte Crumb, and Medicaid, Medicare, [employer] [facility] Counseling:  [phone] Hours: 9am-5pm Insurance Accepted: BCBS; they do not accept Medicaid/Medicare [facility] [place]: [phone] Hours: 9am-9pm Insurance Accepted: All major insurance including Medicaid and Medicare [facility]: [phone] Hours: 9AM-5:30PM Insurance Accepted: All insurances EXCEPT Medicaid and Medicare. [facility]: [place]: [phone] [place]:  [place] (support for children in the NICU and/or with special needs), [phone]  Mental Health Support Groups Mental Health Association: [phone]                                                                                     Online Resources: Postpartum Support International: http://jones-berg.com/  997-741-4ELT 2Moms Supporting Moms:  www.momssupportingmoms.net

## 2019-12-25 NOTE — BH Specialist Note (Deleted)
Pt requests to reschedule, due to last-minute scheduling conflict, to 01/21/20@3 :45 for virtual [facility] visit.   Integrated Behavioral Health via Telemedicine Video (Caregility) Visit  12/25/2019 Lajoya Dombek [id number]   Rae Lips

## 2020-01-08 ENCOUNTER — Other Ambulatory Visit: Payer: Self-pay

## 2020-01-08 ENCOUNTER — Ambulatory Visit (INDEPENDENT_AMBULATORY_CARE_PROVIDER_SITE_OTHER): Payer: Managed Care, Other (non HMO) | Admitting: Nurse Practitioner

## 2020-01-08 ENCOUNTER — Encounter: Payer: Self-pay | Admitting: Nurse Practitioner

## 2020-01-08 VITALS — BP 110/59 | HR 73 | Wt 170.5 lb

## 2020-01-08 DIAGNOSIS — O0990 Supervision of high risk pregnancy, unspecified, unspecified trimester: Secondary | ICD-10-CM

## 2020-01-08 DIAGNOSIS — O99891 Other specified diseases and conditions complicating pregnancy: Secondary | ICD-10-CM

## 2020-01-08 DIAGNOSIS — O0992 Supervision of high risk pregnancy, unspecified, second trimester: Secondary | ICD-10-CM

## 2020-01-08 DIAGNOSIS — Z1331 Encounter for screening for depression: Secondary | ICD-10-CM

## 2020-01-08 DIAGNOSIS — Z86718 Personal history of other venous thrombosis and embolism: Secondary | ICD-10-CM

## 2020-01-08 DIAGNOSIS — M5430 Sciatica, unspecified side: Secondary | ICD-10-CM

## 2020-01-08 DIAGNOSIS — Z3A24 24 weeks gestation of pregnancy: Secondary | ICD-10-CM

## 2020-01-08 NOTE — Progress Notes (Signed)
° ° °  Subjective:  Olivia Hayes is a 24 y.o. G2P0010 at [redacted]w[redacted]d being seen today for ongoing prenatal care.  She is currently monitored for the following issues for this high-risk pregnancy and has OVERWEIGHT; Multiple allergies; Acne; Globus sensation; Thyroid disorder screening; Strep tonsillitis; Vaginal discharge; Tinea corporis; Encounter for wellness examination; Vaccine for diphtheria-tetanus-pertussis, combined; ADHD (attention deficit hyperactivity disorder) evaluation; History of DVT (deep vein thrombosis); Supervision of high risk pregnancy, antepartum; Herpes simplex; Pelvic floor dysfunction; and GBS bacteriuria on their problem list.  Patient reports sciatic nerve pain bilaterally.  Contractions: Not present. Vag. Bleeding: None.  Movement: Present. Denies leaking of fluid.   The following portions of the patient's history were reviewed and updated as appropriate: allergies, current medications, past family history, past medical history, past social history, past surgical history and problem list. Problem list updated.  Objective:   Vitals:   01/08/20 1405  BP: (!) 110/59  Pulse: 73  Weight: 170 lb 8 oz (77.3 kg)    Fetal Status: Fetal Heart Rate (bpm): 138 Fundal Height: 26 cm Movement: Present     General:  Alert, oriented and cooperative. Patient is in no acute distress.  Skin: Skin is warm and dry. No rash noted.   Cardiovascular: Normal heart rate noted  Respiratory: Normal respiratory effort, no problems with respiration noted  Abdomen: Soft, gravid, appropriate for gestational age. Pain/Pressure: Present     Pelvic:  Cervical exam deferred      Pubic symphysis pain noted on exam.  Extremities: Normal range of motion.  Edema: None  Mental Status: Normal mood and affect. Normal behavior. Normal judgment and thought content.   Urinalysis:      Assessment and Plan:  Pregnancy: G2P0010 at [redacted]w[redacted]d  1. Supervision of high risk pregnancy, antepartum Will refer to physical  therapy in the office Reviewed fasting appointment for lab visit at next ROB  2. History of DVT (deep vein thrombosis) MFM revised her Lovenox dose to once daily instead of twice daily.  Encouraged to continue this plan.  3. Positive depression screening Has seen behavioral health in the office and knows she can call for additional assistance.  Today her pelvic and back pain are more pressing issues for her.  Will reevaluate need for behavioral health at next visit  4. Sciatic nerve pain, unspecified laterality  - Ambulatory referral to Physical Therapy  Preterm labor symptoms and general obstetric precautions including but not limited to vaginal bleeding, contractions, leaking of fluid and fetal movement were reviewed in detail with the patient. Please refer to After Visit Summary for other counseling recommendations.  Return in about 3 weeks (around 01/29/2020) for early AM appointment for fasting glucola - please change currently scheduled ROB to 3 weeks.  Nolene Bernheim, RN, MSN, NP-BC Nurse Practitioner, [facility] for [employer], [facility] 01/08/2020 9:50 PM

## 2020-01-14 ENCOUNTER — Other Ambulatory Visit: Payer: Self-pay

## 2020-01-14 ENCOUNTER — Ambulatory Visit: Payer: Medicaid Other | Attending: Obstetrics

## 2020-01-14 ENCOUNTER — Ambulatory Visit: Payer: Medicaid Other | Admitting: *Deleted

## 2020-01-14 ENCOUNTER — Encounter: Payer: Self-pay | Admitting: *Deleted

## 2020-01-14 ENCOUNTER — Other Ambulatory Visit: Payer: Self-pay | Admitting: *Deleted

## 2020-01-14 DIAGNOSIS — Z363 Encounter for antenatal screening for malformations: Secondary | ICD-10-CM

## 2020-01-14 DIAGNOSIS — Z3A25 25 weeks gestation of pregnancy: Secondary | ICD-10-CM

## 2020-01-14 DIAGNOSIS — M6289 Other specified disorders of muscle: Secondary | ICD-10-CM | POA: Insufficient documentation

## 2020-01-14 DIAGNOSIS — Z86718 Personal history of other venous thrombosis and embolism: Secondary | ICD-10-CM

## 2020-01-14 DIAGNOSIS — O0990 Supervision of high risk pregnancy, unspecified, unspecified trimester: Secondary | ICD-10-CM | POA: Insufficient documentation

## 2020-01-14 DIAGNOSIS — Z362 Encounter for other antenatal screening follow-up: Secondary | ICD-10-CM | POA: Diagnosis not present

## 2020-01-14 DIAGNOSIS — R8271 Bacteriuria: Secondary | ICD-10-CM | POA: Diagnosis present

## 2020-01-14 DIAGNOSIS — O2232 Deep phlebothrombosis in pregnancy, second trimester: Secondary | ICD-10-CM

## 2020-01-14 NOTE — Progress Notes (Signed)
Patient states last night her husb accidentally kicked her abd while he was sleeping. States she felt a sharp pain on L side of abdomen. States she was able to sleep, but woke up with the same pain. Describes it as an intermittent dull ache. States she is feeling the baby move well. No bleeding or leaking of water.

## 2020-01-21 ENCOUNTER — Ambulatory Visit (INDEPENDENT_AMBULATORY_CARE_PROVIDER_SITE_OTHER): Payer: Managed Care, Other (non HMO) | Admitting: Clinical

## 2020-01-21 DIAGNOSIS — O99340 Other mental disorders complicating pregnancy, unspecified trimester: Secondary | ICD-10-CM

## 2020-01-21 DIAGNOSIS — F4323 Adjustment disorder with mixed anxiety and depressed mood: Secondary | ICD-10-CM

## 2020-01-21 NOTE — Patient Instructions (Addendum)
[facility] [place], [place] [address] [phone] (main office) [phone] ([place] office)     BRAINSTORMING  Develop a Plan Goals: . Provide a way to start conversation about your new life with a baby . Assist parents in recognizing and using resources within their reach . Help pave the way before birth for an easier period of transition afterwards.  Make a list of the following information to keep in a central location: . Full name of Mom and Partner: _____________________________________________ . 81 full name and Date of Birth: ___________________________________________ . Home Address: ___________________________________________________________ ________________________________________________________________________ . Home Phone: ____________________________________________________________ . Parents' cell numbers: _____________________________________________________ ________________________________________________________________________ . Name and contact info for OB: ______________________________________________ . Name and contact info for Pediatrician:________________________________________ . Contact info for Lactation Consultants: ________________________________________  REST and SLEEP *You each need at least 4-5 hours of uninterrupted sleep every day. Write specific names and contact information.* . How are you going to rest in the postpartum period? While partner's home? When partner returns to work? When you both return to work? Marland Kitchen Where will your baby sleep? Marland Kitchen Who is available to help during the day? Evening? Night? . Who could move in for a period to help support you? Marland Kitchen What are some ideas to help you get enough  sleep? __________________________________________________________________________________________________________________________________________________________________________________________________________________________________________ NUTRITIOUS FOOD AND DRINK *Plan for meals before your baby is born so you can have healthy food to eat during the immediate postpartum period.* . Who will look after breakfast? Lunch? Dinner? List names and contact information. Brainstorm quick, healthy ideas for each meal. . What can you do before baby is born to prepare meals for the postpartum period? . How can others help you with meals? Marland Kitchen Which grocery stores provide online shopping and delivery? Marland Kitchen Which restaurants offer take-out or delivery options? ______________________________________________________________________________________________________________________________________________________________________________________________________________________________________________________________________________________________________________________________________________________________________________________________________  CARE FOR MOM *It's important that mom is cared for and pampered in the postpartum period. Remember, the most important ways new mothers need care are: sleep, nutrition, gentle exercise, and time off.* . Who can come take care of mom during this period? Make a list of people with their contact information. . List some activities that make you feel cared for, rested, and energized? Who can make sure you have opportunities to do these things? . Does mom have a space of her very own within your home that's just for her? Make a "[facility]" where she can be comfortable, rest, and renew herself  daily. ______________________________________________________________________________________________________________________________________________________________________________________________________________________________________________________________________________________________________________________________________________________________________________________________________    CARE FOR AND FEEDING BABY *Knowledgeable and encouraging people will offer the best support with regard to feeding your baby.* . Educate yourself and choose the best feeding option for your baby. . Make a list of people who will guide, support, and be a resource for you as your care for and feed your baby. (Friends that have breastfed or are currently breastfeeding, lactation consultants, breastfeeding support groups, etc.) . Consider a postpartum doula. (These websites can give you information: dona.org & BuyingShow.es) . Seek out local breastfeeding resources like the breastfeeding support group at [employer] or [employer]. ______________________________________________________________________________________________________________________________________________________________________________________________________________________________________________________________________________________________________________________________________________________________________________________________________  Verner Chol AND ERRANDS . Who can help with a thorough cleaning before baby is born? . Make a list of people who will help with housekeeping and chores, like laundry, light cleaning, dishes, bathrooms, etc. . Who can run some errands for you? Marland Kitchen What can you do to make sure you are stocked with basic supplies before baby is born? . Who is going to do the  shopping? ______________________________________________________________________________________________________________________________________________________________________________________________________________________________________________________________________________________________________________________________________________________________________________________________________     Family Adjustment *Nurture yourselves.it helps parents be more loving and allows for better bonding with their child.* . What sorts of things do you and partner enjoy doing together? Which activities help you to connect and strengthen your relationship? Make a list of those things. Make a list of people whom you trust to care for your baby so you can have some time together as a couple. . What types of things help partner feel connected to Mom? Make a list. . What needs will partner have in order to bond with baby? . Other children? Who will care for them when you go into labor and while you are in the hospital? . Think about what the needs of your older children might be. Who can help you meet those needs? In what ways are you helping them prepare for bringing baby home? List some specific strategies you have for family adjustment. _______________________________________________________________________________________________________________________________________________________________________________________________________________________________________________________________________________________________________________________________________________  SUPPORT *Someone who can empathize with experiences normalizes your problems and makes them more bearable.* . Make a list of other friends, neighbors, and/or co-workers you know with infants (and small children, if applicable) with whom you can connect. . Make a list of local or online support groups, mom groups, etc. in which you can be  involved. ______________________________________________________________________________________________________________________________________________________________________________________________________________________________________________________________________________________________________________________________________________________________________________________________________  Childcare Plans . Investigate and plan for childcare if mom is returning to work. . Talk about mom's concerns about her transition back to work. . Talk about partner's concerns regarding this transition.  Mental Health *Your mental health is one of the highest priorities for a pregnant or postpartum mom.* . 1 in 5 women experience anxiety and/or depression from the time of conception through the first year after birth. . Postpartum Mood Disorders are the #1 complication of pregnancy and childbirth and the suffering experienced by these mothers is not necessary! These illnesses are temporary and respond well to treatment, which often includes self-care, social support, talk therapy, and medication when needed. . Women experiencing anxiety and depression often say things like: "I'm supposed to be happy.why do I feel so sad?", "Why can't I snap out of it?", "I'm having thoughts that scare me." . There is no need to be embarrassed if you are feeling these symptoms: o Overwhelmed, anxious, angry, sad, guilty, irritable, hopeless, exhausted but can't sleep o You are NOT alone. You are NOT to blame. With help, you WILL be well. . Where can I find help? Medical professionals such as your OB, midwife, gynecologist, family practitioner, primary care provider, pediatrician, or mental health providers; [facility] support groups: Feelings After Birth, Breastfeeding Support Group, Baby and Me Group, and Fit 4 Two exercise classes. . You have permission to ask for help. It will confirm your feelings, validate your  experiences, share/learn coping strategies, and gain support and encouragement as you heal. You are important! BRAINSTORM . Make a list of local resources, including resources for mom and for partner. . Identify support groups. . Identify people to call late at night - include names and contact info. . Talk with partner about perinatal mood and anxiety disorders. . Talk with your OB, midwife, and doula about baby blues and about perinatal mood and anxiety disorders. . Talk with your pediatrician about perinatal mood and anxiety disorders.   Support & Sanity Savers   What do you really need?  . Basics . In preparing for a new baby, many expectant parents spend hours shopping for baby clothes, decorating the nursery, and deciding which car seat to   buy. Yet most don't think much about what the reality of parenting a newborn will be like, and what they need to make it through that. So, here is the advice of experienced parents. We know you'll read this, and think "they're exaggerating, I don't really need that." Just trust [place] on these, OK? Plan for all of this, and if it turns out you don't need it, come back and teach [place] how you did it!  Satira Anis (Once baby's survival needs are met, make sure you attend to your own survival needs!) . Sleep . An average newborn sleeps 16-18 hours per day, over 6-7 sleep periods, rarely more than three hours at a time. It is normal and healthy for a newborn to wake throughout the night... but really hard on parents!! . Naps. Prioritize sleep above any responsibilities like: cleaning house, visiting friends, running errands, etc.  Sleep whenever baby sleeps. If you can't nap, at least have restful times when baby eats. The more rest you get, the more patient you will be, the more emotionally stable, and better at solving problems.  . Food . You may not have realized it would be difficult to eat when you have a newborn. Yet, when we talk to . countless new  parents, they say things like "it may be 2:00 pm when I realize I haven't had breakfast yet." Or "every time we sit down to dinner, baby needs to eat, and my food gets cold, so I don't bother to eat it." . Finger food. Before your baby is born, stock up with one months' worth of food that: 1) you can eat with one hand while holding a baby, 2) doesn't need to be prepped, 3) is good hot or cold, 4) doesn't spoil when left out for a few hours, and 5) you like to eat. Think about: nuts, dried fruit, Clif bars, pretzels, jerky, gogurt, baby carrots, apples, bananas, crackers, cheez-n-crackers, string cheese, hot pockets or frozen burritos to microwave, garden burgers and breakfast pastries to put in the toaster, yogurt drinks, etc. . Restaurant Menus. Make lists of your favorite restaurants & menu items. When family/friends want to help, you can give specific information without much thought. They can either bring you the food or send gift cards for just the right meals. Rosaura Carpenter Meals.  Take some time to make a few meals to put in the freezer ahead of time.  Easy to freeze meals can be anything such as soup, lasagna, chicken pie, or spaghetti sauce. . Set up a Meal Schedule.  Ask friends and family to sign up to bring you meals during the first few weeks of being home. (It can be passed around at baby showers!) You have no idea how helpful this will be until you are in the throes of parenting.  https://hamilton-woodard.com/ is a great website to check out. . Emotional Support . Know who to call when you're stressed out. Parenting a newborn is very challenging work. There are times when it totally overwhelms your normal coping abilities. EVERY NEW PARENT NEEDS TO HAVE A PLAN FOR WHO TO CALL WHEN THEY JUST CAN'T COPE ANY MORE. (And it has to be someone other than the baby's other parent!) Before your baby is born, come up with at least one person you can call for support - write their phone number down and post it on the  refrigerator. Marland Kitchen Anxiety & Sadness. Baby blues are normal after pregnancy; however, there are more severe types of anxiety &  sadness which can occur and should not be ignored.  They are always treatable, but you have to take the first step by reaching out for help. [facility] offers a "Mom Talk" group which meets every Tuesday from 10 am - 11 am.  This group is for new moms who need support and connection after their babies are born.  Call [phone].  Marland Kitchen Really, Really Helpful (Plan for them! Make sure these happen often!!) . Physical Support with Taking Care of Yourselves . Asking friends and family. Before your baby is born, set up a schedule of people who can come and visit and help out (or ask a friend to schedule for you). Any time someone says "let me know what I can do to help," sign them up for a day. When they get there, their job is not to take care of the baby (that's your job and your joy). Their job is to take care of you!  . Postpartum doulas. If you don't have anyone you can call on for support, look into postpartum doulas:  professionals at helping parents with caring for baby, caring for themselves, getting breastfeeding started, and helping with household tasks. www.padanc.org is a helpful website for learning about doulas in our area. . Peer Support / Parent Groups . Why: One of the greatest ideas for new parents is to be around other new parents. Parent groups give you a chance to share and listen to others who are going through the same season of life, get a sense of what is normal infant development by watching several babies learn and grow, share your stories of triumph and struggles with empathetic ears, and forgive your own mistakes when you realize all parents are learning by trial and error. . Where to find: There are many places you can meet other new parents throughout our community.  [facility] offers the following classes for new moms and their little ones:  Baby  and Me (Birth to [place]) and Breastfeeding Support Group. Go to www.conehealthybaby.com or call [phone] for more information. . Time for your Relationship . It's easy to get so caught up in meeting baby's immediate needs that it's hard to find time to connect with your partner, and meet the needs of your relationship. It's also easy to forget what "quality time with your partner" actually looks like. If you take your baby on a date, you'd be amazed how much of your couple time is spent feeding the baby, diapering the baby, admiring the baby, and talking about the baby. . Dating: Try to take time for just the two of you. Babysitter tip: Sometimes when moms are breastfeeding a newborn, they find it hard to figure out how to schedule outings around baby's unpredictable feeding schedules. Have the babysitter come for a three hour period. When she comes over, if baby has just eaten, you can leave right away, and come back in two hours. If baby hasn't fed recently, you start the date at home. Once baby gets hungry and gets a good feeding in, you can head out for the rest of your date time. . Date Nights at Home: If you can't get out, at least set aside one evening a week to prioritize your relationship: whenever baby dozes off or doesn't have any immediate needs, spend a little time focusing on each other. . Potential conflicts: The main relationship conflicts that come up for new parents are: issues related to sexuality, financial stresses, a feeling of an unfair division  of household tasks, and conflicts in parenting styles. The more you can work on these issues before baby arrives, the better!  . Fun and Frills (Don't forget these. and don't feel guilty for indulging in them!) . Everyone has something in life that is a fun little treat that they do just for themselves. It may be: reading the morning paper, or going for a daily jog, or having coffee with a friend once a week, or going to a movie on Friday  nights, or fine chocolates, or bubble baths, or curling up with a good book. . Unless you do fun things for yourself every now and then, it's hard to have the energy for fun with your baby. Whatever your "special" treats are, make sure you find a way to continue to indulge in them after your baby is born. These special moments can recharge you, and allow you to return to baby with a new joy   PERINATAL MOOD DISORDERS: MATERNAL MENTAL HEALTH FROM CONCEPTION THROUGH THE POSTPARTUM PERIOD   Emergency and Crisis Resources:  If you are an imminent risk to self or others, are experiencing intense personal distress, and/or have noticed significant changes in activities of daily living, call:  . 911 . [facility]: [phone] . Mobile Crisis: 877-626-1772 . National Suicide Hotline: 1-800-273-8255 Or visit the following crisis centers: . Local Emergency Departments . [facility]: [address], [place] [phone]. Hours: 8:30AM-5PM. Insurance Accepted: Medicaid, Medicare, and Uninsured.  . [facility]  [address], [place] Mon-Friday 8am-3pm  [phone]                                                                                    Non-Crisis Resources: To identify specific providers that are covered by your insurance, contact your insurance company or local agencies: [facility]: 1-800-256-2452 [facility]: 888-581-9988 [employer]: 1-800-939-5911 Postpartum Support International- [place] 1-800-944-4773                                                      Outpatient therapy and medication management providers:  [facility] [phone] Hours: 9AM-5PM  Insurance Accepted: AARP, Aetna, BCBS, Cigna, [employer], Humana, Medicare  [facility] [phone] Hours: 8AM-5PM  nsurance Accepted: All insurances EXCEPT AARP, Aetna, [employer], and  [employer] [facility]: [phone]             Hours: 8AM-8PM Insurance Accepted: Aetna, BCBS, Cigna, [employer], Medicaid, Medicare, Uninsured [facility]: [phone] [facility]: [phone] Hours: 8:30AM-7PM Insurance Accepted: Aetna, BCBS, Medicaid, Medicare, Tricare, [facility]:  [phone]              Hours:9AM-5PM Insurance Accepted:  Aetna, BCBS, [facility], Medicaid, United Health Care  [facility] [phone] Hours: 9AM-5:30PM Insurance Accepted: AARP, Aetna, BCBS, Cigna, and Medicaid, Medicare, United Health Care [facility]:  [phone] Hours: 9am-5pm Insurance Accepted: BCBS; they do not accept Medicaid/Medicare   [facility]: [phone] Hours: 9am-9pm Insurance Accepted: All major insurance including Medicaid and Medicare [facility]: [phone] Hours: 9AM-5:30PM Insurance Accepted: All insurances EXCEPT Medicaid and Medicare. [facility]: [phone]                                                                       Parenting Support Groups [facility]: [phone] [facility]:  [phone] Family Support Network (support for children in the NICU and/or with special needs), [phone]                                                                   Mental Health Support Groups Mental Health Association: 336-373-1402                                                                                     Online Resources: Postpartum Support International: http://www.postpartum.net/  800-944-4PPD 2Moms Supporting Moms:  www.momssupportingmoms.net     

## 2020-01-21 NOTE — BH Specialist Note (Signed)
Integrated Behavioral Health via Telemedicine Phone (Caregility) Visit  01/21/2020 [name] [id number]  Number of Integrated Behavioral Health visits: 2 Session Start time: 3:48  Session End time: 4:01 Total time: 13  Referring Provider: Raynelle Dick, [facility] Type of Visit: Phone Patient/Family location: [place], [place]  [facility] Provider location: [facility] [employer] [facility]  All persons participating in visit: [name] and [facility] Olivia Hayes    Confirmed patient's address: Yes  Confirmed patient's phone number: Yes  Any changes to demographics: No   Confirmed patient's insurance: Yes  Any changes to patient's insurance: No   Discussed confidentiality: at previous visit  I connected with Olivia Hayes  by a video enabled telemedicine application (Caregility) and verified that I am speaking with the correct person using two identifiers.     I discussed the limitations of evaluation and management by telemedicine and the availability of in person appointments.  I discussed that the purpose of this visit is to provide behavioral health care while limiting exposure to the novel coronavirus.   Discussed there is a possibility of technology failure and discussed alternative modes of communication if that failure occurs.  I discussed that engaging in this virtual visit, they consent to the provision of behavioral healthcare and the services will be billed under their insurance.  Patient and/or legal guardian expressed understanding and consented to virtual visit: Yes   PRESENTING CONCERNS: Patient and/or family reports the following symptoms/concerns: Pt states her primary concern today is mild anxiety with "many questions" about upcoming childbirth itself; pt states she is feeling much better after recent move into new home, and has no other concerns at this time.  Duration of problem: Current pregnancy; Severity of problem:  moderate  STRENGTHS (Protective Factors/Coping Skills): Good social support; implementing self-coping strategy daily as needed  GOALS ADDRESSED: Patient will: 1.  Reduce symptoms of: anxiety and stress  2.  Increase knowledge and/or ability of: stress reduction  3.  Demonstrate ability to: Increase healthy adjustment to current life circumstances and Increase adequate support systems for patient/family  INTERVENTIONS: Interventions utilized:  Solution-Focused Strategies Standardized Assessments completed: Not needed today  ASSESSMENT: Patient currently experiencing Adjustment disorder with mixed anxiety and depression.   Patient may benefit from continued psychoeducation and brief therapeutic interventions regarding coping with symptoms of anxiety and depression .  PLAN: 1. Follow up with behavioral health clinician on : Call Olivia Hayes at [phone] or main office at [phone] to set up appointment, if symptoms remain or increase 2. Behavioral recommendations:  -Continue taking prenatal vitamin daily -Read Postpartum Planner (on After Visit Summary)  -Go to [email or web address] to register for childbirth education class of choice and to view virtual tour of women's hospital -Continue using relaxation breathing exercise daily, as needed  3. Referral(s): Integrated Art gallery manager (In Clinic) and [employer]:  On PP planner; [email or web address]   I discussed the assessment and treatment plan with the patient and/or parent/guardian. They were provided an opportunity to ask questions and all were answered. They agreed with the plan and demonstrated an understanding of the instructions.   They were advised to call back or seek an in-person evaluation if the symptoms worsen or if the condition fails to improve as anticipated.  Valetta Close [facility]  Depression screen [facility] 2/9 01/08/2020 12/10/2019 11/12/2019 10/10/2019 09/27/2019  Decreased Interest 1 1 1 1 1   Down,  Depressed, Hopeless 1 2 0 0 1  PHQ - 2 Score 2 3 1 1  2  Altered sleeping 2 1 1 2  0  Tired, decreased energy 2 1 3 3 3   Change in appetite 2 1 3 2 1   Feeling bad or failure about yourself  0 0 0 0 0  Trouble concentrating 1 2 0 0 0  Moving slowly or fidgety/restless 1 0 1 0 0  Suicidal thoughts 0 0 0 0 0  PHQ-9 Score 10 8 9 8 6   Difficult doing work/chores - - - Not difficult at all -   GAD 7 : Generalized Anxiety Score 01/08/2020 12/10/2019 11/12/2019 10/10/2019  Nervous, Anxious, on Edge 1 1 1 2   Control/stop worrying 1 1 0 2  Worry too much - different things 2 1 1 2   Trouble relaxing 2 3 1 2   Restless 1 1 1  0  Easily annoyed or irritable 3 2 3 3   Afraid - awful might happen 1 1 1 1   Total GAD 7 Score 11 10 8 12   Anxiety Difficulty - - - Not difficult at all

## 2020-01-29 ENCOUNTER — Other Ambulatory Visit: Payer: Managed Care, Other (non HMO)

## 2020-01-29 ENCOUNTER — Other Ambulatory Visit: Payer: Self-pay

## 2020-01-29 ENCOUNTER — Encounter: Payer: Self-pay | Admitting: Student

## 2020-01-29 ENCOUNTER — Other Ambulatory Visit: Payer: Self-pay | Admitting: General Practice

## 2020-01-29 ENCOUNTER — Ambulatory Visit (INDEPENDENT_AMBULATORY_CARE_PROVIDER_SITE_OTHER): Payer: Managed Care, Other (non HMO) | Admitting: Student

## 2020-01-29 VITALS — BP 100/66 | HR 103 | Wt 175.2 lb

## 2020-01-29 DIAGNOSIS — Z86718 Personal history of other venous thrombosis and embolism: Secondary | ICD-10-CM

## 2020-01-29 DIAGNOSIS — Z3A27 27 weeks gestation of pregnancy: Secondary | ICD-10-CM

## 2020-01-29 DIAGNOSIS — B009 Herpesviral infection, unspecified: Secondary | ICD-10-CM

## 2020-01-29 DIAGNOSIS — O98512 Other viral diseases complicating pregnancy, second trimester: Secondary | ICD-10-CM

## 2020-01-29 DIAGNOSIS — Z3403 Encounter for supervision of normal first pregnancy, third trimester: Secondary | ICD-10-CM | POA: Insufficient documentation

## 2020-01-29 DIAGNOSIS — Z23 Encounter for immunization: Secondary | ICD-10-CM | POA: Diagnosis not present

## 2020-01-29 DIAGNOSIS — A6000 Herpesviral infection of urogenital system, unspecified: Secondary | ICD-10-CM

## 2020-01-29 DIAGNOSIS — Z131 Encounter for screening for diabetes mellitus: Secondary | ICD-10-CM | POA: Insufficient documentation

## 2020-01-29 DIAGNOSIS — O0990 Supervision of high risk pregnancy, unspecified, unspecified trimester: Secondary | ICD-10-CM

## 2020-01-29 DIAGNOSIS — Z7901 Long term (current) use of anticoagulants: Secondary | ICD-10-CM

## 2020-01-29 MED ORDER — VALACYCLOVIR HCL 1 G PO TABS: 1000.0000 mg | ORAL_TABLET | Freq: Every day | ORAL | 1 refills | Status: DC

## 2020-01-29 NOTE — Progress Notes (Addendum)
PRENATAL VISIT NOTE  Subjective:  Olivia Hayes is a 24 y.o. G2P0010 at [redacted]w[redacted]d being seen today for ongoing prenatal care.  She is currently monitored for the following issues for this low-risk pregnancy and has OVERWEIGHT; Multiple allergies; Acne; Globus sensation; Thyroid disorder screening; Strep tonsillitis; Vaginal discharge; Tinea corporis; Encounter for wellness examination; Vaccine for diphtheria-tetanus-pertussis, combined; ADHD (attention deficit hyperactivity disorder) evaluation; History of DVT (deep vein thrombosis); Supervision of high risk pregnancy, antepartum; Herpes simplex; Pelvic floor dysfunction; GBS bacteriuria; Supervision of low-risk first pregnancy, third trimester; and Encounter for screening examination for impaired glucose regulation and diabetes mellitus on their problem list.  Patient reports no complaints.  Contractions: Not present. Vag. Bleeding: None.  Movement: Present. Denies leaking of fluid.   The following portions of the patient's history were reviewed and updated as appropriate: allergies, current medications, past family history, past medical history, past social history, past surgical history and problem list. Problem list updated.  Objective:   Vitals:   01/29/20 0856  BP: 100/66  Pulse: 103  Weight: 175 lb 3.2 oz (79.5 kg)    Fetal Status: Fetal Heart Rate (bpm): 141 Fundal Height: 29 cm Movement: Present     General:  Alert, oriented and cooperative. Patient is in no acute distress.  Skin: Skin is warm and dry. No rash noted.   Cardiovascular: Normal heart rate noted  Respiratory: Normal respiratory effort, no problems with respiration noted  Abdomen: Soft, gravid, appropriate for gestational age.  Pain/Pressure: Present     Pelvic: Cervical exam deferred        Extremities: Normal range of motion.  Edema: None  Mental Status:  Normal mood and affect. Normal behavior. Normal judgment and thought content.   Assessment and Plan:    Pregnancy: G2P0010 at [redacted]w[redacted]d  1. Supervision of low-risk first pregnancy, third trimester No spotting, cramping, or fluid leakage at this time. Fundal height measuring 30cm. Blood pressure well controlled. Discussed warning signs of pre-eclampsia, elevated BP, and warning signs that would warrant going to the hospital for evaluation. Current DVT prophylaxis of daily enoxaparin injections given history of DVT. Questions answered about anticoagulant therapy, contraceptive options after delivery, and pain control measures during delivery.  PLAN: - Monitor BP weekly - Notify provider for SBP >140 or DBP >90 - if sustained or accompanied with other symptoms, please go to the hospital for evaluation.  - Follow-up via virtual visit in 3 weeks   2. History of DVT (deep vein thrombosis) No signs of bleeding or new DVT on current medication dosage. Patient appears to be tolerating well. Currently on daily enoxaparin (Lovenox) 80mg  injections. Continue daily injections at the current dosage until [redacted] weeks gestation. Check platelets at 36 weeks to ensure adequate anticoagulation. Follow recommendations on decreasing enoxaparin dose after 36 weeks based on results.  PLAN: - Continue enoxaparin (Lovenox) 80mg  injection once a day - Check platelets at 36 weeks - Consider decreasing enoxaparin dosage after 36 weeks based on platelets and recommendations from MD.    3. Genital herpes simplex type 1 infection No current signs or symptoms of outbreak today. Discussed the importance of prophylactic treatment at the time of delivery to reduce the risk of passing the virus to the baby. Discussed the importance of monitoring for current outbreak and starting treatment as soon as outbreak is recognized.  PLAN: - Valtrex for prophylaxis starting at [redacted] weeks gestation - Notify provider and start treatment sooner if outbreak occurs.  - Script sent to pharmacy  4.  Encounter for screening examination for impaired  glucose regulation and diabetes mellitus 2 hour OGTT in process. Will follow for results and make changes to the plan of care based on results.     There are no diagnoses linked to this encounter. Term labor symptoms and general obstetric precautions including but not limited to vaginal bleeding, contractions, leaking of fluid and fetal movement were reviewed in detail with the patient. Please refer to After Visit Summary for other counseling recommendations.  Return in about 3 weeks (around 02/19/2020), or MY chart with OB to talk about lovenox dosing, for 30 week f/u.   Tollie Eth, NP

## 2020-01-29 NOTE — Patient Instructions (Addendum)
Pregnancy and Genital Herpes  Genital herpes is an STI (sexually transmitted infection) that is caused by the herpes simplex virus (HSV). HSV can cause an outbreak of itching, blisters, and sores (ulcers) around the genitals and rectum. Even when the outbreak goes away, the virus stays in the body. If you are pregnant, you can pass HSV to your baby. If you become infected with HSV for the first time while you are pregnant, the virus can cause serious problems for your baby. If you had HSV before your pregnancy, the virus may not affect your baby as seriously. Babies that are infected with HSV are at risk for developing inflammation of the brain (encephalitis), damage to organs, and problems with development. How does this affect me? Your type of delivery may be affected. You may be able to have a vaginal delivery if you have no evidence of an outbreak when you go into labor. However, your baby may need to be delivered by C-section (cesarean delivery) if you have:  An active, recurrent, or new herpes outbreak at the time of delivery. This is because the virus can pass to your baby through an infected birth canal. This can cause severe problems for your baby.  Any symptoms of infection in the areas around the genitals such as pain, burning, and itching, even if you do not have any ulcers in the birth canal. After delivery, you can breastfeed your baby. The virus will not be present in breast milk. While caring for your baby, you will need to take steps to avoid passing the virus on to your baby. How does this affect my baby? If the virus passes to your baby, it can cause serious problems. The virus can be passed to your baby:  Before delivery. The virus can be passed to your unborn baby through the placenta. This is more likely to happen if you get herpes for the first time in the first 3 months of pregnancy (first trimester). This may cause your baby to have a congenital disability.  During delivery.  This is more likely to happen if you become infected for the first time late in your pregnancy.  After delivery. Your baby can get a herpes infection if you touch active ulcers and then touch your baby without washing your hands. The virus is less likely to pass to your baby if you had herpes before you became pregnant. This is because antibodies against the virus develop over a period of time. These antibodies help to protect the baby. How is this treated? This condition can be treated with medicines during pregnancy that are safe for you and your baby. These medicines can help to reduce symptoms, shorten an outbreak, and prevent another outbreak of the infection. If the infection happened before you became pregnant, you may need to take medicine late in your pregnancy to help to prevent a breakout at the time of delivery. Follow these instructions at home: To avoid passing the virus to your baby:  Wash your hands with soap and water often and before touching your baby.  If you have an outbreak, keep the area clean and covered.  If ulcers are present on your breast, do not breastfeed from the affected breast. Contact a health care provider if:  You have a rash, blisters, or ulcers in the area around your genitals or rectum.  You have burning, itching, or pain in the area around your genitals or rectum.  You have trouble urinating. Summary  Genital herpes is an  STI (sexually transmitted infection) that is caused by the herpes simplex virus (HSV). If you are pregnant, you can pass the virus to your baby.  Even when the outbreak goes away, the virus stays in your body.  Genital herpes can be passed to your unborn or newborn baby and cause serious problems.  This condition can be treated with medicines during pregnancy that are safe for you and your baby. Medicines can treat your symptoms, shorten the length of an outbreak, and prevent another outbreak of the infection.  If you have signs  or symptoms of a herpes outbreak when you go into labor, your health care provider may recommend a C-section (cesarean delivery) to lower the risk of passing the virus to your baby. This information is not intended to replace advice given to you by your health care provider. Make sure you discuss any questions you have with your health care provider. Document Revised: 10/13/2018 Document Reviewed: 09/21/2018 Elsevier Patient Education  2020 [employer].   Preventing Preterm Birth Preterm birth is when your baby is delivered between 20 weeks and 37 weeks of pregnancy. A full-term pregnancy lasts for at least 37 weeks. Preterm birth can be dangerous for your baby because the last few weeks of pregnancy are an important time for your baby's brain and lungs to grow. Many things can cause a baby to be born Olivia Hayes. Sometimes the cause is not known. There are certain factors that make you more likely to experience preterm birth, such as:  Having a previous baby born preterm.  Being pregnant with twins or other multiples.  Having had fertility treatment.  Being overweight or underweight at the start of your pregnancy.  Having any of the following during pregnancy: ? An infection, including a urinary tract infection (UTI) or an STI (sexually transmitted infection). ? High blood pressure. ? Diabetes. ? Vaginal bleeding.  Being age 84 or older.  Being age 21 or younger.  Getting pregnant within 6 months of a previous pregnancy.  Suffering extreme stress or physical or emotional abuse during pregnancy.  Standing for long periods of time during pregnancy, such as working at a job that requires standing. What are the risks? The most serious risk of preterm birth is that the baby may not survive. This is more likely to happen if a baby is born before 34 weeks. Other risks and complications of preterm birth may include your baby having:  Breathing problems.  Brain damage that affects movement  and coordination (cerebral palsy).  Feeding difficulties.  Vision or hearing problems.  Infections or inflammation of the digestive tract (colitis).  Developmental delays.  Learning disabilities.  Higher risk for diabetes, heart disease, and high blood pressure later in life. What can I do to lower my risk?  Medical care The most important thing you can do to lower your risk for preterm birth is to get routine medical care during pregnancy (prenatal care). If you have a high risk of preterm birth, you may be referred to a health care provider who specializes in managing high-risk pregnancies (perinatologist). You may be given medicine to help prevent preterm birth. Lifestyle changes Certain lifestyle changes can also lower your risk of preterm birth:  Wait at least 6 months after a pregnancy to become pregnant again.  Try to plan pregnancy for when you are between 28 and 28 years old.  Get to a healthy weight before getting pregnant. If you are overweight, work with your health care provider to safely lose  weight.  Do not use any products that contain nicotine or tobacco, such as cigarettes and e-cigarettes. If you need help quitting, ask your health care provider.  Do not drink alcohol.  Do not use drugs. Where to find support For more support, consider:  Talking with your health care provider.  Talking with a therapist or substance abuse counselor, if you need help quitting.  Working with a diet and nutrition specialist (dietitian) or a Systems analyst to maintain a healthy weight.  Joining a support group. Where to find more information Learn more about preventing preterm birth from:  Centers for Disease Control and Prevention: http://curry.org/  March of Dimes: marchofdimes.org/complications/premature-babies.aspx  American Pregnancy Association: americanpregnancy.org/labor-and-birth/premature-labor Contact a health  care provider if:  You have any of the following signs of preterm labor before 37 weeks: ? A change or increase in vaginal discharge. ? Fluid leaking from your vagina. ? Pressure or cramps in your lower abdomen. ? A backache that does not go away or gets worse. ? Regular tightening (contractions) in your lower abdomen. Summary  Preterm birth means having your baby during weeks 20-37 of pregnancy.  Preterm birth may put your baby at risk for physical and mental problems.  Getting good prenatal care can help prevent preterm birth.  You can lower your risk of preterm birth by making certain lifestyle changes, such as not smoking and not using alcohol. This information is not intended to replace advice given to you by your health care provider. Make sure you discuss any questions you have with your health care provider. Document Revised: 06/03/2017 Document Reviewed: 02/28/2016 Elsevier Patient Education  2020 Elsevier Inc.   Pain Relief During Labor and Delivery Many things can cause pain during labor and delivery, including:  Pressure on bones and ligaments due to the baby moving through the pelvis.  Stretching of tissues due to the baby moving through the birth canal.  Muscle tension due to anxiety or nervousness.  The uterus tightening (contracting) and relaxing to help move the baby. There are many ways to deal with the pain of labor and delivery. They include:  Taking prenatal classes. Taking these classes helps you know what to expect during your baby's birth. What you learn will increase your confidence and decrease your anxiety.  Practicing relaxation techniques or doing relaxing activities, such as: ? Focused breathing. ? Meditation. ? Visualization. ? Aroma therapy. ? Listening to your favorite music. ? Hypnosis.  Taking a warm shower or bath (hydrotherapy). This may: ? Provide comfort and relaxation. ? Lessen your perception of pain. ? Decrease the amount of  pain medicine needed. ? Decrease the length of labor.  Getting a massage or counterpressure on your back.  Applying warm packs or ice packs.  Changing positions often, moving around, or using a birthing ball.  Getting: ? Pain medicine through an IV or injection into a muscle. ? Pain medicine inserted into your spinal column. ? Injections of sterile water just under the skin on your lower back (intradermal injections). ? Laughing gas (nitrous oxide). Discuss your pain control options with your health care provider during your prenatal visits. Explore the options offered by your hospital or birth center. What kinds of medicine are available? There are two kinds of medicines that can be used to relieve pain during labor and delivery:  Analgesics. These medicines decrease pain without causing you to lose feeling or the ability to move your muscles.  Anesthetics. These medicines block feeling in the body and can decrease  your ability to move freely. Both of these kinds of medicine can cause minor side effects, such as nausea, trouble concentrating, and sleepiness. They can also decrease the baby's heart rate before birth and affect the baby's breathing rate after birth. For this reason, health care providers are careful about when and how much medicine is given. What are specific medicines and procedures that provide pain relief? Local Anesthetics Local anesthetics are used to numb a small area of the body. They may be used along with another kind of anesthetic or used to numb the nerves of the vagina, cervix, and perineum during the second stage of labor. General Anesthetics General anesthetics cause you to lose consciousness so you do not feel pain. They are usually only used for an emergency cesarean delivery. General anesthetics are given through an IV tube and a mask. Pudendal Block A pudendal block is a form of local anesthetic. It may be used to relieve the pain associated with pushing  or stretching of the perineum at the time of delivery or to further numb the perineum. A pudendal block is done by injecting numbing medicine through the vaginal wall into a nerve in the pelvis. Epidural Analgesia Epidural analgesia is given through a flexible IV catheter that is inserted into the lower back. Numbing medicine is delivered continuously to the area near your spinal column nerves (epidural space). After having this type of analgesia, you may be able to move your legs but you most likely will not be able to walk. Depending on the amount of medicine given, you may lose all feeling in the lower half of your body, or you may retain some level of sensation, including the urge to push. Epidural analgesia can be used to provide pain relief for a vaginal birth. Spinal Block A spinal block is similar to epidural analgesia, but the medicine is injected into the spinal fluid instead of the epidural space. A spinal block is only given once. It starts to relieve pain quickly, but the pain relief lasts only 1-6 hours. Spinal blocks can be used for cesarean deliveries. Combined Spinal-Epidural (CSE) Block A CSE block combines the effects of a spinal block and epidural analgesia. The spinal block works quickly to block all pain. The epidural analgesia provides continuous pain relief, even after the effects of the spinal block have worn off. This information is not intended to replace advice given to you by your health care provider. Make sure you discuss any questions you have with your health care provider. Document Revised: 06/03/2017 Document Reviewed: 11/12/2015 Elsevier Patient Education  2020 [employer].

## 2020-01-30 LAB — HIV ANTIBODY (ROUTINE TESTING W REFLEX): HIV Screen 4th Generation wRfx: NONREACTIVE

## 2020-01-30 LAB — CBC
Hematocrit: 38.5 % (ref 34.0–46.6)
Hemoglobin: 13 g/dL (ref 11.1–15.9)
MCH: 30.2 pg (ref 26.6–33.0)
MCHC: 33.8 g/dL (ref 31.5–35.7)
MCV: 90 fL (ref 79–97)
Platelets: 202 10*3/uL (ref 150–450)
RBC: 4.3 x10E6/uL (ref 3.77–5.28)
RDW: 12.9 % (ref 11.7–15.4)
WBC: 10.8 10*3/uL (ref 3.4–10.8)

## 2020-01-30 LAB — RPR: RPR Ser Ql: NONREACTIVE

## 2020-01-30 LAB — GLUCOSE TOLERANCE, 2 HOURS W/ 1HR
Glucose, 1 hour: 128 mg/dL (ref 65–179)
Glucose, 2 hour: 71 mg/dL (ref 65–152)
Glucose, Fasting: 72 mg/dL (ref 65–91)

## 2020-02-19 ENCOUNTER — Telehealth (INDEPENDENT_AMBULATORY_CARE_PROVIDER_SITE_OTHER): Payer: Managed Care, Other (non HMO) | Admitting: Obstetrics and Gynecology

## 2020-02-19 ENCOUNTER — Other Ambulatory Visit: Payer: Self-pay

## 2020-02-19 ENCOUNTER — Encounter: Payer: Self-pay | Admitting: Obstetrics and Gynecology

## 2020-02-19 VITALS — BP 112/69 | HR 79

## 2020-02-19 DIAGNOSIS — B009 Herpesviral infection, unspecified: Secondary | ICD-10-CM

## 2020-02-19 DIAGNOSIS — R8271 Bacteriuria: Secondary | ICD-10-CM

## 2020-02-19 DIAGNOSIS — Z3A30 30 weeks gestation of pregnancy: Secondary | ICD-10-CM

## 2020-02-19 DIAGNOSIS — Z86718 Personal history of other venous thrombosis and embolism: Secondary | ICD-10-CM

## 2020-02-19 DIAGNOSIS — O0990 Supervision of high risk pregnancy, unspecified, unspecified trimester: Secondary | ICD-10-CM

## 2020-02-19 DIAGNOSIS — O98813 Other maternal infectious and parasitic diseases complicating pregnancy, third trimester: Secondary | ICD-10-CM

## 2020-02-19 DIAGNOSIS — O99820 Streptococcus B carrier state complicating pregnancy: Secondary | ICD-10-CM

## 2020-02-19 NOTE — Progress Notes (Signed)
OBSTETRICS PRENATAL VIRTUAL VISIT ENCOUNTER NOTE  Provider location: Center for [facility]   I connected with [place] on 02/19/20 at 10:35 AM EDT by MyChart Video Encounter at home and verified that I am speaking with the correct person using two identifiers.   I discussed the limitations, risks, security and privacy concerns of performing an evaluation and management service virtually and the availability of in person appointments. I also discussed with the patient that there may be a patient responsible charge related to this service. The patient expressed understanding and agreed to proceed. Subjective:  Olivia Hayes is a 24 y.o. G2P0010 at [redacted]w[redacted]d being seen today for ongoing prenatal care.  She is currently monitored for the following issues for this high-risk pregnancy and has OVERWEIGHT; Multiple allergies; Acne; Globus sensation; Thyroid disorder screening; Strep tonsillitis; Vaginal discharge; Tinea corporis; Encounter for wellness examination; Vaccine for diphtheria-tetanus-pertussis, combined; ADHD (attention deficit hyperactivity disorder) evaluation; History of DVT (deep vein thrombosis); Supervision of high risk pregnancy, antepartum; Herpes simplex; Pelvic floor dysfunction; GBS bacteriuria; Supervision of low-risk first pregnancy, third trimester; and Encounter for screening examination for impaired glucose regulation and diabetes mellitus on their problem list.  Patient reports no complaints.  Contractions: Not present. Vag. Bleeding: None.  Movement: Present. Denies any leaking of fluid.   The following portions of the patient's history were reviewed and updated as appropriate: allergies, current medications, past family history, past medical history, past social history, past surgical history and problem list.   Objective:   Vitals:   02/19/20 1043  BP: 112/69  Pulse: 79    Fetal Status:     Movement: Present     General:  Alert,  oriented and cooperative. Patient is in no acute distress.  Respiratory: Normal respiratory effort, no problems with respiration noted  Mental Status: Normal mood and affect. Normal behavior. Normal judgment and thought content.  Rest of physical exam deferred due to type of encounter  Imaging: No results found.  Assessment and Plan:  Pregnancy: G2P0010 at [redacted]w[redacted]d 1. Supervision of high risk pregnancy, antepartum Patient is doing well without complaints Normal glucola Follow up growth ultrasound scheduled  2. Herpes simplex Valtrex prophylaxis starting at 36 weeks  3. GBS bacteriuria Prophylaxis in labor  4. History of DVT (deep vein thrombosis) Continue lovenox daily for prophylaxis, Patient will be instructed when to discontinue in preparation for labor at a later date  Preterm labor symptoms and general obstetric precautions including but not limited to vaginal bleeding, contractions, leaking of fluid and fetal movement were reviewed in detail with the patient. I discussed the assessment and treatment plan with the patient. The patient was provided an opportunity to ask questions and all were answered. The patient agreed with the plan and demonstrated an understanding of the instructions. The patient was advised to call back or seek an in-person office evaluation/go to [facility] at [facility] for any urgent or concerning symptoms. Please refer to After Visit Summary for other counseling recommendations.   I provided 15 minutes of face-to-face time during this encounter.  Return in about 2 weeks (around 03/04/2020) for in person, ROB, High risk.  Future Appointments  Date Time Provider Department Center  02/25/2020 12:30 PM Beatris Si, PT [facility]  03/03/2020 12:30 PM Desenglau, Shireen Quan, PT [facility]  03/03/2020  2:15 PM [facility] NURSE [facility]  03/03/2020  2:30 PM [facility] US3 [facility]  03/11/2020 11:45 AM Desenglau, Shireen Quan, PT  [facility]  03/17/2020 12:30 PM Desenglau, Shireen Quan, PT [facility]    Catalina Antigua, MD Center for [employer], [facility]

## 2020-02-25 ENCOUNTER — Other Ambulatory Visit: Payer: Self-pay

## 2020-02-25 ENCOUNTER — Encounter: Payer: Self-pay | Admitting: Physical Therapy

## 2020-02-25 ENCOUNTER — Ambulatory Visit: Payer: Managed Care, Other (non HMO) | Attending: Nurse Practitioner | Admitting: Physical Therapy

## 2020-02-25 DIAGNOSIS — M545 Low back pain, unspecified: Secondary | ICD-10-CM

## 2020-02-25 DIAGNOSIS — G8929 Other chronic pain: Secondary | ICD-10-CM | POA: Insufficient documentation

## 2020-02-25 DIAGNOSIS — R293 Abnormal posture: Secondary | ICD-10-CM | POA: Insufficient documentation

## 2020-02-25 DIAGNOSIS — M6281 Muscle weakness (generalized): Secondary | ICD-10-CM | POA: Insufficient documentation

## 2020-02-25 NOTE — Therapy (Signed)
[facility] [address] [place], [place], [address] Phone: [phone]   Fax:  [phone]  Physical Therapy Evaluation  Patient Details  Name: Olivia Hayes MRN: [id number] Date of Birth: 12/24/1995 Referring Provider (PT): Currie Paris, NP   Encounter Date: 02/25/2020   PT End of Session - 02/25/20 1516    Visit Number 1    Date for PT Re-Evaluation 04/21/20    PT Start Time 1230    PT Stop Time 1307    PT Time Calculation (min) 37 min    Activity Tolerance Patient tolerated treatment well    Behavior During Therapy [facility] for tasks assessed/performed           Past Medical History:  Diagnosis Date  . Asthma    exercise induced  . BV (bacterial vaginosis)   . DVT (deep venous thrombosis) (HCC)    provoked by [facility] OCPs   . 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 Pelvic floor dysfunction   . Yeast infection     Past Surgical History:  Procedure Laterality Date  . ABDOMINAL HERNIA REPAIR  age 57  . INDUCED ABORTION      There were no vitals filed for this visit.    Subjective Assessment - 02/25/20 1233    Subjective I feel pain in my buttock area on both sides and goes back and forth.  My legs "lock up".  Has been worse since being pregnant.  Pt states the pain goes down the back/side of the upper thigh.    Pertinent History [redacted] weeks pregnant    Limitations Walking;Standing;Lifting;Sitting    How long can you stand comfortably? a few moments    How long can you walk comfortably? a few moments    Patient Stated Goals get rid of the pain    Currently in Pain? Yes    Pain Score 10-Worst pain ever   hurts to walk when I have it, 8-10/10   Pain Location Buttocks    Pain Orientation Right;Left    Pain Descriptors / Indicators Pins and needles   moves when I move, feels like a thick needle   Pain Type Chronic pain    Pain Onset More than a month ago    Pain Frequency  Intermittent    Aggravating Factors  lying on my side can make it hurt more, hurts when I wake up, can't sleep sometimes    Pain Relieving Factors goes away on its own, frog stretch on hands and knees, massage    Multiple Pain Sites No              [facility] PT Assessment - 02/25/20 0001      Assessment   Medical Diagnosis M54.30 (ICD-10-CM) - Sciatic nerve pain, unspecified laterality    Referring Provider (PT) Burleson, Brand Males, NP    Onset Date/Surgical Date --   since pregnancy   Prior Therapy No      Precautions   Precautions None      Restrictions   Weight Bearing Restrictions No      Balance Screen   Has the patient fallen in the past 6 months No      Home Environment   Living Environment Private residence    Living Arrangements Spouse/significant other   step son 50% time     Prior Function   Level of Independence Independent    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      Cognition   Overall Cognitive Status Within Functional Limits for tasks assessed      Posture/Postural Control   Posture/Postural Control Postural limitations    Postural Limitations Anterior pelvic tilt;Increased lumbar lordosis;Rounded Shoulders      ROM / Strength   AROM / PROM / Strength Strength;PROM;AROM      AROM   Overall AROM Comments decreased lumbar flexion 50%      PROM   Overall PROM Comments Lt hip flexion 50% +pain      Strength   Overall Strength Comments hip abduction 4/5 bil      Flexibility   Soft Tissue Assessment /Muscle Length yes    Hamstrings Rt 80%; Lt 90%      Palpation   SI assessment  bil instability in stork test;    Palpation comment TTP Lt SIJ; compression/distraction no pain; thrust pain Lt side      Ambulation/Gait   Gait Pattern Within Functional Limits                      Objective measurements completed on examination: See above findings.     Pelvic Floor Special Questions -  02/25/20 0001    Are you Pregnant or attempting pregnancy? Yes    Prior Pregnancies No    Currently Sexually Active Yes    Is this Painful No    Urinary Leakage Yes    Activities that cause leaking Sneezing    Urinary urgency Yes    Fecal incontinence No                         PT Long Term Goals - 02/25/20 1523      PT LONG TERM GOAL #1   Title independent with HEP and how to progress herself    Baseline not educated yet    Time 8    Period Weeks    Status New    Target Date 04/21/20      PT LONG TERM GOAL #2   Title back pain reduced by at least 60%    Baseline 8-10/10 unable to walk    Time 8    Period Weeks    Status New    Target Date 04/21/20      PT LONG TERM GOAL #3   Title able to cough and sneeze without leakage    Baseline small amount sometimes    Time 8    Period Weeks    Status New    Target Date 04/21/20      PT LONG TERM GOAL #4   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  Pt has bilateral LE and core weakness as demonstrated above.  Pt has tight and TTP gluteal and lumbar paraspinals. Pt has pain to palpation of the Lt SI.  pt has instability in both sides with SLS demonstrating trendelenburg.  Pt has posture abnormalities as mention.  She will benefit from skilled PT to work on  core strength and posture for maximum function during pregnancy.    Personal Factors and Comorbidities Age    Stability/Clinical Decision Making Stable/Uncomplicated    Clinical Decision Making Low    Rehab Potential Excellent    PT Frequency 1x / week    PT Duration 8 weeks    PT Treatment/Interventions ADLs/Self Care Home Management;Biofeedback;Cryotherapy;Electrical Stimulation;Moist  Heat;Neuromuscular re-education;Therapeutic exercise;Therapeutic activities;Patient/family education;Passive range of motion;Manual techniques    PT Next Visit Plan STM and stretch lumbar and glutes; core strength    Consulted and Agree with Plan of Care Patient           Patient will benefit from skilled therapeutic intervention in order to improve the following deficits and impairments:  Decreased strength, Postural dysfunction, Pain, Increased muscle spasms  Visit Diagnosis: Abnormal posture  Chronic bilateral low back pain, unspecified whether sciatica present  Muscle weakness (generalized)     Problem List Patient Active Problem List   Diagnosis Date Noted  . Supervision of low-risk first pregnancy, third trimester 01/29/2020  . Encounter for screening examination for impaired glucose regulation and diabetes mellitus 01/29/2020  . GBS bacteriuria 09/28/2019  . Supervision of high risk pregnancy, antepartum 09/27/2019  . Pelvic floor dysfunction   . History of DVT (deep vein thrombosis) 08/24/2019  . Herpes simplex 10/15/2018  . Tinea corporis 01/06/2018  . Encounter for wellness examination 01/06/2018  . Vaccine for diphtheria-tetanus-pertussis, combined 01/06/2018  . ADHD (attention deficit hyperactivity disorder) evaluation 01/06/2018  . Vaginal discharge 12/26/2017  . Strep tonsillitis 04/26/2017  . Globus sensation 07/11/2015  . Thyroid disorder screening 07/11/2015  . Acne 03/28/2013  . Multiple allergies 02/21/2013  . OVERWEIGHT 04/03/2010    Junious Silk, PT 02/25/2020, 3:29 PM  [facility] [address] [place], [place], [address] Phone: [phone]   Fax:  [phone]  Name: Olivia Hayes MRN: [id number] Date of Birth: Apr 29, 1996

## 2020-03-03 ENCOUNTER — Ambulatory Visit: Payer: Managed Care, Other (non HMO) | Admitting: *Deleted

## 2020-03-03 ENCOUNTER — Encounter: Payer: Self-pay | Admitting: Physical Therapy

## 2020-03-03 ENCOUNTER — Encounter: Payer: Self-pay | Admitting: *Deleted

## 2020-03-03 ENCOUNTER — Ambulatory Visit: Payer: Managed Care, Other (non HMO) | Admitting: Physical Therapy

## 2020-03-03 ENCOUNTER — Other Ambulatory Visit: Payer: Self-pay

## 2020-03-03 ENCOUNTER — Ambulatory Visit: Payer: Managed Care, Other (non HMO) | Attending: Obstetrics and Gynecology

## 2020-03-03 ENCOUNTER — Other Ambulatory Visit: Payer: Self-pay | Admitting: *Deleted

## 2020-03-03 DIAGNOSIS — Z86718 Personal history of other venous thrombosis and embolism: Secondary | ICD-10-CM | POA: Insufficient documentation

## 2020-03-03 DIAGNOSIS — Z3A32 32 weeks gestation of pregnancy: Secondary | ICD-10-CM | POA: Diagnosis not present

## 2020-03-03 DIAGNOSIS — O2233 Deep phlebothrombosis in pregnancy, third trimester: Secondary | ICD-10-CM | POA: Diagnosis not present

## 2020-03-03 DIAGNOSIS — M6289 Other specified disorders of muscle: Secondary | ICD-10-CM

## 2020-03-03 DIAGNOSIS — R293 Abnormal posture: Secondary | ICD-10-CM

## 2020-03-03 DIAGNOSIS — G8929 Other chronic pain: Secondary | ICD-10-CM

## 2020-03-03 DIAGNOSIS — O0990 Supervision of high risk pregnancy, unspecified, unspecified trimester: Secondary | ICD-10-CM | POA: Insufficient documentation

## 2020-03-03 DIAGNOSIS — M6281 Muscle weakness (generalized): Secondary | ICD-10-CM

## 2020-03-03 DIAGNOSIS — Z362 Encounter for other antenatal screening follow-up: Secondary | ICD-10-CM | POA: Diagnosis not present

## 2020-03-03 DIAGNOSIS — R8271 Bacteriuria: Secondary | ICD-10-CM | POA: Diagnosis present

## 2020-03-03 NOTE — Patient Instructions (Signed)
Access Code: [email or web address] URL: [email or web address] Date: 03/03/2020 Prepared by: Dwana Curd  Exercises Pelvic Tilt - 1 x daily - 7 x weekly - 3 sets - 10 reps Standing with Back Flat Against Wall - 1 x daily - 7 x weekly - 10 reps - 1 sets - 5 sec hold Quadruped Transversus Abdominis Bracing - 1 x daily - 7 x weekly - 10 reps - 2 sets - 5 sec hold Quadruped Alternating Shoulder Flexion - 1 x daily - 7 x weekly - 3 sets - 10 reps Supine Figure 4 Piriformis Stretch - 1 x daily - 7 x weekly - 3 reps - 1 sets - 30 sec hold Standing Hamstring Stretch with Step - 1 x daily - 7 x weekly - 3 reps - 1 sets - 30 sec hold

## 2020-03-03 NOTE — Therapy (Signed)
[facility] [address] [place], [place], [address] Phone: [phone]   Fax:  [phone]  Physical Therapy Treatment  Patient Details  Name: Olivia Hayes MRN: [id number] Date of Birth: 07/31/1995 Referring Provider (PT): Currie Paris, NP   Encounter Date: 03/03/2020   PT End of Session - 03/03/20 1232    Visit Number 2    Date for PT Re-Evaluation 04/21/20    PT Start Time 1231    PT Stop Time 1309    PT Time Calculation (min) 38 min    Activity Tolerance Patient tolerated treatment well    Behavior During Therapy [facility] for tasks assessed/performed           Past Medical History:  Diagnosis Date  . Asthma    exercise induced  . BV (bacterial vaginosis)   . DVT (deep venous thrombosis) (HCC)    provoked by [facility] OCPs   . 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 Pelvic floor dysfunction   . Yeast infection     Past Surgical History:  Procedure Laterality Date  . ABDOMINAL HERNIA REPAIR  age 35  . INDUCED ABORTION      There were no vitals filed for this visit.   Subjective Assessment - 03/03/20 1358    Subjective The piriformis stretch is helping.  Pain is more at the end of the day now.    Currently in Pain? No/denies                             [facility] Adult PT Treatment/Exercise - 03/03/20 0001      Exercises   Exercises Lumbar      Lumbar Exercises: Stretches   Active Hamstring Stretch Right;Left;30 seconds    Pelvic Tilt 10 reps;5 seconds    Pelvic Tilt Limitations against wall and in supine      Lumbar Exercises: Seated   Other Seated Lumbar Exercises circles and pelvic tilts 10x each      Lumbar Exercises: Quadruped   Other Quadruped Lumbar Exercises TrA activation and rocking qped and modified on table in standing      Manual Therapy   Manual therapy comments bilat glutes and lumbar paraspinals Lt>Rt gluteals                   PT Education - 03/03/20 1309    Education Details Access Code: [email or web address]) Educated Patient    Methods Explanation;Demonstration;Verbal cues;Handout    Comprehension Verbalized understanding;Returned demonstration               PT Long Term Goals - 02/25/20 1523      PT LONG TERM GOAL #1   Title independent with HEP and how to progress herself    Baseline not educated yet    Time 8    Period Weeks    Status New    Target Date 04/21/20      PT LONG TERM GOAL #2   Title back pain reduced by at least 60%    Baseline 8-10/10 unable to walk    Time 8    Period Weeks    Status New    Target Date 04/21/20      PT LONG TERM GOAL #3   Title able to cough and sneeze without leakage    Baseline small amount sometimes    Time 8  Period Weeks    Status New    Target Date 04/21/20      PT LONG TERM GOAL #4   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  she was able to add to HEP today and added core strengthening.  Pt needs cues to keep from tightening the lumbar paraspinals in qped.  Pt did well with leaning on the table for core strengthening and able to maintain a good posture.  Continue with strength per POC.    PT Treatment/Interventions ADLs/Self Care Home Management;Biofeedback;Cryotherapy;Electrical Stimulation;Moist Heat;Neuromuscular re-education;Therapeutic exercise;Therapeutic activities;Patient/family education;Passive range of motion;Manual techniques    PT Next Visit Plan progress core strength and HEP    PT Home Exercise Plan Access Code: [email or web address]    Consulted and Agree with Plan of Care Patient           Patient will benefit from skilled therapeutic intervention in order to improve the following deficits and  impairments:  Decreased strength, Postural dysfunction, Pain, Increased muscle spasms  Visit Diagnosis: Abnormal posture  Chronic bilateral low back pain, unspecified whether sciatica present  Muscle weakness (generalized)     Problem List Patient Active Problem List   Diagnosis Date Noted  . Supervision of low-risk first pregnancy, third trimester 01/29/2020  . Encounter for screening examination for impaired glucose regulation and diabetes mellitus 01/29/2020  . GBS bacteriuria 09/28/2019  . Supervision of high risk pregnancy, antepartum 09/27/2019  . Pelvic floor dysfunction   . History of DVT (deep vein thrombosis) 08/24/2019  . Herpes simplex 10/15/2018  . Tinea corporis 01/06/2018  . Encounter for wellness examination 01/06/2018  . Vaccine for diphtheria-tetanus-pertussis, combined 01/06/2018  . ADHD (attention deficit hyperactivity disorder) evaluation 01/06/2018  . Vaginal discharge 12/26/2017  . Strep tonsillitis 04/26/2017  . Globus sensation 07/11/2015  . Thyroid disorder screening 07/11/2015  . Acne 03/28/2013  . Multiple allergies 02/21/2013  . OVERWEIGHT 04/03/2010    Junious Silk, PT 03/03/2020, 2:04 PM  [facility] [address] [place], [place], [address] Phone: [phone]   Fax:  [phone]  Name: Avarose Mervine MRN: [id number] Date of Birth: October 20, 1995

## 2020-03-05 ENCOUNTER — Encounter: Payer: Managed Care, Other (non HMO) | Admitting: Family Medicine

## 2020-03-11 ENCOUNTER — Telehealth: Payer: Self-pay | Admitting: Physical Therapy

## 2020-03-11 ENCOUNTER — Ambulatory Visit: Payer: Managed Care, Other (non HMO) | Attending: Nurse Practitioner | Admitting: Physical Therapy

## 2020-03-11 DIAGNOSIS — R293 Abnormal posture: Secondary | ICD-10-CM | POA: Insufficient documentation

## 2020-03-11 DIAGNOSIS — G8929 Other chronic pain: Secondary | ICD-10-CM | POA: Insufficient documentation

## 2020-03-11 DIAGNOSIS — M545 Low back pain: Secondary | ICD-10-CM | POA: Insufficient documentation

## 2020-03-11 DIAGNOSIS — M6281 Muscle weakness (generalized): Secondary | ICD-10-CM | POA: Insufficient documentation

## 2020-03-11 NOTE — Telephone Encounter (Signed)
Patient did not show for appointment.  Patient was called and PT left message to please call us back.  Russella Dar, PT 03/11/20 12:04 PM

## 2020-03-17 ENCOUNTER — Encounter: Payer: Self-pay | Admitting: Physical Therapy

## 2020-03-17 ENCOUNTER — Ambulatory Visit: Payer: Managed Care, Other (non HMO) | Admitting: Physical Therapy

## 2020-03-17 ENCOUNTER — Other Ambulatory Visit: Payer: Self-pay

## 2020-03-17 DIAGNOSIS — M545 Low back pain: Secondary | ICD-10-CM | POA: Diagnosis present

## 2020-03-17 DIAGNOSIS — G8929 Other chronic pain: Secondary | ICD-10-CM | POA: Diagnosis present

## 2020-03-17 DIAGNOSIS — M6281 Muscle weakness (generalized): Secondary | ICD-10-CM | POA: Diagnosis present

## 2020-03-17 DIAGNOSIS — R293 Abnormal posture: Secondary | ICD-10-CM

## 2020-03-17 NOTE — Therapy (Addendum)
[facility] [address], [place], [place], [address] Phone: [phone]   Fax:  [phone]  Physical Therapy Treatment  Patient Details  Name: Olivia Hayes MRN: [id number] Date of Birth: [birth year] Referring Provider (PT): Virginia Rochester, NP   Encounter Date: 03/17/2020   PT End of Session - 03/17/20 1610    Visit Number 3    Date for PT Re-Evaluation 04/21/20    PT Start Time 1234    PT Stop Time 1312    PT Time Calculation (min) 38 min    Activity Tolerance Patient tolerated treatment well    Behavior During Therapy [facility] for tasks assessed/performed           Past Medical History:  Diagnosis Date  . Asthma    exercise induced  . BV (bacterial vaginosis)   . DVT (deep venous thrombosis) ([place])    provoked by [employer] OCPs   . 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 Pelvic floor dysfunction   . Yeast infection     Past Surgical History:  Procedure Laterality Date  . ABDOMINAL HERNIA REPAIR  age 47  . INDUCED ABORTION      There were no vitals filed for this visit.   Subjective Assessment - 03/17/20 1236    Subjective Pt states she is having pain every night when    Pertinent History 34 weeks    Limitations Walking;Standing;Lifting;Sitting    Patient Stated Goals get rid of the pain    Currently in Pain? No/denies                             [facility] Adult PT Treatment/Exercise - 03/17/20 0001      Therapeutic Activites    Therapeutic Activities Other Therapeutic Activities    Other Therapeutic Activities log rolling technique      Lumbar Exercises: Standing   Other Standing Lumbar Exercises pelvic tilts and pelvic tilt with wall slide      Lumbar Exercises: Seated   Long Arc Quad on Chair Strengthening;Both;20 reps    LAQ on Chair Limitations ball squeeze      Manual Therapy   Manual therapy comments bilat glutes and lumbar paraspinals Lt>Rt  gluteals; addaday and sacral distraction; trigger point release in gluteals Lt>Rt                       PT Long Term Goals - 03/17/20 1609      PT LONG TERM GOAL #1   Title independent with HEP and how to progress herself    Baseline not able to progress herself yet    Status On-going      PT LONG TERM GOAL #2   Title back pain reduced by at least 60%    Baseline no pain walking or at work; pain only at night    Status On-going      PT LONG TERM GOAL #3   Title able to cough and sneeze without leakage    Status On-going      PT LONG TERM GOAL #4   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  Pt has made  progress in return to work without pain.  She is now experiencing pain at night when rolling in bed.  Pt educated and performed log roll with pencil skirt technique.  Exericses for core and adductor strength were added today. Pt has trigger points in gluteals Lt >Rt.  Pt will benefit from skilled PT to continue to work on core and soft tissue lengthening for greatest function and safety during pregnancy.    PT Treatment/Interventions ADLs/Self Care Home Management;Biofeedback;Cryotherapy;Electrical Stimulation;Moist Heat;Neuromuscular re-education;Therapeutic exercise;Therapeutic activities;Patient/family education;Passive range of motion;Manual techniques    PT Next Visit Plan progress core strength and HEP    PT Home Exercise Plan Access Code: [email or web address]    Consulted and Agree with Plan of Care Patient           Patient will benefit from skilled therapeutic intervention in order to improve the following deficits and impairments:  Decreased strength, Postural dysfunction, Pain, Increased muscle spasms  Visit Diagnosis: Abnormal posture  Chronic bilateral low back pain, unspecified whether sciatica present  Muscle weakness  (generalized)     Problem List Patient Active Problem List   Diagnosis Date Noted  . Supervision of low-risk first pregnancy, third trimester 01/29/2020  . Encounter for screening examination for impaired glucose regulation and diabetes mellitus 01/29/2020  . GBS bacteriuria 09/28/2019  . Supervision of high risk pregnancy, antepartum 09/27/2019  . Pelvic floor dysfunction   . History of DVT (deep vein thrombosis) 08/24/2019  . Herpes simplex 10/15/2018  . Tinea corporis 01/06/2018  . Encounter for wellness examination 01/06/2018  . Vaccine for diphtheria-tetanus-pertussis, combined 01/06/2018  . ADHD (attention deficit hyperactivity disorder) evaluation 01/06/2018  . Vaginal discharge 12/26/2017  . Strep tonsillitis 04/26/2017  . Globus sensation 07/11/2015  . Thyroid disorder screening 07/11/2015  . Acne 03/28/2013  . Multiple allergies 02/21/2013  . OVERWEIGHT 04/03/2010    Jule Ser, PT 03/17/2020, 5:24 PM  [facility] [address], [place], [place], [address] Phone: [phone]   Fax:  [phone]  Name: Olivia Hayes MRN: [id number] Date of Birth: 10-25-1995  PHYSICAL THERAPY DISCHARGE SUMMARY  Visits from Start of Care: 3  Current functional level related to goals / functional outcomes: See above goals   Remaining deficits: See above   Education / Equipment: HEP  Plan: Patient agrees to discharge.  Patient goals were partially met. Patient is being discharged due to not returning since the last visit.  ?????     Delivery date 10/17  [facility], PT 05/01/20 8:02 AM

## 2020-03-20 ENCOUNTER — Encounter: Payer: Self-pay | Admitting: Obstetrics and Gynecology

## 2020-03-20 ENCOUNTER — Other Ambulatory Visit: Payer: Self-pay

## 2020-03-20 ENCOUNTER — Ambulatory Visit (INDEPENDENT_AMBULATORY_CARE_PROVIDER_SITE_OTHER): Payer: Managed Care, Other (non HMO) | Admitting: Obstetrics and Gynecology

## 2020-03-20 VITALS — BP 99/62 | HR 88 | Wt 178.7 lb

## 2020-03-20 DIAGNOSIS — B009 Herpesviral infection, unspecified: Secondary | ICD-10-CM

## 2020-03-20 DIAGNOSIS — O0990 Supervision of high risk pregnancy, unspecified, unspecified trimester: Secondary | ICD-10-CM

## 2020-03-20 DIAGNOSIS — R8271 Bacteriuria: Secondary | ICD-10-CM

## 2020-03-20 DIAGNOSIS — Z86718 Personal history of other venous thrombosis and embolism: Secondary | ICD-10-CM

## 2020-03-20 NOTE — Patient Instructions (Signed)
AREA PEDIATRIC/FAMILY PRACTICE PHYSICIANS  [place] ([address]) . [facility] o Chambliss, MD; Eniola, MD; Hale, MD; Hensel, MD; McDiarmid, MD; McIntyer, MD; Neal, MD; Walden, MD o [address] [place], [place] [address] o [phone] o Mon-Fri 8:30-12:30, 1:30-5:00 o Providers come to see babies at [facility] o Accepting Medicaid . [facility] o Limited providers who accept newborns: Koirala, MD; Morrow, MD; Wolters, MD o [address], [place], [place] [address] o [phone] o Mon-Fri 8:00-5:30 o Babies seen by providers at [facility] o Does NOT accept Medicaid o Please call early in hospitalization for appointment (limited availability)  . [facility] o Mulberry, MD o [address] [place], [place] [address] o [phone] o Mon, Tue, Thur, Fri 8:30-5:00, Wed 10:00-7:00 (closed 1-2pm) o Babies seen by [facility] providers o Accepting Medicaid . [facility] o Rubin, MD o [address], [place], [place] [address] o [phone] o Mon-Fri 8:30-5:00, Sat 8:30-12:00 o Provider comes to see babies at [facility] o Accepting Medicaid o Must have been referred from current patients or contacted office prior to delivery . [facility] ([facility]) o Brown, MD; Chandler, MD; Ettefagh, MD; Grant, MD; Lester, MD; McCormick, MD; McQueen, MD; Prose, MD; Simha, MD; Stanley, MD; Stryffeler, NP; Tebben, NP o [address], [place], [place] [address] o [phone] o Mon, Tue, Thur, Fri 8:30-5:30, Wed 9:30-5:30, Sat 8:30-12:30 o Babies seen by [facility] providers o Accepting Medicaid o Only accepting infants of first-time parents or siblings of current patients o Hospital discharge coordinator will make follow-up appointment . Jack Amos o [address],  [place], [place]  [address] o [phone]   Fax - [phone] . [facility] o [address], [place], [place]  [address] o Phone - [phone]   Fax - [phone] . Shilpa Gosrani o [address], [place], [place]  [address] o [phone]  [place] ([address]) . [facility] o Bates, MD; Brassfield, MD; Cooper, Cox, MD; MD; Davis, MD; Dovico, MD; Ettefaugh, MD; Little, MD; Lowe, MD; Keiffer, MD; Melvin, MD; Sumner, MD; Williams, MD o [address], [place], [place] [address] o [phone] o Mon-Fri 8:30-5:00 (extended evenings Mon-Thur as needed), Sat-Sun 10:00-1:00 o Providers come to see babies at [facility] o Accepting Medicaid for families of first-time babies and families with all children in the household age 3 and under. Must register with office prior to making appointment (M-F only). . [facility] o Henson, NP; Knapp, MD; Lalonde, MD; Tysinger, PA o [address] [place], [place] [address] o [phone] o Mon-Fri 8:00-5:00 o Babies seen by providers at [facility] o Does NOT accept Medicaid/Commercial Insurance Only . [facility] ([facility]  o Artis, MD; Barnes, MD; Bratton, MD; Coccaro, MD; [place] Gardner, MD; Kramer, MD; Marshall, MD; Netherton, MD; Poleto, MD; Skinner, MD o [address] [place], [place] [address] o [phone] o Mon-Fri 8:30-5:30, Sat (Oct.-Mar.) 9:00-1:00 o Babies seen by providers at [facility] o Accepting Medicaid  [place] ([address]) . [facility] o Reid, MD; Warner, MD o [address], , [place] [address] o [phone] o Mon-Fri 8:30-5:00, Sat 8:30-12:00 o Providers come to see babies at [facility] o Does NOT accept Medicaid . [facility] o Becker, PA; Hagler, MD; Scifres, PA; Sun, MD; Swayne, MD o [address],  [place], [place] [address] o [phone] o Mon-Fri 8:00-5:00 o Babies seen by providers at [facility] o Does NOT accept Medicaid o Only accepting babies of parents who are patients o Please call early in hospitalization for appointment (limited availability) . [facility] o Clark, MD; Frye, MD; Kelleher, MD; Mack, NP; Miller, MD; O'Keller, MD; Patterson, NP; Pudlo, MD; Puzio, MD; Thomas, MD; Tucker, MD; Twiselton, MD o [address], [place], [place] [address] o [phone] o Mon-Fri 8:00-5:00, Sat 9:00-12:00 o Providers come to see babies at [facility] o Does NOT accept Medicaid  [place] ([address]) . [facility] o Limited providers accepting new patients: Brake, NP; Wharton, PA o [address], [place], [place] [address] o [phone] o Mon-Fri 8:00-5:00 o Babies seen by providers at [facility] o Does NOT accept Medicaid o Only accepting babies of parents who are patients o Please call early in hospitalization for appointment (limited availability) . [facility] o Gay, MD; Quinlan, MD o [address] [place], [place] [address] o [phone] (press 1 to schedule appointment) o Mon-Fri 8:00-5:00 o Providers come to see babies at [facility] o Does NOT accept Medicaid . [facility] o Mazer, MD o [address] [place], [place] [address] o [phone] o Mon-Fri 8:30-5:00 (lunch 12:30-1:00), extended hours by appointment only Wed 5:00-6:30 o Babies seen by [facility] providers o Accepting Medicaid . [facility] o Banks, MD; Jordan, MD; Koberlein, MD o [address], [place], [place] [address] o [phone] o Mon-Fri 8:00-5:00 o Babies seen by [facility] providers o Does NOT accept Medicaid . [facility] o Parker, MD; Hunter, MD; Wallace, DO o [address] [place], [place]  [address] o [phone] o Mon-Fri 8:00-5:00 o Babies seen by [facility] providers o Does NOT accept Medicaid . [facility] o Brandon, PA; Brecken, PA; Christy, NP; Dees, MD; DeClaire, MD; DeWeese, MD; Hansen, NP; Mills, NP; Parrish, NP; Smoot, NP; Summer, MD; Vapne, MD o [address] [place], [place] [address] o [phone] o Mon-Fri 8:30-5:00, Sat 10:00-1:00 o Providers come to see babies at [facility] o Does NOT accept Medicaid o Free prenatal information session Tuesdays at 4:45pm . [facility] o Bouska, MD; Gordon, PA; Jeffery, PA; Weber, PA o [address] [place] [address] o [phone] o Mon-Fri 7:30-5:30 o Babies seen by [facility] providers . [facility] o [address], [place], [place]  [address] o [phone]   Fax - [phone]  [place] ([address] & [address]) . [facility] o Reese, MD o [address] [place], [place] [address] o [phone] o Mon-Thur 8:00-6:00 o Providers come to see babies at [facility] o Accepting Medicaid . [facility] o Anderson, NP; Badger, MD; Beal, PA; Spencer, PA o [address] [place], [place] [address] o [phone] o Mon-Thur 7:30-7:30, Fri 7:30-4:30 o Babies seen by [facility] providers o Accepting Medicaid . [facility] o Agbuya, MD; Klett, NP; Romgoolam, MD o [address], [place], [place] [address] o [phone] o Mon-Fri 8:30-5:00, Sat 8:30-12:00 o Providers come to see babies at [facility] o Accepting Medicaid o Must have "Meet & Greet" appointment at office prior to delivery . [facility] o McCord,   MD; Wallace, MD; Wood, MD o [address], [place], [place] [address] o [phone] o Mon-Wed 8:00-6:00, Thur-Fri 8:00-5:00, Sat 9:00-12:00 o Providers come to  see babies at [facility] o Does NOT accept Medicaid o Only accepting siblings of current patients . [facility]  o [address], [place], [place]  [address] o [phone]   Fax - [phone] . [facility] o [address], [place], [place]  [address] o [phone]   Fax - [phone]  [place] ([address]) . [facility] o Cirigliano, DO; Matthews, DO o [address] [place], [place] [address] o [phone] o Mon-Fri 7:00-5:00 o Babies seen by [facility] providers o Does NOT accept Medicaid . [facility] o Briscoe, MD; Howley, PA; Moreira, PA o [address], [place], [place] [address] o [phone] o Mon-Fri 8:00-5:00 o Babies seen by [facility] providers o Accepting Medicaid . [facility] o Boyd, MD; Church, PA; Jones, NP; Osborn, PA o [address], [place], [place] [address] o [phone] o Mon-Fri 8:00-5:00 o Babies seen by providers at [facility] o Accepting Medicaid  [place] ([address]) .  [facility] [place] o [place]ling, DO o [address] [place], [place] [address] o [phone] o Mon-Fri 8:00-5:00 o Babies seen by [facility] providers o Does NOT accept Medicaid o Limited availability, please call early in hospitalization to schedule follow-up . [facility] o Calderon, PA; Cummings, MD; Dillard, MD; Martin, PA; Olson, MD; VanDeven, PA o [address], [place], [place] [address] o [phone] o Mon-Fri 8:30-5:00, Sat 9:00-12:00 o Babies seen by providers at [facility] o Accepting Medicaid o Please register online then schedule online or call office o [email or web address] . [facility] o Hunter, NP; Kumar, MD; Martin Rogers, PA o [address], [place], [place] [address] o [phone] o Mon-Fri 8:00-5:00 o Babies seen by providers at [facility] o Accepting Medicaid . [facility] ([facility] o Lemont, MD; Kristi Fleenor, NP; West, MD o [address], [place], [place] [address] o [phone] o Mon-Fri 8:00-5:30, Sat&Sun by appointment (phones open at 8:30) o Babies seen by [facility] providers o Accepting Medicaid o Must be a first-time baby or sibling of current patient . [facility] [place]  o [address], [place], [place]  [address] o [phone]   Fax - [phone]  [place] ([address]) . [facility] o Brown, PA; Cowen, PA; Rice, MD; Helton, PA; Spry, MD o [address] [place], [place] [address] o [phone] o Mon-Thur 8:00-7:00, Fri 8:00-5:00, Sat 8:00-12:00, Sun 9:00-12:00 o Babies seen by [facility] providers o Accepting Medicaid . [facility] o Coe-Goins, MD; Marshall, MD; Pierre-Louis, MD o [address] [place] [address], [place], [place] [address] o [phone] o Mon-Thur 8:00-5:00 o Babies seen by providers at [facility] o Accepting Medicaid . [facility] o Bratton, MD; Coe-Goins, MD; Hayes, MD; Lewis, MD; List, MD; Lott, MD; Marshall, MD; Moran, MD; O'Neal, MD; Pierre-Louis, MD; Pitonzo, MD; Scholer, MD; Spangle, MD o [address] [place], [place]   [address] o [phone] o Mon-Fri 8:00-5:30, Sat (Oct.-Mar.) 9:00-1:00 o Babies seen by providers at [facility] o Accepting Medicaid o Must fill out new patient packet, available online at [email or web address] . [facility] ([facility] o Friddle, NP; Harris, NP; Kelly, NP; Logan, MD;  Melvin, PA; Poth, MD; Ramadoss, MD; Stanton, NP o [address], [place], [place] [address] o [phone] o Mon-Thur 8:00-5:30, Fri 8:00-5:00 o Babies seen by providers at [facility] o Accepting Medicaid  [place] ([address]) . [facility] o Dixon, PA; Derwood, MD; Pickard, MD; Tapia, PA o [address], [place], [place] [address] o [phone] o Mon-Fri 8:00-5:00 o Babies seen by providers at [facility] o Accepting Medicaid   [place] ([address]) . [facility] [place] o Masneri, DO; Meyers, MD; Nelson, PA o [address], [place], [place] [address] o [phone] o Mon-Fri 8:00-5:00 o Babies seen by providers at [facility] o Does NOT accept Medicaid o Limited appointment availability, please call early in hospitalization  . [facility] [place] o Kunedd, DO; McGowen, MD o [address], [place], [place] [address] o [phone] o Mon-Fri 8:00-5:00 o Babies seen by [facility] providers o Does NOT accept Medicaid . [facility] [place] o Cameron, MD; MacDonald, MD; Michaels, PA; Nayak, MD o [address] [place] [address], [place], [place] [address] o [phone] o Mon-Fri 8:00-5:00 o After hours clinic ([address] [place], [place] [address]) [phone] Mon-Fri 5:00-8:00, Sat 12:00-6:00, Sun 10:00-4:00 o Babies seen by [facility] providers o Accepting Medicaid . [facility] [place] o [address], [place], [place]  [address] o [phone]   Fax - [phone]  [place] ([address]) . [facility] [place] [facility] o Andy, MD o [address], [place], [place] [address] o [phone] o Mon-Fri 8:00-5:00 o Babies seen by [facility] providers o Does NOT accept Medicaid . [facility] [place] ([facility] [place]) o Eksir, MD o [address], [place], [place]  [address] o [phone] o Mon-Thur 8:00-7:00, Fri 8:00-5:00, Sat 8:00-12:00 o Babies seen by providers at [facility] o Accepting Medicaid - but does not have vaccinations in office (must be received elsewhere) o Limited availability, please call early in hospitalization  [place] ([address]) . [facility]  o Charlene Flemming, MD o [address], [place] [address] o [phone]  Fax [phone]   

## 2020-03-20 NOTE — Progress Notes (Signed)
   PRENATAL VISIT NOTE  Subjective:  Olivia Hayes is a 24 y.o. G2P0010 at [redacted]w[redacted]d being seen today for ongoing prenatal care.  She is currently monitored for the following issues for this high-risk pregnancy and has OVERWEIGHT; Multiple allergies; Acne; Globus sensation; Thyroid disorder screening; Strep tonsillitis; Vaginal discharge; Tinea corporis; Encounter for wellness examination; Vaccine for diphtheria-tetanus-pertussis, combined; ADHD (attention deficit hyperactivity disorder) evaluation; History of DVT (deep vein thrombosis); Supervision of high risk pregnancy, antepartum; Herpes simplex; Pelvic floor dysfunction; GBS bacteriuria; Supervision of low-risk first pregnancy, third trimester; and Encounter for screening examination for impaired glucose regulation and diabetes mellitus on their problem list.  Patient reports no complaints.  Contractions: Irritability. Vag. Bleeding: None.  Movement: Present. Denies leaking of fluid.   The following portions of the patient's history were reviewed and updated as appropriate: allergies, current medications, past family history, past medical history, past social history, past surgical history and problem list.   Objective:   Vitals:   03/20/20 1507  BP: 99/62  Pulse: 88  Weight: 178 lb 11.2 oz (81.1 kg)    Fetal Status: Fetal Heart Rate (bpm): 142 Fundal Height: 34 cm Movement: Present     General:  Alert, oriented and cooperative. Patient is in no acute distress.  Skin: Skin is warm and dry. No rash noted.   Cardiovascular: Normal heart rate noted  Respiratory: Normal respiratory effort, no problems with respiration noted  Abdomen: Soft, gravid, appropriate for gestational age.  Pain/Pressure: Present     Pelvic: Cervical exam deferred        Extremities: Normal range of motion.  Edema: None  Mental Status: Normal mood and affect. Normal behavior. Normal judgment and thought content.   Assessment and Plan:  Pregnancy: G2P0010 at  [redacted]w[redacted]d 1. Supervision of high risk pregnancy, antepartum Patient is doing well without complaints Cultures next visit Follow up growth ultrasound  2. History of DVT (deep vein thrombosis) Continue lovenox  3. GBS bacteriuria Prophylaxis in labor  4. Herpes simplex Prophylaxis at next visit  Preterm labor symptoms and general obstetric precautions including but not limited to vaginal bleeding, contractions, leaking of fluid and fetal movement were reviewed in detail with the patient. Please refer to After Visit Summary for other counseling recommendations.   Return in about 2 weeks (around 04/03/2020) for in person, ROB, High risk.  Future Appointments  Date Time Provider Department Center  03/31/2020  1:30 PM [facility] NURSE [facility]  03/31/2020  1:45 PM [facility] US5 [facility]  04/03/2020  2:00 PM Desenglau, Shireen Quan, PT [facility]  05/01/2020 11:00 AM Desenglau, Shireen Quan, PT [facility]    Catalina Antigua, MD

## 2020-03-31 ENCOUNTER — Ambulatory Visit: Payer: Managed Care, Other (non HMO) | Attending: Obstetrics and Gynecology

## 2020-03-31 ENCOUNTER — Other Ambulatory Visit: Payer: Self-pay

## 2020-03-31 ENCOUNTER — Encounter: Payer: Self-pay | Admitting: *Deleted

## 2020-03-31 ENCOUNTER — Ambulatory Visit: Payer: Managed Care, Other (non HMO) | Admitting: *Deleted

## 2020-03-31 ENCOUNTER — Telehealth: Payer: Self-pay | Admitting: *Deleted

## 2020-03-31 DIAGNOSIS — O0990 Supervision of high risk pregnancy, unspecified, unspecified trimester: Secondary | ICD-10-CM | POA: Diagnosis present

## 2020-03-31 DIAGNOSIS — M6289 Other specified disorders of muscle: Secondary | ICD-10-CM | POA: Diagnosis present

## 2020-03-31 DIAGNOSIS — R8271 Bacteriuria: Secondary | ICD-10-CM | POA: Diagnosis present

## 2020-03-31 DIAGNOSIS — Z3200 Encounter for pregnancy test, result unknown: Secondary | ICD-10-CM

## 2020-03-31 DIAGNOSIS — Z3A36 36 weeks gestation of pregnancy: Secondary | ICD-10-CM

## 2020-03-31 DIAGNOSIS — Z86718 Personal history of other venous thrombosis and embolism: Secondary | ICD-10-CM | POA: Diagnosis present

## 2020-03-31 DIAGNOSIS — Z362 Encounter for other antenatal screening follow-up: Secondary | ICD-10-CM

## 2020-03-31 DIAGNOSIS — O99891 Other specified diseases and conditions complicating pregnancy: Secondary | ICD-10-CM | POA: Diagnosis not present

## 2020-03-31 MED ORDER — ENOXAPARIN SODIUM 80 MG/0.8ML ~~LOC~~ SOLN: 80.0000 mg | SUBCUTANEOUS | 1 refills | Status: DC

## 2020-03-31 NOTE — Telephone Encounter (Signed)
Pt presented to front office desk following her [place] today stating that she is not able to get a refill of her Lovenox because insurance is denying it. I advised pt that I will contact her pharmacy for details.  I called [facility] and was told that a new prescription is needed. This was e-prescribed and pt was notified.

## 2020-04-03 ENCOUNTER — Other Ambulatory Visit (HOSPITAL_COMMUNITY)
Admission: RE | Admit: 2020-04-03 | Discharge: 2020-04-03 | Disposition: A | Discharge status: Home / Self Care | Payer: Managed Care, Other (non HMO) | Source: Ambulatory Visit | Attending: Obstetrics and Gynecology | Admitting: Obstetrics and Gynecology

## 2020-04-03 ENCOUNTER — Encounter: Payer: Self-pay | Admitting: Obstetrics and Gynecology

## 2020-04-03 ENCOUNTER — Other Ambulatory Visit: Payer: Self-pay

## 2020-04-03 ENCOUNTER — Ambulatory Visit (INDEPENDENT_AMBULATORY_CARE_PROVIDER_SITE_OTHER): Payer: Medicaid Other | Admitting: Obstetrics and Gynecology

## 2020-04-03 ENCOUNTER — Ambulatory Visit: Payer: Managed Care, Other (non HMO) | Admitting: Physical Therapy

## 2020-04-03 VITALS — BP 112/74 | HR 82 | Wt 179.3 lb

## 2020-04-03 DIAGNOSIS — O0990 Supervision of high risk pregnancy, unspecified, unspecified trimester: Secondary | ICD-10-CM | POA: Diagnosis not present

## 2020-04-03 DIAGNOSIS — B009 Herpesviral infection, unspecified: Secondary | ICD-10-CM

## 2020-04-03 DIAGNOSIS — R8271 Bacteriuria: Secondary | ICD-10-CM

## 2020-04-03 DIAGNOSIS — Z86718 Personal history of other venous thrombosis and embolism: Secondary | ICD-10-CM

## 2020-04-03 MED ORDER — VALACYCLOVIR HCL 500 MG PO TABS: 500.0000 mg | ORAL_TABLET | Freq: Two times a day (BID) | ORAL | 6 refills | Status: DC

## 2020-04-03 NOTE — Patient Instructions (Signed)
Third Trimester of Pregnancy The third trimester is from week 28 through week 40 (months 7 through 9). The third trimester is a time when the unborn baby (fetus) is growing rapidly. At the end of the ninth month, the fetus is about 20 inches in length and weighs 6-10 pounds. Body changes during your third trimester Your body will continue to go through many changes during pregnancy. The changes vary from woman to woman. During the third trimester:  Your weight will continue to increase. You can expect to gain 25-35 pounds (11-16 kg) by the end of the pregnancy.  You may begin to get stretch marks on your hips, abdomen, and breasts.  You may urinate more often because the fetus is moving lower into your pelvis and pressing on your bladder.  You may develop or continue to have heartburn. This is caused by increased hormones that slow down muscles in the digestive tract.  You may develop or continue to have constipation because increased hormones slow digestion and cause the muscles that push waste through your intestines to relax.  You may develop hemorrhoids. These are swollen veins (varicose veins) in the rectum that can itch or be painful.  You may develop swollen, bulging veins (varicose veins) in your legs.  You may have increased body aches in the pelvis, back, or thighs. This is due to weight gain and increased hormones that are relaxing your joints.  You may have changes in your hair. These can include thickening of your hair, rapid growth, and changes in texture. Some women also have hair loss during or after pregnancy, or hair that feels dry or thin. Your hair will most likely return to normal after your baby is born.  Your breasts will continue to grow and they will continue to become tender. A yellow fluid (colostrum) may leak from your breasts. This is the first milk you are producing for your baby.  Your belly button may stick out.  You may notice more swelling in your hands,  face, or ankles.  You may have increased tingling or numbness in your hands, arms, and legs. The skin on your belly may also feel numb.  You may feel short of breath because of your expanding uterus.  You may have more problems sleeping. This can be caused by the size of your belly, increased need to urinate, and an increase in your body's metabolism.  You may notice the fetus "dropping," or moving lower in your abdomen (lightening).  You may have increased vaginal discharge.  You may notice your joints feel loose and you may have pain around your pelvic bone. What to expect at prenatal visits You will have prenatal exams every 2 weeks until week 36. Then you will have weekly prenatal exams. During a routine prenatal visit:  You will be weighed to make sure you and the baby are growing normally.  Your blood pressure will be taken.  Your abdomen will be measured to track your baby's growth.  The fetal heartbeat will be listened to.  Any test results from the previous visit will be discussed.  You may have a cervical check near your due date to see if your cervix has softened or thinned (effaced).  You will be tested for Group B streptococcus. This happens between 35 and 37 weeks. Your health care provider may ask you:  What your birth plan is.  How you are feeling.  If you are feeling the baby move.  If you have had any abnormal   symptoms, such as leaking fluid, bleeding, severe headaches, or abdominal cramping.  If you are using any tobacco products, including cigarettes, chewing tobacco, and electronic cigarettes.  If you have any questions. Other tests or screenings that may be performed during your third trimester include:  Blood tests that check for low iron levels (anemia).  Fetal testing to check the health, activity level, and growth of the fetus. Testing is done if you have certain medical conditions or if there are problems during the pregnancy.  Nonstress test  (NST). This test checks the health of your baby to make sure there are no signs of problems, such as the baby not getting enough oxygen. During this test, a belt is placed around your belly. The baby is made to move, and its heart rate is monitored during movement. What is false labor? False labor is a condition in which you feel small, irregular tightenings of the muscles in the womb (contractions) that usually go away with rest, changing position, or drinking water. These are called Braxton Hicks contractions. Contractions may last for hours, days, or even weeks before true labor sets in. If contractions come at regular intervals, become more frequent, increase in intensity, or become painful, you should see your health care provider. What are the signs of labor?  Abdominal cramps.  Regular contractions that start at 10 minutes apart and become stronger and more frequent with time.  Contractions that start on the top of the uterus and spread down to the lower abdomen and back.  Increased pelvic pressure and dull back pain.  A watery or bloody mucus discharge that comes from the vagina.  Leaking of amniotic fluid. This is also known as your "water breaking." It could be a slow trickle or a gush. Let your health care provider know if it has a color or strange odor. If you have any of these signs, call your health care provider right away, even if it is before your due date. Follow these instructions at home: Medicines  Follow your health care provider's instructions regarding medicine use. Specific medicines may be either safe or unsafe to take during pregnancy.  Take a prenatal vitamin that contains at least 600 micrograms (mcg) of folic acid.  If you develop constipation, try taking a stool softener if your health care provider approves. Eating and drinking   Eat a balanced diet that includes fresh fruits and vegetables, whole grains, good sources of protein such as meat, eggs, or tofu,  and low-fat dairy. Your health care provider will help you determine the amount of weight gain that is right for you.  Avoid raw meat and uncooked cheese. These carry germs that can cause birth defects in the baby.  If you have low calcium intake from food, talk to your health care provider about whether you should take a daily calcium supplement.  Eat four or five small meals rather than three large meals a day.  Limit foods that are high in fat and processed sugars, such as fried and sweet foods.  To prevent constipation: ? Drink enough fluid to keep your urine clear or pale yellow. ? Eat foods that are high in fiber, such as fresh fruits and vegetables, whole grains, and beans. Activity  Exercise only as directed by your health care provider. Most women can continue their usual exercise routine during pregnancy. Try to exercise for 30 minutes at least 5 days a week. Stop exercising if you experience uterine contractions.  Avoid heavy lifting.  Do   not exercise in extreme heat or humidity, or at high altitudes.  Wear low-heel, comfortable shoes.  Practice good posture.  You may continue to have sex unless your health care provider tells you otherwise. Relieving pain and discomfort  Take frequent breaks and rest with your legs elevated if you have leg cramps or low back pain.  Take warm sitz baths to soothe any pain or discomfort caused by hemorrhoids. Use hemorrhoid cream if your health care provider approves.  Wear a good support bra to prevent discomfort from breast tenderness.  If you develop varicose veins: ? Wear support pantyhose or compression stockings as told by your healthcare provider. ? Elevate your feet for 15 minutes, 3-4 times a day. Prenatal care  Write down your questions. Take them to your prenatal visits.  Keep all your prenatal visits as told by your health care provider. This is important. Safety  Wear your seat belt at all times when driving.  Make  a list of emergency phone numbers, including numbers for family, friends, the hospital, and police and fire departments. General instructions  Avoid cat litter boxes and soil used by cats. These carry germs that can cause birth defects in the baby. If you have a cat, ask someone to clean the litter box for you.  Do not travel far distances unless it is absolutely necessary and only with the approval of your health care provider.  Do not use hot tubs, steam rooms, or saunas.  Do not drink alcohol.  Do not use any products that contain nicotine or tobacco, such as cigarettes and e-cigarettes. If you need help quitting, ask your health care provider.  Do not use any medicinal herbs or unprescribed drugs. These chemicals affect the formation and growth of the baby.  Do not douche or use tampons or scented sanitary pads.  Do not cross your legs for long periods of time.  To prepare for the arrival of your baby: ? Take prenatal classes to understand, practice, and ask questions about labor and delivery. ? Make a trial run to the hospital. ? Visit the hospital and tour the maternity area. ? Arrange for maternity or paternity leave through employers. ? Arrange for family and friends to take care of pets while you are in the hospital. ? Purchase a rear-facing car seat and make sure you know how to install it in your car. ? Pack your hospital bag. ? Prepare the baby's nursery. Make sure to remove all pillows and stuffed animals from the baby's crib to prevent suffocation.  Visit your dentist if you have not gone during your pregnancy. Use a soft toothbrush to brush your teeth and be gentle when you floss. Contact a health care provider if:  You are unsure if you are in labor or if your water has broken.  You become dizzy.  You have mild pelvic cramps, pelvic pressure, or nagging pain in your abdominal area.  You have lower back pain.  You have persistent nausea, vomiting, or  diarrhea.  You have an unusual or bad smelling vaginal discharge.  You have pain when you urinate. Get help right away if:  Your water breaks before 37 weeks.  You have regular contractions less than 5 minutes apart before 37 weeks.  You have a fever.  You are leaking fluid from your vagina.  You have spotting or bleeding from your vagina.  You have severe abdominal pain or cramping.  You have rapid weight loss or weight gain.  You have   shortness of breath with chest pain.  You notice sudden or extreme swelling of your face, hands, ankles, feet, or legs.  Your baby makes fewer than 10 movements in 2 hours.  You have severe headaches that do not go away when you take medicine.  You have vision changes. Summary  The third trimester is from week 28 through week 40, months 7 through 9. The third trimester is a time when the unborn baby (fetus) is growing rapidly.  During the third trimester, your discomfort may increase as you and your baby continue to gain weight. You may have abdominal, leg, and back pain, sleeping problems, and an increased need to urinate.  During the third trimester your breasts will keep growing and they will continue to become tender. A yellow fluid (colostrum) may leak from your breasts. This is the first milk you are producing for your baby.  False labor is a condition in which you feel small, irregular tightenings of the muscles in the womb (contractions) that eventually go away. These are called Braxton Hicks contractions. Contractions may last for hours, days, or even weeks before true labor sets in.  Signs of labor can include: abdominal cramps; regular contractions that start at 10 minutes apart and become stronger and more frequent with time; watery or bloody mucus discharge that comes from the vagina; increased pelvic pressure and dull back pain; and leaking of amniotic fluid. This information is not intended to replace advice given to you by your  health care provider. Make sure you discuss any questions you have with your health care provider. Document Revised: 10/12/2018 Document Reviewed: 07/27/2016 Elsevier Patient Education  2020 [employer]  

## 2020-04-03 NOTE — Progress Notes (Signed)
IOL at 39 weeks scheduled for 04/18/20 at [facility].   Fleet Contras RN 04/03/20

## 2020-04-03 NOTE — Progress Notes (Signed)
Subjective:  Olivia Hayes is a 24 y.o. G2P0010 at [redacted]w[redacted]d being seen today for ongoing prenatal care.  She is currently monitored for the following issues for this high-risk pregnancy and has OVERWEIGHT; Multiple allergies; Acne; Globus sensation; Thyroid disorder screening; Tinea corporis; Encounter for wellness examination; Vaccine for diphtheria-tetanus-pertussis, combined; ADHD (attention deficit hyperactivity disorder) evaluation; History of DVT (deep vein thrombosis); Supervision of high risk pregnancy, antepartum; Herpes simplex; Pelvic floor dysfunction; GBS bacteriuria; Supervision of low-risk first pregnancy, third trimester; and Encounter for screening examination for impaired glucose regulation and diabetes mellitus on their problem list.  Patient reports general discomforts of pregnancy.  Contractions: Irritability.  .  Movement: Present. Denies leaking of fluid.   The following portions of the patient's history were reviewed and updated as appropriate: allergies, current medications, past family history, past medical history, past social history, past surgical history and problem list. Problem list updated.  Objective:   Vitals:   04/03/20 1517  BP: 112/74  Pulse: 82  Weight: 179 lb 4.8 oz (81.3 kg)    Fetal Status: Fetal Heart Rate (bpm): 130   Movement: Present     General:  Alert, oriented and cooperative. Patient is in no acute distress.  Skin: Skin is warm and dry. No rash noted.   Cardiovascular: Normal heart rate noted  Respiratory: Normal respiratory effort, no problems with respiration noted  Abdomen: Soft, gravid, appropriate for gestational age. Pain/Pressure: Present     Pelvic:  Cervical exam performed        Extremities: Normal range of motion.  Edema: None  Mental Status: Normal mood and affect. Normal behavior. Normal judgment and thought content.   Urinalysis:      Assessment and Plan:  Pregnancy: G2P0010 at [redacted]w[redacted]d  1. Supervision of high risk pregnancy,  antepartum Labor precautions - GC/Chlamydia probe amp ([place])not at [facility]  2. GBS bacteriuria Tx while in labor  3. History of DVT (deep vein thrombosis) Stable continue with Lovenox IOL 39-40 weeks schedule  4. Herpes simplex  - valACYclovir (VALTREX) 500 MG tablet; Take 1 tablet (500 mg total) by mouth 2 (two) times daily.  Dispense: 60 tablet; Refill: 6  Term labor symptoms and general obstetric precautions including but not limited to vaginal bleeding, contractions, leaking of fluid and fetal movement were reviewed in detail with the patient. Please refer to After Visit Summary for other counseling recommendations.  Return in about 1 week (around 04/10/2020) for OB visit, face to face, MD only.   Hermina Staggers, MD

## 2020-04-04 ENCOUNTER — Ambulatory Visit: Payer: Medicaid Other | Admitting: Physical Therapy

## 2020-04-04 LAB — GC/CHLAMYDIA PROBE AMP (~~LOC~~) NOT AT ARMC
Chlamydia: NEGATIVE
Comment: NEGATIVE
Comment: NORMAL
Neisseria Gonorrhea: NEGATIVE

## 2020-04-09 ENCOUNTER — Telehealth (HOSPITAL_COMMUNITY): Payer: Self-pay | Admitting: *Deleted

## 2020-04-09 NOTE — Telephone Encounter (Signed)
Preadmission screen  

## 2020-04-10 ENCOUNTER — Telehealth (HOSPITAL_COMMUNITY): Payer: Self-pay | Admitting: *Deleted

## 2020-04-10 ENCOUNTER — Other Ambulatory Visit: Payer: Self-pay | Admitting: Advanced Practice Midwife

## 2020-04-10 NOTE — Telephone Encounter (Signed)
Preadmission screen  

## 2020-04-11 ENCOUNTER — Encounter: Payer: Self-pay | Admitting: Obstetrics and Gynecology

## 2020-04-11 ENCOUNTER — Other Ambulatory Visit: Payer: Self-pay

## 2020-04-11 ENCOUNTER — Ambulatory Visit (INDEPENDENT_AMBULATORY_CARE_PROVIDER_SITE_OTHER): Payer: Medicaid Other | Admitting: Obstetrics and Gynecology

## 2020-04-11 VITALS — BP 104/75 | HR 78 | Wt 182.0 lb

## 2020-04-11 DIAGNOSIS — O3663X0 Maternal care for excessive fetal growth, third trimester, not applicable or unspecified: Secondary | ICD-10-CM

## 2020-04-11 DIAGNOSIS — Z86718 Personal history of other venous thrombosis and embolism: Secondary | ICD-10-CM

## 2020-04-11 DIAGNOSIS — O3660X0 Maternal care for excessive fetal growth, unspecified trimester, not applicable or unspecified: Secondary | ICD-10-CM | POA: Insufficient documentation

## 2020-04-11 DIAGNOSIS — R8271 Bacteriuria: Secondary | ICD-10-CM

## 2020-04-11 DIAGNOSIS — B009 Herpesviral infection, unspecified: Secondary | ICD-10-CM

## 2020-04-11 DIAGNOSIS — Z3A38 38 weeks gestation of pregnancy: Secondary | ICD-10-CM

## 2020-04-11 DIAGNOSIS — O0990 Supervision of high risk pregnancy, unspecified, unspecified trimester: Secondary | ICD-10-CM

## 2020-04-11 NOTE — Patient Instructions (Signed)
Come in to the hospital at 1145 PM on Thursday, October 14th Take your last dose of lovenox on Wednesday, October 13th.

## 2020-04-11 NOTE — Progress Notes (Signed)
Prenatal Visit Note Date: 04/11/2020 Clinic: [facility]  Subjective:  Olivia Hayes is a 24 y.o. G2P0010 at [redacted]w[redacted]d being seen today for ongoing prenatal care.  She is currently monitored for the following issues for this high-risk pregnancy and has Multiple allergies; Acne; Globus sensation; Encounter for wellness examination; ADHD (attention deficit hyperactivity disorder) evaluation; History of DVT (deep vein thrombosis); Supervision of high risk pregnancy, antepartum; Herpes simplex; Pelvic floor dysfunction; GBS bacteriuria; Encounter for screening examination for impaired glucose regulation and diabetes mellitus; and LGA (large for gestational age) fetus affecting management of mother on their problem list.  Patient reports no complaints.   Contractions: Irritability. Vag. Bleeding: None.  Movement: Present. Denies leaking of fluid.   The following portions of the patient's history were reviewed and updated as appropriate: allergies, current medications, past family history, past medical history, past social history, past surgical history and problem list. Problem list updated.  Objective:   Vitals:   04/11/20 0825  BP: 104/75  Pulse: 78  Weight: 182 lb (82.6 kg)    Fetal Status: Fetal Heart Rate (bpm): 134 Fundal Height: 36 cm Movement: Present  Presentation: Vertex  General:  Alert, oriented and cooperative. Patient is in no acute distress.  Skin: Skin is warm and dry. No rash noted.   Cardiovascular: Normal heart rate noted  Respiratory: Normal respiratory effort, no problems with respiration noted  Abdomen: Soft, gravid, appropriate for gestational age. Pain/Pressure: Present     Pelvic:  Cervical exam performed Dilation: 1.5 Effacement (%): 50 Station: Ballotable  Extremities: Normal range of motion.  Edema: None  Mental Status: Normal mood and affect. Normal behavior. Normal judgment and thought content.   Urinalysis:      Assessment and Plan:   Pregnancy: G2P0010 at [redacted]w[redacted]d  1. History of DVT (deep vein thrombosis) Continue lovenox ppx. Pt for IOL on 10/14 at 2345. I d/w her to take her last lovenox dose on 10/13 and will need to be on for 6wks PP.   2. Excessive fetal growth affecting management of pregnancy in third trimester, single or unspecified fetus Per 9/27. Pelvis feels adequate. H/o normal 2h GTT  3. Herpes simplex Continue ppx valtrex  4. GBS bacteriuria tx in labor  5. Supervision of high risk pregnancy, antepartum  Term labor symptoms and general obstetric precautions including but not limited to vaginal bleeding, contractions, leaking of fluid and fetal movement were reviewed in detail with the patient. Please refer to After Visit Summary for other counseling recommendations.  Return if symptoms worsen or fail to improve.   [place] Bing, MD

## 2020-04-13 ENCOUNTER — Other Ambulatory Visit: Payer: Self-pay | Admitting: Family Medicine

## 2020-04-14 ENCOUNTER — Telehealth (HOSPITAL_COMMUNITY): Payer: Self-pay | Admitting: *Deleted

## 2020-04-14 ENCOUNTER — Encounter (HOSPITAL_COMMUNITY): Payer: Self-pay | Admitting: *Deleted

## 2020-04-14 NOTE — Telephone Encounter (Signed)
Preadmission screen  

## 2020-04-16 ENCOUNTER — Other Ambulatory Visit (HOSPITAL_COMMUNITY)
Admission: RE | Admit: 2020-04-16 | Discharge: 2020-04-16 | Disposition: A | Discharge status: Home / Self Care | Payer: Managed Care, Other (non HMO) | Source: Ambulatory Visit | Attending: Family Medicine | Admitting: Family Medicine

## 2020-04-16 DIAGNOSIS — Z01812 Encounter for preprocedural laboratory examination: Secondary | ICD-10-CM | POA: Insufficient documentation

## 2020-04-16 DIAGNOSIS — Z20822 Contact with and (suspected) exposure to covid-19: Secondary | ICD-10-CM | POA: Diagnosis not present

## 2020-04-16 LAB — SARS CORONAVIRUS 2 (TAT 6-24 HRS): SARS Coronavirus 2: NEGATIVE

## 2020-04-18 ENCOUNTER — Telehealth: Payer: Self-pay | Admitting: *Deleted

## 2020-04-18 ENCOUNTER — Other Ambulatory Visit: Payer: Self-pay | Admitting: Advanced Practice Midwife

## 2020-04-18 ENCOUNTER — Inpatient Hospital Stay (HOSPITAL_COMMUNITY): Payer: Managed Care, Other (non HMO)

## 2020-04-18 NOTE — Telephone Encounter (Signed)
Called pt and informed her of updated plan of care. Dr. Adrian Blackwater would like her to administer her Lovenox now. She should not take Lovenox tomorrow. Her induction of labor will be deferred to tomorrow and she may eat all meals and snacks as usual today. She may have a light breakfast tomorrow and will be called to come to hospital for IOL when it is her turn. Pt voiced understanding of instructions and information given.

## 2020-04-19 ENCOUNTER — Inpatient Hospital Stay (HOSPITAL_COMMUNITY): Payer: Managed Care, Other (non HMO)

## 2020-04-19 ENCOUNTER — Other Ambulatory Visit: Payer: Self-pay

## 2020-04-19 ENCOUNTER — Encounter (HOSPITAL_COMMUNITY): Payer: Self-pay | Admitting: Obstetrics and Gynecology

## 2020-04-19 ENCOUNTER — Inpatient Hospital Stay (HOSPITAL_COMMUNITY)
Admission: AD | Admit: 2020-04-19 | Discharge: 2020-04-22 | DRG: 806 | Disposition: A | Discharge status: Home / Self Care | Payer: Managed Care, Other (non HMO) | Attending: Family Medicine | Admitting: Family Medicine

## 2020-04-19 DIAGNOSIS — O0990 Supervision of high risk pregnancy, unspecified, unspecified trimester: Secondary | ICD-10-CM

## 2020-04-19 DIAGNOSIS — Z86718 Personal history of other venous thrombosis and embolism: Secondary | ICD-10-CM

## 2020-04-19 DIAGNOSIS — O9832 Other infections with a predominantly sexual mode of transmission complicating childbirth: Secondary | ICD-10-CM | POA: Diagnosis present

## 2020-04-19 DIAGNOSIS — M6289 Other specified disorders of muscle: Secondary | ICD-10-CM

## 2020-04-19 DIAGNOSIS — Z3A39 39 weeks gestation of pregnancy: Secondary | ICD-10-CM

## 2020-04-19 DIAGNOSIS — O3663X0 Maternal care for excessive fetal growth, third trimester, not applicable or unspecified: Secondary | ICD-10-CM | POA: Diagnosis present

## 2020-04-19 DIAGNOSIS — Z7901 Long term (current) use of anticoagulants: Secondary | ICD-10-CM | POA: Diagnosis not present

## 2020-04-19 DIAGNOSIS — O99824 Streptococcus B carrier state complicating childbirth: Secondary | ICD-10-CM | POA: Diagnosis present

## 2020-04-19 DIAGNOSIS — R8271 Bacteriuria: Secondary | ICD-10-CM

## 2020-04-19 DIAGNOSIS — A6000 Herpesviral infection of urogenital system, unspecified: Secondary | ICD-10-CM | POA: Diagnosis present

## 2020-04-19 LAB — CBC
HCT: 40.6 % (ref 36.0–46.0)
Hemoglobin: 13.2 g/dL (ref 12.0–15.0)
MCH: 29 pg (ref 26.0–34.0)
MCHC: 32.5 g/dL (ref 30.0–36.0)
MCV: 89.2 fL (ref 80.0–100.0)
Platelets: 178 10*3/uL (ref 150–400)
RBC: 4.55 MIL/uL (ref 3.87–5.11)
RDW: 14.2 % (ref 11.5–15.5)
WBC: 10.5 10*3/uL (ref 4.0–10.5)
nRBC: 0 % (ref 0.0–0.2)

## 2020-04-19 LAB — TYPE AND SCREEN
ABO/RH(D): A POS
Antibody Screen: NEGATIVE

## 2020-04-19 LAB — PROTIME-INR
INR: 0.9 (ref 0.8–1.2)
Prothrombin Time: 12 seconds (ref 11.4–15.2)

## 2020-04-19 LAB — APTT: aPTT: 26 seconds (ref 24–36)

## 2020-04-19 MED ORDER — MISOPROSTOL 25 MCG QUARTER TABLET
ORAL_TABLET | ORAL | Status: AC
  Administered 2020-04-19: 25 ug via VAGINAL
  Filled 2020-04-19: qty 1

## 2020-04-19 MED ORDER — MISOPROSTOL 50MCG HALF TABLET
50.0000 ug | ORAL_TABLET | ORAL | Status: DC | PRN
  Administered 2020-04-19: 50 ug via BUCCAL

## 2020-04-19 MED ORDER — SODIUM CHLORIDE 0.9 % IV SOLN
5.0000 10*6.[IU] | Freq: Once | INTRAVENOUS | Status: AC
  Administered 2020-04-19: 5 10*6.[IU] via INTRAVENOUS
  Filled 2020-04-19: qty 5

## 2020-04-19 MED ORDER — OXYTOCIN-SODIUM CHLORIDE 30-0.9 UT/500ML-% IV SOLN
2.5000 [IU]/h | INTRAVENOUS | Status: DC
  Administered 2020-04-20: 2.5 [IU]/h via INTRAVENOUS
  Filled 2020-04-19: qty 500

## 2020-04-19 MED ORDER — TERBUTALINE SULFATE 1 MG/ML IJ SOLN
0.2500 mg | Freq: Once | INTRAMUSCULAR | Status: AC | PRN
  Administered 2020-04-20: 0.25 mg via SUBCUTANEOUS
  Filled 2020-04-19: qty 1

## 2020-04-19 MED ORDER — MISOPROSTOL 100 MCG PO TABS: 25.0000 ug | ORAL_TABLET | ORAL | Status: DC

## 2020-04-19 MED ORDER — FENTANYL CITRATE (PF) 100 MCG/2ML IJ SOLN
50.0000 ug | INTRAMUSCULAR | Status: AC | PRN
  Administered 2020-04-19 (×3): 50 ug via INTRAVENOUS
  Filled 2020-04-19 (×3): qty 2

## 2020-04-19 MED ORDER — SOD CITRATE-CITRIC ACID 500-334 MG/5ML PO SOLN: 30.0000 mL | ORAL | Status: DC | PRN

## 2020-04-19 MED ORDER — LACTATED RINGERS IV SOLN
500.0000 mL | INTRAVENOUS | Status: DC | PRN
  Administered 2020-04-20 (×2): 500 mL via INTRAVENOUS

## 2020-04-19 MED ORDER — LACTATED RINGERS IV SOLN: INTRAVENOUS | Status: DC

## 2020-04-19 MED ORDER — MISOPROSTOL 25 MCG QUARTER TABLET
25.0000 ug | ORAL_TABLET | ORAL | Status: DC | PRN
  Filled 2020-04-19: qty 1

## 2020-04-19 MED ORDER — ACETAMINOPHEN 325 MG PO TABS: 650.0000 mg | ORAL_TABLET | ORAL | Status: DC | PRN

## 2020-04-19 MED ORDER — ONDANSETRON HCL 4 MG/2ML IJ SOLN: 4.0000 mg | Freq: Four times a day (QID) | INTRAMUSCULAR | Status: DC | PRN

## 2020-04-19 MED ORDER — LIDOCAINE HCL (PF) 1 % IJ SOLN
30.0000 mL | INTRAMUSCULAR | Status: AC | PRN
  Administered 2020-04-20: 30 mL via SUBCUTANEOUS
  Filled 2020-04-19: qty 30

## 2020-04-19 MED ORDER — OXYTOCIN BOLUS FROM INFUSION
333.0000 mL | Freq: Once | INTRAVENOUS | Status: AC
  Administered 2020-04-20: 333 mL via INTRAVENOUS

## 2020-04-19 MED ORDER — PENICILLIN G POT IN DEXTROSE 60000 UNIT/ML IV SOLN
3.0000 10*6.[IU] | INTRAVENOUS | Status: DC
  Administered 2020-04-19 – 2020-04-20 (×4): 3 10*6.[IU] via INTRAVENOUS
  Filled 2020-04-19 (×4): qty 50

## 2020-04-19 MED ORDER — MISOPROSTOL 50MCG HALF TABLET
ORAL_TABLET | ORAL | Status: AC
  Filled 2020-04-19: qty 1

## 2020-04-19 NOTE — Progress Notes (Signed)
Labor Progress Note  Subjective:  Doing well. Feeling cramping.   Objective:  BP (!) 103/57 (BP Location: Right Arm)   Pulse 77   Temp 98.2 F (36.8 C) (Oral)   Resp 18   Ht 5\' 5"  (1.651 m)   Wt 82.6 kg   LMP 07/20/2019 (Exact Date)   BMI 30.29 kg/m  Cervical: posterior, 4/20/ballotable. Vertex.  Toco: q2-4 FH: BL 145, moderate var, + a, variable decel x1 with return to baseline.   Assessment and Plan:  Olivia Hayes is a 24 y.o. G2P0010 at [redacted]w[redacted]d admitted for IOL for LGA (EFW 4.1kg). Hx of DVT on lovenox (last dose 04/18/20)  Labor:  S/p FB. Cytotec x 2 2200. 04/20/20 Pain control: per pt preference . Anticipated MOD: NSVD  Fetal Wellbeing: Category I . GBS positive, PCN  . Continuous fetal monitoring  Marland Kitchen, M.D.  FM PGY 3 04/19/2020 10:36 PM

## 2020-04-19 NOTE — H&P (Addendum)
OBSTETRIC ADMISSION HISTORY AND PHYSICAL  Olivia Hayes is a 24 y.o. female G2P0010 with IUP at [redacted]w[redacted]d by LMP presenting for IOL for LGA. She reports +FMs, No LOF, no VB, no blurry vision, headaches or peripheral edema, and RUQ pain.  She plans on breast feeding. She is undecided about birth control. 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    Pelvic floor dysfunction    Yeast infection     Past Surgical History: Past Surgical History:  Procedure Laterality Date   ABDOMINAL HERNIA REPAIR  age 81   INDUCED ABORTION      Obstetrical History: OB History     Gravida  2   Para  0   Term  0   Preterm  0   AB  1   Living  0      SAB  0   TAB  1   Ectopic  0   Multiple  0   Live Births  0           Social History Social History   Socioeconomic History   Marital status: Married    Spouse name: Not on file   Number of children: Not on file   Years of education: Not on file   Highest education level: Not on file  Occupational History   Not on file  Tobacco Use   Smoking status: Never Smoker   Smokeless tobacco: Never Used  Vaping Use   Vaping Use: Never used  Substance and Sexual Activity   Alcohol use: Not Currently    Comment: Social   Drug use: Not Currently    Types: Marijuana    Comment: last smoked beginning February 2021   Sexual activity: Yes    Birth control/protection: None    Comment:  intercourse age 67, less than 5 sexua partners  Other Topics Concern   Not on file  Social History Narrative   Not on file   Social Determinants of Health   Financial Resource Strain:    Difficulty of Paying Living Expenses: Not on file  Food Insecurity: Food Insecurity Present   Worried About Running Out of Food in the Last Year: Sometimes true   Ran Out of Food in the Last Year: Sometimes true  Transportation Needs: No Transportation Needs   Lack of Transportation (Medical): No   Lack of Transportation (Non-Medical): No  Physical Activity:    Days of Exercise per Week: Not on file   Minutes of Exercise per Session: Not on file  Stress:    Feeling of Stress : Not on file  Social Connections:    Frequency of Communication with Friends and Family: Not on file   Frequency of Social Gatherings with Friends and Family: Not on file   Attends Religious Services: Not on file   Active Member of Clubs or Organizations: Not on file   Attends Club or  Organization Meetings: Not on file   Marital Status: Not on file    Family History: Family History  Problem Relation Age of Onset   Thyroid disease Mother     Allergies: Allergies  Allergen Reactions   Peanuts [Peanut Oil] Anaphylaxis and Hives   Ibuprofen Other (See Comments)    Blood CLot- cannot take while on blood thinners for DVT    No medications prior to admission.   Review of Systems   All systems reviewed and negative except as stated in HPI  Last menstrual period 07/20/2019. General appearance: alert, cooperative and no distress Lungs: clear to auscultation bilaterally Heart: regular rate and rhythm Abdomen: gravid Pelvic: see below Extremities: No sign of DVT Presentation: cephalic by US Fetal monitoringBaseline: 130 bpm, Variability: Good {> 6 bpm), Accelerations: Reactive and Decelerations: Absent Uterine activity: irreg, spaced out on toco   Prenatal labs: ABO, Rh: A/Positive/-- (04/07 1042) Antibody:  Negative (04/07 1042) Rubella: <0.90 (04/07 1042) RPR: Non Reactive (07/27 0934)  HBsAg: Negative (04/07 1042)  HIV: Non Reactive (07/27 0934)  GBS:   positive 2 hr Glucola: 72/128/71 Genetic screening: NIPS low risk, horizon neg Anatomy US: normal anatomy, LGA  Prenatal Transfer Tool  Maternal Diabetes: No Genetic Screening: Normal Maternal Ultrasounds/Referrals: Normal Fetal Ultrasounds or other Referrals: LGA (92% on growth scan) None Maternal Substance Abuse:  No Significant Maternal Medications:  None Significant Maternal Lab Results: None  No results found for this or any previous visit (from the past 24 hour(s)).  Patient Active Problem List   Diagnosis Date Noted   LGA (large for gestational age) fetus affecting management of mother 04/11/2020   Encounter for screening examination for impaired glucose regulation and diabetes mellitus 01/29/2020   GBS bacteriuria 09/28/2019   Supervision of high risk pregnancy, antepartum 09/27/2019   Pelvic floor dysfunction    History of DVT (deep vein thrombosis) 08/24/2019   Herpes simplex 10/15/2018   Encounter for wellness examination 01/06/2018   ADHD (attention deficit hyperactivity disorder) evaluation 01/06/2018   Globus sensation 07/11/2015   Acne 03/28/2013   Multiple allergies 02/21/2013    Assessment/Plan:  Olivia Hayes is a 24 y.o. G2P0010 at [redacted]w[redacted]d here for IOL for LGA.  #Labor: Will start with FB insertion and cytotec. #LGA: 92%, 4100 g (9 lb) estimated fetal weight @[redacted]w[redacted]d  using fetal biometry calc #Pain: DO NOT GIVE PATIENT IBUPROFEN (ON ANTICOAG), per request #HSV: denies recent outbreaks, no outbreaks on speculum exam, on valtrex #hx of DVT: has been AC'ed on Lovenox, has not taken today, will monitor VS for signs of PE #FWB: Cat 1 #ID: positive (PCN) #MOF: breast #MOC: undecided, will provide resources #Circ: N/A  MD, PGY-1  Attestation of Supervision of Student:  I confirm that I have  verified the information documented in the resident's note and that I have also personally reperformed the history, physical exam and all medical decision making activities.  I have verified that all services and findings are accurately documented in this student's note; and I agree with management and plan as outlined in the documentation. I have also made any necessary editorial changes.    Herby Abraham, CNM [facility], [facility] 04/19/2020 7:45 PM

## 2020-04-20 ENCOUNTER — Inpatient Hospital Stay (HOSPITAL_COMMUNITY): Payer: Managed Care, Other (non HMO) | Admitting: Anesthesiology

## 2020-04-20 ENCOUNTER — Encounter (HOSPITAL_COMMUNITY): Payer: Self-pay | Admitting: Obstetrics and Gynecology

## 2020-04-20 DIAGNOSIS — Z3A39 39 weeks gestation of pregnancy: Secondary | ICD-10-CM

## 2020-04-20 DIAGNOSIS — O3663X0 Maternal care for excessive fetal growth, third trimester, not applicable or unspecified: Secondary | ICD-10-CM

## 2020-04-20 DIAGNOSIS — O99824 Streptococcus B carrier state complicating childbirth: Secondary | ICD-10-CM

## 2020-04-20 DIAGNOSIS — Z86718 Personal history of other venous thrombosis and embolism: Secondary | ICD-10-CM

## 2020-04-20 LAB — RPR: RPR Ser Ql: NONREACTIVE

## 2020-04-20 MED ORDER — SENNOSIDES-DOCUSATE SODIUM 8.6-50 MG PO TABS
2.0000 | ORAL_TABLET | ORAL | Status: DC
  Administered 2020-04-20 – 2020-04-21 (×2): 2 via ORAL
  Filled 2020-04-20 (×2): qty 2

## 2020-04-20 MED ORDER — DIPHENHYDRAMINE HCL 25 MG PO CAPS: 25.0000 mg | ORAL_CAPSULE | Freq: Four times a day (QID) | ORAL | Status: DC | PRN

## 2020-04-20 MED ORDER — PRENATAL MULTIVITAMIN CH
1.0000 | ORAL_TABLET | Freq: Every day | ORAL | Status: DC
  Administered 2020-04-21 – 2020-04-22 (×2): 1 via ORAL
  Filled 2020-04-20 (×2): qty 1

## 2020-04-20 MED ORDER — FENTANYL-BUPIVACAINE-NACL 0.5-0.125-0.9 MG/250ML-% EP SOLN: 12.0000 mL/h | EPIDURAL | Status: DC | PRN

## 2020-04-20 MED ORDER — TETANUS-DIPHTH-ACELL PERTUSSIS 5-2.5-18.5 LF-MCG/0.5 IM SUSP: 0.5000 mL | Freq: Once | INTRAMUSCULAR | Status: DC

## 2020-04-20 MED ORDER — DIPHENHYDRAMINE HCL 50 MG/ML IJ SOLN: 12.5000 mg | INTRAMUSCULAR | Status: DC | PRN

## 2020-04-20 MED ORDER — EPHEDRINE 5 MG/ML INJ: 10.0000 mg | INTRAVENOUS | Status: DC | PRN

## 2020-04-20 MED ORDER — SIMETHICONE 80 MG PO CHEW: 80.0000 mg | CHEWABLE_TABLET | ORAL | Status: DC | PRN

## 2020-04-20 MED ORDER — BENZOCAINE-MENTHOL 20-0.5 % EX AERO
1.0000 "application " | INHALATION_SPRAY | CUTANEOUS | Status: DC | PRN
  Administered 2020-04-20: 1 via TOPICAL
  Filled 2020-04-20: qty 56

## 2020-04-20 MED ORDER — SODIUM CHLORIDE (PF) 0.9 % IJ SOLN
INTRAMUSCULAR | Status: DC | PRN
Start: 2020-04-20 — End: 2020-04-20
  Administered 2020-04-20: 12 mL/h via EPIDURAL

## 2020-04-20 MED ORDER — ONDANSETRON HCL 4 MG PO TABS: 4.0000 mg | ORAL_TABLET | ORAL | Status: DC | PRN

## 2020-04-20 MED ORDER — OXYCODONE HCL 5 MG PO TABS
5.0000 mg | ORAL_TABLET | ORAL | Status: DC | PRN
  Administered 2020-04-20 – 2020-04-22 (×7): 5 mg via ORAL
  Filled 2020-04-20 (×7): qty 1

## 2020-04-20 MED ORDER — FENTANYL CITRATE (PF) 100 MCG/2ML IJ SOLN
100.0000 ug | INTRAMUSCULAR | Status: DC | PRN
  Administered 2020-04-20: 100 ug via INTRAVENOUS
  Filled 2020-04-20: qty 2

## 2020-04-20 MED ORDER — DIBUCAINE (PERIANAL) 1 % EX OINT: 1.0000 "application " | TOPICAL_OINTMENT | CUTANEOUS | Status: DC | PRN

## 2020-04-20 MED ORDER — ACETAMINOPHEN 325 MG PO TABS
650.0000 mg | ORAL_TABLET | Freq: Four times a day (QID) | ORAL | Status: DC
  Administered 2020-04-20 – 2020-04-22 (×7): 650 mg via ORAL
  Filled 2020-04-20 (×8): qty 2

## 2020-04-20 MED ORDER — LIDOCAINE HCL (PF) 1 % IJ SOLN
INTRAMUSCULAR | Status: DC | PRN
  Administered 2020-04-20: 5 mL via EPIDURAL

## 2020-04-20 MED ORDER — PHENYLEPHRINE 40 MCG/ML (10ML) SYRINGE FOR IV PUSH (FOR BLOOD PRESSURE SUPPORT)
80.0000 ug | PREFILLED_SYRINGE | INTRAVENOUS | Status: DC | PRN
  Filled 2020-04-20: qty 10

## 2020-04-20 MED ORDER — PHENYLEPHRINE 40 MCG/ML (10ML) SYRINGE FOR IV PUSH (FOR BLOOD PRESSURE SUPPORT)
80.0000 ug | PREFILLED_SYRINGE | INTRAVENOUS | Status: DC | PRN
  Administered 2020-04-20 (×2): 80 ug via INTRAVENOUS

## 2020-04-20 MED ORDER — COCONUT OIL OIL: 1.0000 "application " | TOPICAL_OIL | Status: DC | PRN

## 2020-04-20 MED ORDER — ONDANSETRON HCL 4 MG/2ML IJ SOLN: 4.0000 mg | INTRAMUSCULAR | Status: DC | PRN

## 2020-04-20 MED ORDER — WITCH HAZEL-GLYCERIN EX PADS: 1.0000 "application " | MEDICATED_PAD | CUTANEOUS | Status: DC | PRN

## 2020-04-20 MED ORDER — FENTANYL-BUPIVACAINE-NACL 0.5-0.125-0.9 MG/250ML-% EP SOLN
EPIDURAL | Status: AC
  Filled 2020-04-20: qty 250

## 2020-04-20 MED ORDER — LACTATED RINGERS IV SOLN
500.0000 mL | Freq: Once | INTRAVENOUS | Status: AC
  Administered 2020-04-20: 500 mL via INTRAVENOUS

## 2020-04-20 NOTE — Anesthesia Preprocedure Evaluation (Signed)
Anesthesia Evaluation  Patient identified by MRN, date of birth, ID band Patient awake    Reviewed: Allergy & Precautions, NPO status , Patient's Chart, lab work & pertinent test results  Airway Mallampati: II  TM Distance: >3 FB Neck ROM: Full    Dental no notable dental hx. (+) Teeth Intact, Dental Advisory Given   Pulmonary asthma ,    Pulmonary exam normal breath sounds clear to auscultation       Cardiovascular Exercise Tolerance: Good negative cardio ROS Normal cardiovascular exam Rhythm:Regular Rate:Normal  Hx of DVT in Past off lovenox 10/15   Neuro/Psych negative neurological ROS  negative psych ROS   GI/Hepatic negative GI ROS, Neg liver ROS,   Endo/Other  negative endocrine ROS  Renal/GU negative Renal ROS     Musculoskeletal   Abdominal   Peds  Hematology Hgb 13.2 Plt 178   Anesthesia Other Findings   Reproductive/Obstetrics (+) Pregnancy                             Anesthesia Physical Anesthesia Plan  ASA: II  Anesthesia Plan: Epidural   Post-op Pain Management:    Induction:   PONV Risk Score and Plan:   Airway Management Planned:   Additional Equipment:   Intra-op Plan:   Post-operative Plan:   Informed Consent: I have reviewed the patients History and Physical, chart, labs and discussed the procedure including the risks, benefits and alternatives for the proposed anesthesia with the patient or authorized representative who has indicated his/her understanding and acceptance.       Plan Discussed with:   Anesthesia Plan Comments: (39.2 wk G2P0 for LEA)        Anesthesia Quick Evaluation

## 2020-04-20 NOTE — Progress Notes (Signed)
Labor Progress Note  Subjective:  More frequent and stronger contractions with moderate distress.   Objective:  BP 107/62   Pulse 73   Temp 98.9 F (37.2 C) (Oral)   Resp 20   Ht 5\' 5"  (1.651 m)   Wt 82.6 kg   LMP 07/20/2019 (Exact Date)   BMI 30.29 kg/m  Cervical: posterior, 7/80/-2, vertex.  Toco: 1-3 FH: BL 135, moderate var, + a, No decel    Assessment and Plan:  Olivia Hayes is a 24 y.o. G2P0010 at [redacted]w[redacted]d admitted for IOL for LGA (EFW 4.1kg). Hx of DVT on lovenox (last dose 04/18/20)  Labor:  S/p FB. Cytotec x 2 2200. Patient with good cervical change to 7/80/-2 with strong contractions more frequently. Will hold on pitocin at this time.  . Pain control: per pt preference, considering epidural.  . Anticipated MOD: NSVD  Fetal Wellbeing: Category I . GBS positive, PCN  . Continuous fetal monitoring  10-11-1975, M.D.  FM PGY 3 04/20/2020 2:14 AM

## 2020-04-20 NOTE — Discharge Summary (Addendum)
Postpartum Discharge Summary  Date of Service updated 04/22/20    Patient Name: Olivia Hayes DOB: 12-23-1995 MRN: [id number]  Date of admission: 04/19/2020 Delivery date:04/20/2020  Delivering provider: Randa Ngo  Date of discharge: 04/22/2020  Admitting diagnosis: Supervision of high risk pregnancy, antepartum [O09.90] History of venous thromboembolism [Z86.718] Intrauterine pregnancy: [redacted]w[redacted]d    Secondary diagnosis:  Principal Problem:   Vaginal delivery Active Problems:   Supervision of high risk pregnancy, antepartum   GBS bacteriuria   History of venous thromboembolism  Additional problems: as noted above    Discharge diagnosis:  Vaginal delivery                                          Post partum procedures: none Augmentation: Cytotec and IP Foley Complications: None  Hospital course: Induction of Labor With Vaginal Delivery   24y.o. yo G2P1011 at 338w2das admitted to the hospital 04/19/2020 for induction of labor.  Indication for induction:  suspected LGA infant on ultrasound, h/o DVT (on lovenox 8065maily in pregnancy) .  Patient had an uncomplicated labor course as follows: Membrane Rupture Time/Date: 10:40 AM ,04/20/2020   Delivery Method:Vaginal, Spontaneous  Episiotomy:  none Lacerations:  Periurethral;2nd degree  Details of delivery can be found in separate delivery note.  Patient had a routine postpartum course. Patient is discharged home 04/22/20.  Newborn Data: Birth date:04/20/2020  Birth time:3:26 PM  Gender:Female  Living status:Living  Apgars:8 ,9  Weight:3220 g   Magnesium Sulfate received: No BMZ received: No Rhophylac:N/A MMR:No Plan to give on discharge Rubella non-immune T-DaP:Given prenatally Flu: No declined prenatally Transfusion:No  Physical exam  Vitals:   04/21/20 0852 04/21/20 1257 04/21/20 2100 04/22/20 0600  BP: 115/70 (!) 95/52 113/66 107/81  Pulse: 71 70 75 67  Resp: '18 16 18 17  ' Temp: 97.9 F (36.6 C)  98.6 F  (37 C) 98.1 F (36.7 C)  TempSrc: Oral  Oral Oral  SpO2: 98% 99% 98% 97%  Weight:      Height:       General: alert Lochia: appropriate Uterine Fundus: firm Incision: N/A DVT Evaluation: No evidence of DVT seen on physical exam. Labs: Lab Results  Component Value Date   WBC 10.5 04/19/2020   HGB 13.2 04/19/2020   HCT 40.6 04/19/2020   MCV 89.2 04/19/2020   PLT 178 04/19/2020   CMP Latest Ref Rng & Units 09/26/2019  Glucose 70 - 99 mg/dL 86  BUN 6 - 20 mg/dL 8  Creatinine 0.44 - 1.00 mg/dL 0.58  Sodium 135 - 145 mmol/L 136  Potassium 3.5 - 5.1 mmol/L 3.8  Chloride 98 - 111 mmol/L 105  CO2 22 - 32 mmol/L 21(L)  Calcium 8.9 - 10.3 mg/dL 8.7(L)  Total Protein 6.5 - 8.1 g/dL 6.9  Total Bilirubin 0.3 - 1.2 mg/dL 0.3  Alkaline Phos 38 - 126 U/L 30(L)  AST 15 - 41 U/L 16  ALT 0 - 44 U/L 24   Edinburgh Score: Edinburgh Postnatal Depression Scale Screening Tool 04/21/2020  I have been able to laugh and see the funny side of things. 0  I have looked forward with enjoyment to things. 0  I have blamed myself unnecessarily when things went wrong. 1  I have been anxious or worried for no good reason. 2  I have felt scared or panicky for no good reason. 2  Things  have been getting on top of me. 2  I have been so unhappy that I have had difficulty sleeping. 1  I have felt sad or miserable. 0  I have been so unhappy that I have been crying. 1  The thought of harming myself has occurred to me. 0  Edinburgh Postnatal Depression Scale Total 9     After visit meds:  Allergies as of 04/22/2020       Reactions   Peanuts [peanut Oil] Anaphylaxis, Hives        Medication List     STOP taking these medications    valACYclovir 500 MG tablet Commonly known as: VALTREX       TAKE these medications    acetaminophen 325 MG tablet Commonly known as: Tylenol Take 2 tablets (650 mg total) by mouth every 6 (six) hours as needed. What changed:  medication strength how much  to take when to take this reasons to take this   cetirizine 5 MG tablet Commonly known as: ZYRTEC Take 1 tablet (5 mg total) by mouth daily.   coconut oil Oil Apply 1 application topically as needed.   enoxaparin 80 MG/0.8ML injection Commonly known as: LOVENOX Inject 0.8 mLs (80 mg total) into the skin daily.   ibuprofen 600 MG tablet Commonly known as: ADVIL Take 1 tablet (600 mg total) by mouth every 6 (six) hours as needed for moderate pain or cramping.   polyethylene glycol 17 g packet Commonly known as: MiraLax Take 17 g by mouth daily.   prenatal vitamin w/FE, FA 27-1 MG Tabs tablet Take 1 tablet by mouth daily at 12 noon.         Discharge home in stable condition Infant Feeding: Breast Infant Disposition:home with mother Discharge instruction: per After Visit Summary and Postpartum booklet. Activity: Advance as tolerated. Pelvic rest for 6 weeks.  Diet: routine diet Future Appointments: Future Appointments  Date Time Provider [place]  05/01/2020 11:00 AM Desenglau, Tommy Rainwater, PT [facility]   Follow up Visit:  Follow-up Information     [place]. Schedule an appointment as soon as possible for a visit in 4 week(s).   Specialty: Obstetrics and Gynecology Contact information: [place] [address] [phone]               Message sent to [facility] on 04/21/20 to schedule PP appt.  Please schedule this patient for a In person postpartum visit in 4 weeks with the following provider: MD. Additional Postpartum F/U: none   High risk pregnancy complicated by:  h/o DVT (on lovenox 74m daily in pregnancy; needs to continue for 6 weeks PP), suspected LGA infant on ultrasound Delivery mode:  Vaginal, Spontaneous  Anticipated Birth Control:  Unsure Considering Paragard (no estrogen given h/o DVT)  04/22/2020 RBaldo Ash MD  I personally saw and evaluated the patient,  performing the key elements of the service. I developed and verified the management plan that is described in the resident's/student's note, and I agree with the content with my edits above. VSS, HRR&R, Resp unlabored, Legs neg.  FNigel Berthold CNM 04/24/2020 9:12 AM

## 2020-04-20 NOTE — Anesthesia Procedure Notes (Signed)
Epidural Patient location during procedure: OB Start time: 04/20/2020 3:07 AM End time: 04/20/2020 3:19 AM  Staffing Anesthesiologist: Trevor Iha, MD Performed: anesthesiologist   Preanesthetic Checklist Completed: patient identified, IV checked, site marked, risks and benefits discussed, surgical consent, monitors and equipment checked, pre-op evaluation and timeout performed  Epidural Patient position: sitting Prep: DuraPrep and site prepped and draped Patient monitoring: continuous pulse ox and blood pressure Approach: midline Location: L3-L4 Injection technique: LOR air  Needle:  Needle type: Tuohy  Needle gauge: 17 G Needle length: 9 cm and 9 Needle insertion depth: 7 cm Catheter type: closed end flexible Catheter size: 19 Gauge Catheter at skin depth: 12 cm Test dose: negative  Assessment Events: blood not aspirated, injection not painful, no injection resistance, no paresthesia and negative IV test  Additional Notes Patient identified. Risks/Benefits/Options discussed with patient including but not limited to bleeding, infection, nerve damage, paralysis, failed block, incomplete pain control, headache, blood pressure changes, nausea, vomiting, reactions to medication both or allergic, itching and postpartum back pain. Confirmed with bedside nurse the patient's most recent platelet count. Confirmed with patient that they are not currently taking any anticoagulation, have any bleeding history or any family history of bleeding disorders. Patient expressed understanding and wished to proceed. All questions were answered. Sterile technique was used throughout the entire procedure. Please see nursing notes for vital signs. Test dose was given through epidural needle and negative prior to continuing to dose epidural or start infusion. Warning signs of high block given to the patient including shortness of breath, tingling/numbness in hands, complete motor block, or any  concerning symptoms with instructions to call for help. Patient was given instructions on fall risk and not to get out of bed. All questions and concerns addressed with instructions to call with any issues.1  Attempt (S) . Patient tolerated procedure well.

## 2020-04-20 NOTE — Plan of Care (Signed)
  Problem: Education: Goal: Knowledge of Childbirth will improve Outcome: Completed/Met Goal: Ability to make informed decisions regarding treatment and plan of care will improve Outcome: Completed/Met Goal: Ability to state and carry out methods to decrease the pain will improve Outcome: Completed/Met Goal: Individualized Educational Video(s) Outcome: Completed/Met   Problem: Coping: Goal: Ability to verbalize concerns and feelings about labor and delivery will improve Outcome: Completed/Met   Problem: Life Cycle: Goal: Ability to make normal progression through stages of labor will improve Outcome: Completed/Met Goal: Ability to effectively push during vaginal delivery will improve Outcome: Completed/Met   Problem: Role Relationship: Goal: Will demonstrate positive interactions with the child Outcome: Completed/Met   Problem: Safety: Goal: Risk of complications during labor and delivery will decrease Outcome: Completed/Met   Problem: Pain Management: Goal: Relief or control of pain from uterine contractions will improve Outcome: Completed/Met   Problem: Education: Goal: Knowledge of General Education information will improve Description: Including pain rating scale, medication(s)/side effects and non-pharmacologic comfort measures Outcome: Completed/Met   Problem: Health Behavior/Discharge Planning: Goal: Ability to manage health-related needs will improve Outcome: Completed/Met   Problem: Clinical Measurements: Goal: Ability to maintain clinical measurements within normal limits will improve Outcome: Completed/Met Goal: Will remain free from infection Outcome: Completed/Met Goal: Diagnostic test results will improve Outcome: Completed/Met Goal: Respiratory complications will improve Outcome: Completed/Met Goal: Cardiovascular complication will be avoided Outcome: Completed/Met   Problem: Activity: Goal: Risk for activity intolerance will decrease Outcome:  Completed/Met   Problem: Nutrition: Goal: Adequate nutrition will be maintained Outcome: Completed/Met   Problem: Coping: Goal: Level of anxiety will decrease Outcome: Completed/Met   Problem: Elimination: Goal: Will not experience complications related to bowel motility Outcome: Completed/Met Goal: Will not experience complications related to urinary retention Outcome: Completed/Met   Problem: Pain Managment: Goal: General experience of comfort will improve Outcome: Completed/Met   Problem: Safety: Goal: Ability to remain free from injury will improve Outcome: Completed/Met   Problem: Skin Integrity: Goal: Risk for impaired skin integrity will decrease Outcome: Completed/Met

## 2020-04-20 NOTE — Progress Notes (Signed)
Labor Progress Note  Subjective:  Feeling contractions strongly, very shaky   Objective:  BP 123/67   Pulse 64   Temp 98.9 F (37.2 C) (Oral)   Resp 18   Ht 5\' 5"  (1.651 m)   Wt 82.6 kg   LMP 07/20/2019 (Exact Date)   SpO2 100%   BMI 30.29 kg/m  Cervical: posterior, 6/60/-2, vertex,   Toco: q1 min  FH: BL 135, decel x7 minutes to 80s, subsequent rebound to 150s w minimal variability and decelerations, intermittent variables. Cat II Tracing.   Assessment and Plan:  Olivia Hayes is a 24 y.o. G2P0010 at [redacted]w[redacted]d admitted for IOL for LGA (EFW 4.1kg). Hx of DVT on lovenox (last dose 04/18/20)  Labor:  S/p FB. Cytotec x 2 2200. Patient continued to contract without intervention. Received epidural at around 0300 and subsequently was hypotensive to 90/50's, received phenylephrine 80. Continued to be tachysystolic w frequent lates/variables, given terbutaline with subsequent improvement. Cat II tracing but improved. Continue to monitor.    . Pain control: epidural in place  . Anticipated MOD: NSVD  Fetal Wellbeing: Category I . GBS positive, PCN  . Continuous fetal monitoring   04/20/20, MD [facility] Family Medicine Fellow, [facility] for [facility], [facility]  04/20/2020 4:09 AM

## 2020-04-21 MED ORDER — CYCLOBENZAPRINE HCL 5 MG PO TABS
5.0000 mg | ORAL_TABLET | Freq: Once | ORAL | Status: AC
  Administered 2020-04-21: 5 mg via ORAL
  Filled 2020-04-21: qty 1

## 2020-04-21 MED ORDER — ENOXAPARIN SODIUM 80 MG/0.8ML ~~LOC~~ SOLN
80.0000 mg | SUBCUTANEOUS | Status: DC
  Administered 2020-04-22: 80 mg via SUBCUTANEOUS
  Filled 2020-04-21: qty 0.8

## 2020-04-21 MED ORDER — CYCLOBENZAPRINE HCL 5 MG PO TABS
5.0000 mg | ORAL_TABLET | Freq: Three times a day (TID) | ORAL | Status: DC | PRN
  Administered 2020-04-21: 5 mg via ORAL
  Filled 2020-04-21: qty 1

## 2020-04-21 MED ORDER — ENOXAPARIN SODIUM 80 MG/0.8ML ~~LOC~~ SOLN: 80.0000 mg | SUBCUTANEOUS | Status: DC

## 2020-04-21 MED ORDER — ENOXAPARIN SODIUM 80 MG/0.8ML ~~LOC~~ SOLN
80.0000 mg | Freq: Two times a day (BID) | SUBCUTANEOUS | Status: DC
  Administered 2020-04-21: 80 mg via SUBCUTANEOUS
  Filled 2020-04-21: qty 0.8

## 2020-04-21 NOTE — Lactation Note (Signed)
This note was copied from a baby's chart. Lactation Consultation Note Baby 15 hrs old. Assisted in latching. Mom c/o pain and sore nipples. No break down noted. Baby has thick labial frenulum and wide center gap to upper gum. Mom stated she had that as a child as well. 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  LC concerned d/t painful latching and weight loss there might be a transfer issue. LC fitted mom #20 NS. Mom stated cont. To have painful latch. #24 fitted mom stated better. Baby BF great hearing occasional swallows. When baby finished only a few dots of colostrum noted. Nipple wet. No pudling in NS. Significant softening to breast after feeding.  Discussed w/mom need to use DEBP when using NS. Mom agreed. Mom shown how to use DEBP & how to disassemble, clean, & reassemble parts. Mom knows to pump q3h for 15-20 min.  Baby relaxed after 20 minute feeding. Mom almost in tears from severe cramping. RN notified of pain and brought pain med. Mom has heating pad on abd. For cramps.  Encouraged mom to wear shells in am. Mom didn't wear them today. Encouraged to call for assistance.   Patient Name: Olivia Hayes [id number] Date: 04/21/2020 Reason for consult: Follow-up assessment;Term;Primapara   Maternal Data    Feeding Feeding Type: Breast Fed  LATCH Score Latch: Grasps breast easily, tongue down, lips flanged, rhythmical sucking.  Audible Swallowing: A few with stimulation  Type of Nipple: Flat (semi flat, very short shaft)  Comfort (Breast/Nipple): Filling, red/small blisters or bruises, mild/mod discomfort (sore)  Hold (Positioning): Assistance needed to correctly position infant at breast and maintain latch.  LATCH Score: 6  Interventions Interventions: Breast feeding basics reviewed;Support pillows;Assisted with latch;Position options;Skin to skin;Breast massage;Hand express;Pre-pump if needed;Breast compression;Hand pump;DEBP;Adjust  position  Lactation Tools Discussed/Used Tools: Shells;Pump;Nipple Shields Nipple shield size: 24;20 Shell Type: Inverted Breast pump type: Double-Electric Breast Pump Pump Review: Setup, frequency, and cleaning;Milk Storage Initiated by:: Peri Jefferson RN IBCLC Date initiated:: 04/21/20   Consult Status Consult Status: Follow-up Date: 04/22/20 Follow-up type: In-patient    Charyl Dancer 04/21/2020, 8:53 PM

## 2020-04-21 NOTE — Lactation Note (Signed)
This note was copied from a baby's chart. Lactation Consultation Note Baby 9 hrs old. Baby sleeping. Mom telling LC that she is having a hard time latching. Can't latch to Lt. Breast. RN assisted in latching to Rt. Breast. Both nipples are flat. Lt. Breast is fuller than Rt. And not as compressible as Rt. That is why she can't get baby to latch. LC demonstrated hand expression which colostrum poured well from breast. Encouraged to use finger stimulation to evert flat nipples prior to latching.  Hand pump given for pre-pumping to evert nipple more. Shells given to wear in am to evert nipples.  Baby spitting up clear fluid. LC used bulb syring to clear mouth. Gave baby to mom to hold up right for a while.  Newborn behavior, STS, I&O, milk storage, breast massage, supply and demand discussed. Mom getting sleepy from hand expression.  Mom encouraged to feed baby 8-12 times/24 hours and with feeding cues. Mom encouraged to waken baby for feeds if hasn't cued in 3 hrs.  Encouraged mom to call for assistance if needed. Lactation brochure given.  Patient Name: Olivia Hayes EXNTZ'G Date: 04/21/2020 Reason for consult: Initial assessment;Primapara;Term   Maternal Data Has patient been taught Hand Expression?: Yes Does the patient have breastfeeding experience prior to this delivery?: No  Feeding Feeding Type: Breast Fed  LATCH Score Latch: Too sleepy or reluctant, no latch achieved, no sucking elicited.  Audible Swallowing: None  Type of Nipple: Flat  Comfort (Breast/Nipple): Soft / non-tender  Hold (Positioning): Assistance needed to correctly position infant at breast and maintain latch.  LATCH Score: 9  Interventions Interventions: Breast feeding basics reviewed;Support pillows;Skin to skin;Expressed milk;Breast massage;Hand express;Shells;Pre-pump if needed;Hand pump;Breast compression  Lactation Tools Discussed/Used Tools: Shells;Pump Shell Type:  Inverted Breast pump type: Manual WIC Program: Yes Pump Review: Setup, frequency, and cleaning;Milk Storage Initiated by:: Peri Jefferson RN IBCLC Date initiated:: 04/21/20   Consult Status Consult Status: Follow-up Date: 04/21/20 Follow-up type: In-patient    Olivia Hayes 04/21/2020, 12:29 AM

## 2020-04-21 NOTE — Anesthesia Postprocedure Evaluation (Signed)
Anesthesia Post Note  Patient: Olivia Hayes  Procedure(s) Performed: AN AD HOC LABOR EPIDURAL     Patient location during evaluation: Mother Baby Anesthesia Type: Epidural Level of consciousness: awake and alert and oriented Pain management: satisfactory to patient Vital Signs Assessment: post-procedure vital signs reviewed and stable Respiratory status: respiratory function stable Cardiovascular status: stable Postop Assessment: no headache, no backache, epidural receding, patient able to bend at knees, no signs of nausea or vomiting, adequate PO intake and able to ambulate Anesthetic complications: no   No complications documented.  Last Vitals:  Vitals:   04/21/20 0519 04/21/20 0852  BP: 121/76 115/70  Pulse: 78 71  Resp: 16 18  Temp: 36.6 C 36.6 C  SpO2: 99% 98%    Last Pain:  Vitals:   04/21/20 0852  TempSrc: Oral  PainSc: 7    Pain Goal: Patients Stated Pain Goal: 0 (04/19/20 2351)                 Karleen Dolphin

## 2020-04-21 NOTE — Discharge Instructions (Signed)

## 2020-04-21 NOTE — Social Work (Addendum)
-   CSW received and acknowledges consult for EDPS of 9.  Consult screened out due to 9 on EDPS does not warrant a CSW consult.  MOB whom scores are greater than 9 and answers yes to question 10 on Edinburgh Postpartum Depression Screen warrants a CSW consult.  - CSW received consult for hx of marijuana use.  Referral was screened out due to the following:  ~MOB had no documented substance use after initial prenatal visit/+UPT. ~MOB had no positive drug screens after initial prenatal visit/+UPT.  Please consult CSW if current concerns arise or by MOB's request.  CSW will monitor UDS/CDS results and make report to Child Protective Services if warranted.  Manfred Arch, LCSWA Clinical Social Worker [facility] [phone]    Manfred Arch, [place] Clinical Social Work [employer] [facility] [employer] [phone]

## 2020-04-21 NOTE — Progress Notes (Signed)
Post Partum Day 1 Subjective: no complaints, up ad lib, voiding, tolerating PO and + flatus  Objective: Blood pressure 121/76, pulse 78, temperature 97.9 F (36.6 C), temperature source Oral, resp. rate 16, height 5\' 5"  (1.651 m), weight 82.6 kg, last menstrual period 07/20/2019, SpO2 99 %, unknown if currently breastfeeding.  Physical Exam:  General: alert, cooperative and no distress Lochia: appropriate Uterine Fundus: firm Incision:n/a DVT Evaluation: No evidence of DVT seen on physical exam.  Recent Labs    04/19/20 1647  HGB 13.2  HCT 40.6    Assessment/Plan: Plan for discharge tomorrow, Breastfeeding, Lactation consult and Contraception undecided   Working with lactation on infants latch but feels like it is improving. Encouraged by colostrum she is able to express. Discussed contraception options at length. 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   LOS: 2 days   04/21/20 CNM 04/21/2020, 6:03 AM

## 2020-04-22 MED ORDER — POLYETHYLENE GLYCOL 3350 17 G PO PACK: 17.0000 g | PACK | Freq: Every day | ORAL | 0 refills | Status: DC

## 2020-04-22 MED ORDER — ACETAMINOPHEN 325 MG PO TABS: 650.0000 mg | ORAL_TABLET | Freq: Four times a day (QID) | ORAL | Status: DC | PRN

## 2020-04-22 MED ORDER — IBUPROFEN 600 MG PO TABS: 600.0000 mg | ORAL_TABLET | Freq: Four times a day (QID) | ORAL | Status: DC | PRN

## 2020-04-22 MED ORDER — IBUPROFEN 600 MG PO TABS: 600.0000 mg | ORAL_TABLET | Freq: Four times a day (QID) | ORAL | 0 refills | Status: DC | PRN

## 2020-04-22 MED ORDER — COCONUT OIL OIL: 1.0000 "application " | TOPICAL_OIL | 0 refills | Status: DC | PRN

## 2020-04-22 MED ORDER — MEASLES, MUMPS & RUBELLA VAC IJ SOLR: 0.5000 mL | Freq: Once | INTRAMUSCULAR | Status: DC

## 2020-04-22 NOTE — Lactation Note (Signed)
This note was copied from a baby's chart. Lactation Consultation Note  Patient Name: Olivia Hayes HUTML'Y Date: 04/22/2020   Swallows observed when infant was at the breast (verified by cervical auscultation). However, infant had a short feeding & Mom is willing to stay so I can observe another feeding. Dad was taught breast compressions to increase frequency of swallows when infant seems sleepy or needs prompting to suckle.  Mom has a [employer] pump at home & was planning on pumping once she got home.   Mom knows how to reach me when she is ready for me to return.    Lurline Hare [facility] 04/22/2020, 9:57 AM

## 2020-04-22 NOTE — Lactation Note (Signed)
This note was copied from a baby's chart. Lactation Consultation Note  Patient Name: Olivia Hayes OTRRN'H Date: 04/22/2020   Mom called for me to return to observe a feeding. Infant did well at the breast & the suck:swallow bursts have a suck:swallow ratio of 1:1. Mom reports that her breasts are "a lot heavier." Parents also mentioned that her breasts seem bigger today than yesterday.   Because infant's last 2 feedings have been short, I recommended that Mom pump about 6 times between getting home & her MD appt tomorrow (and feed back any EBM that she is able to express with a slow-flow nipple). I clarified that Mom won't need to pump if infant begins to have longer feedings and is continuing to do well with feedings.   I also provided a pictorial sheet of expected output so that parents can keep up with infant's output. Infant's output since birth has been excellent.   Mom has a pediatrician's appt tomorrow in the afternoon at 1pm & she has a lactation visit with Darlin Priestly, RN, IBCLC on Thursday morning.  Mom knows how to reach [place] for any post-d/c questions.   Dr. Margo Aye was given an update.   Lurline Hare [facility] 04/22/2020, 11:03 AM

## 2020-04-22 NOTE — Lactation Note (Signed)
This note was copied from a baby's chart. Lactation Consultation Note  Patient Name: Olivia Hayes HQUIQ'N Date: 04/22/2020   Infant is 33 hrs old; infant fed recently & is currently content (sleeping). With Mom's permission, I palpated her breasts, which are filling. Mom inquired about how to know if infant is transferring milk. I explained the difference in jaw movement & possible sound she may hear.   Mom is currently finishing breakfast.I offered to return to observe/assist with a feeding. She was agreeable. Mom will call for me when she is ready for me to return.     Lurline Hare [facility] 04/22/2020, 9:07 AM

## 2020-04-23 ENCOUNTER — Encounter (HOSPITAL_COMMUNITY): Payer: Self-pay | Admitting: Obstetrics & Gynecology

## 2020-04-23 ENCOUNTER — Inpatient Hospital Stay (HOSPITAL_COMMUNITY)
Admission: AD | Admit: 2020-04-23 | Discharge: 2020-04-23 | Disposition: A | Discharge status: Home / Self Care | Payer: Managed Care, Other (non HMO) | Attending: Obstetrics & Gynecology | Admitting: Obstetrics & Gynecology

## 2020-04-23 ENCOUNTER — Telehealth: Payer: Self-pay | Admitting: Obstetrics & Gynecology

## 2020-04-23 ENCOUNTER — Other Ambulatory Visit: Payer: Self-pay

## 2020-04-23 ENCOUNTER — Ambulatory Visit: Payer: Self-pay | Admitting: *Deleted

## 2020-04-23 DIAGNOSIS — M546 Pain in thoracic spine: Secondary | ICD-10-CM

## 2020-04-23 DIAGNOSIS — O909 Complication of the puerperium, unspecified: Secondary | ICD-10-CM | POA: Insufficient documentation

## 2020-04-23 DIAGNOSIS — Z86718 Personal history of other venous thrombosis and embolism: Secondary | ICD-10-CM | POA: Diagnosis not present

## 2020-04-23 DIAGNOSIS — O99893 Other specified diseases and conditions complicating puerperium: Secondary | ICD-10-CM | POA: Diagnosis not present

## 2020-04-23 DIAGNOSIS — Z8619 Personal history of other infectious and parasitic diseases: Secondary | ICD-10-CM | POA: Insufficient documentation

## 2020-04-23 DIAGNOSIS — O9279 Other disorders of lactation: Secondary | ICD-10-CM | POA: Insufficient documentation

## 2020-04-23 DIAGNOSIS — M5489 Other dorsalgia: Secondary | ICD-10-CM | POA: Diagnosis not present

## 2020-04-23 LAB — CBC WITH DIFFERENTIAL/PLATELET
Abs Immature Granulocytes: 0.35 10*3/uL — ABNORMAL HIGH (ref 0.00–0.07)
Basophils Absolute: 0.1 10*3/uL (ref 0.0–0.1)
Basophils Relative: 1 %
Eosinophils Absolute: 0.4 10*3/uL (ref 0.0–0.5)
Eosinophils Relative: 3 %
HCT: 39.4 % (ref 36.0–46.0)
Hemoglobin: 13.2 g/dL (ref 12.0–15.0)
Immature Granulocytes: 3 %
Lymphocytes Relative: 14 %
Lymphs Abs: 1.8 10*3/uL (ref 0.7–4.0)
MCH: 29.5 pg (ref 26.0–34.0)
MCHC: 33.5 g/dL (ref 30.0–36.0)
MCV: 87.9 fL (ref 80.0–100.0)
Monocytes Absolute: 0.6 10*3/uL (ref 0.1–1.0)
Monocytes Relative: 5 %
Neutro Abs: 9.9 10*3/uL — ABNORMAL HIGH (ref 1.7–7.7)
Neutrophils Relative %: 74 %
Platelets: 189 10*3/uL (ref 150–400)
RBC: 4.48 MIL/uL (ref 3.87–5.11)
RDW: 13.9 % (ref 11.5–15.5)
WBC: 13.1 10*3/uL — ABNORMAL HIGH (ref 4.0–10.5)
nRBC: 0 % (ref 0.0–0.2)

## 2020-04-23 LAB — TYPE AND SCREEN
ABO/RH(D): A POS
Antibody Screen: NEGATIVE

## 2020-04-23 MED ORDER — OXYCODONE-ACETAMINOPHEN 5-325 MG PO TABS
1.0000 | ORAL_TABLET | Freq: Four times a day (QID) | ORAL | 0 refills | Status: AC | PRN
Start: 2020-04-23 — End: 2020-04-26

## 2020-04-23 MED ORDER — CYCLOBENZAPRINE HCL 10 MG PO TABS: 10.0000 mg | ORAL_TABLET | Freq: Three times a day (TID) | ORAL | 1 refills | Status: DC | PRN

## 2020-04-23 MED ORDER — CYCLOBENZAPRINE HCL 5 MG PO TABS
10.0000 mg | ORAL_TABLET | Freq: Once | ORAL | Status: AC
  Administered 2020-04-23: 10 mg via ORAL
  Filled 2020-04-23: qty 2

## 2020-04-23 MED ORDER — OXYCODONE-ACETAMINOPHEN 5-325 MG PO TABS: 2.0000 | ORAL_TABLET | Freq: Once | ORAL | Status: DC

## 2020-04-23 MED ORDER — COCONUT OIL OIL
1.0000 "application " | TOPICAL_OIL | Status: DC | PRN
  Filled 2020-04-23 (×2): qty 120

## 2020-04-23 NOTE — MAU Note (Signed)
Pt delivered vaginally on Sunday, was D/C yesterday, has since had increased VB with clots.  Also states her abdominal pain has gotten worse and is experiencing stinging at her stitches.  Denies taking anything for the pain.

## 2020-04-23 NOTE — Telephone Encounter (Signed)
Patient has requested a call from the clinical staff. She just gave birt, and has questions.

## 2020-04-23 NOTE — Telephone Encounter (Signed)
C/o increased bleeding today with large clots after vaginal delivery on Sunday. D/C'd from hospital last night. Patient noted larger clots this pm and is still taking lovenox . Currently breastfeeding . Denies dizziness, fever, SOB. Reports abdominal pain that has increased and is difficult to walk. Instructed patient to go to LD/ED now and to call 911 if she feels worsening symptoms. Care advise given. Patient verbalized understanding of care advise and to call 911 if symptoms worsen.   Reason for Disposition  SEVERE vaginal bleeding (e.g., soaking 2 pads per hour and present 2 or more hours; large blood clots)  Answer Assessment - Initial Assessment Questions 1. ONSET: "Describe your bleeding: is it getting worse, staying the same, improving, or stopping and starting?"     Worse now with clots  2. AMOUNT: "How much bleeding are you having today?"     - SPOTTING: spotting, or pinkish / brownish mucous discharge; does not fill panti-liner or pad    - MILD:  less than 1 pad / hour; less than patient's usual menstrual bleeding   - MODERATE: 1-2 pads / hour; 1 menstrual cup every 6 hours; small-medium blood clots (e.g., pea, grape, small coin)   - SEVERE: soaking 2 or more pads/hour for 2 or more hours; 1 menstrual cup every 2 hours; bleeding not contained by pads or continuous red blood from vagina; large blood clots (e.g., golf ball, large coin)      severe 3. ABDOMINAL PAIN: "Do you have any pain?" "How bad is the pain?" "What does it keep you from doing?"     - MILD -  doesn't interfere with normal activities, abdomen soft and not tender to touch     - MODERATE - interferes with normal activities or awakens from sleep, tender to touch     - SEVERE - excruciating pain, doubled over, unable to do any normal activities       Severe  4. HORMONES: "Are you taking any birth control medications?" (e.g., birth control pills, Depo-Provera)     No  5. BLOOD THINNERS: "Do you take any blood thinners?"  (e.g., coumadin, aspirin)     lovenox  6. OTHER SYMPTOMS: "Do you have any other symptoms?" (e.g., fever, chills, dizziness)     No  7. DELIVERY DATE: "When was your delivery date?" "Vaginal delivery or c-section?"     Vaginal delivery  8. BREASTFEEDING: "Are you breastfeeding?"     Yes  Protocols used: POSTPARTUM - VAGINAL BLEEDING AND LOCHIA-A-AH

## 2020-04-23 NOTE — Discharge Instructions (Signed)
Breast Pumping Tips There may be times when you cannot feed your baby from your breast, such as when you are at work or on a trip. Breast pumping allows you to remove milk from your breast in order to store for later use. There are three ways to pump. You can use:  Your hand to massage and squeeze your breast (hand expression).  A handheld manual pump.  An electric pump. When you first start to pump, you may not get much milk, but after a few days your breasts should start to make more. Pumping can help stimulate your milk supply after your baby is born. It can also help maintain your milk supply when you are away from your baby. When should I pump? You can start pumping soon after your baby is born. Here are some tips on when to pump:  When with your baby: ? Pump after breastfeeding. ? Pump from the free breast while you breastfeed.  When away from your baby: ? Pump every 2-3 hours for about 15 minutes. ? Pump both breasts at the same time if you can.  If your baby gets formula feeding, pump around the time your baby gets that feeding.  If you drank alcohol, wait 2 hours before pumping.  If you are having a procedure with anesthesia, talk to your health care provider about when you should pump before and after. How do I prepare to pump? Take steps to relax. This makes it easier to stimulate your let-down reflex, which is what makes breast milk flow. To help:  Smell one of your infant's blankets or an item of clothing.  Look at a picture or video of your infant.  Sit in a quiet, private space.  Massage your breast and nipple.  Place a warm cloth on your breast. The cloth should be a little wet.  Play relaxing music.  Picture your milk flowing. What are some tips? General tips for pumping breast milk   Always wash your hands before pumping.  If you are not getting very much milk or pumping is uncomfortable, make adjustments to your pump or try using different type of  pumps.  Drink enough fluid to keep your urine clear or pale yellow.  Wear clothing that opens in the front or allows easy access to your breasts.  Pump breast milk directly into clean bottles or other storage containers.  Do not use any products that contain nicotine or tobacco, such as cigarettes and e-cigarettes. These can lower your milk supply and harm your infant. If you need help quitting, ask your health care provider. Tips for storing breast milk   Store breast milk in a clean, BPA-free container, such as glass or plastic bottles or milk storage bags.  Store breast milk in 2-4 ounce batches to reduce waste.  Swirl the breast milk in the container to mix any cream that floats to the top. Do not shake it.  Label all stored milk with the date you pumped it.  The amount of time you can keep breast milk depends on where it is stored: ? Room temperature: 6-8 hours, if the milk is clean. It is best if used within 4 hours. ? Cooler with ice packs: 24 hours. ? Refrigerator: 5-8 days, if the milk is clean. It is best if used within 3 days. ? Freezer: 9-12 months, if the milk is clean and stored away from the freezer door. It is best if used within 6 months.  When using a refrigerator   or freezer, put the milk in the back to keep it as cold as possible.  Thaw frozen milk using warm water. Do not use the microwave. Tips for choosing a breast pump The right pump for you will depend on your comfort and how often you will be away from your baby. When choosing a pump, consider the following:  Manual breast pumps do not need electricity to work. They are usually cheaper than electric pumps, but they can be harder to use. They may be a good choice if you are occasionally away from your baby.  Electric breast pumps are usually more expensive than manual pumps, but they can be easier for some women to use. They can also collect more milk than manual pumps. This makes them a good choice for women  who work in an office or need to be away from their baby for longer periods of time.  The suction cup (flange) should be the right size. If it is the wrong size, it may cause pain and nipple damage.  Before buying a pump, find out whether your insurance covers the cost of a breast pump. Tips for maintaining a breast pump  Check your pump's manual for cleaning tips.  Clean the pump after each use. To do this: ? Wipe down the electrical unit. Use a dry, soft cloth or clean paper towel. Do not put the electrical unit in water or cleaning products. ? Wash the plastic pump parts with soap and warm water or in the dishwasher, if the parts are dishwasher safe. You do not need to clean the tubing unless it comes in contact with breast milk. Let the parts air dry. Avoid drying them with a cloth or towel. ? When the pump parts are clean and dry, put the pump back together. Then store the pump.  If there is water in the tubing when it comes time to pump, attach the tubing to the pump and turn on the pump. Run the pump until the tube is dry.  Avoid touching the inside of pump parts that come in contact with breast milk. Summary  Pumping can help stimulate your milk supply after your baby is born. It can also help maintain your milk supply when you are away from your baby.  When you are away from your infant for several hours, pump for about 15 minutes every 2-3 hours. Pump both breasts at the same time, if you can.  Your health care provider or lactation consultant can help you decide which breast pump is right for you. The right pump for you depends on your comfort, work schedule, and how often you may be away from your baby. This information is not intended to replace advice given to you by your health care provider. Make sure you discuss any questions you have with your health care provider. Document Revised: 10/11/2018 Document Reviewed: 07/26/2016 Elsevier Patient Education  2020 Elsevier  Inc.   Breast Engorgement Breast engorgement is the overfilling of your breasts with breast milk. It is usually caused by delaying feedings, which can cause milk to build up. Breast engorgement can happen at any time while you are breast feeding, and is normal in the first 3-5 days after giving birth. The condition can make your breasts feel heavy, full, hard, tightly stretched, warm, and tender. Breast engorgement should improve within 24-48 hours of feeding your baby or expressing your milk. Follow these instructions at home: When to breastfeed or pump  Breastfeed when your baby shows  signs of hunger. This is called "breastfeeding on demand."  Breastfeed or use a breast pump to remove milk from your breasts when you feel the need to reduce the fullness of your breasts.  If your baby is younger than 1 month, make sure you are breastfeeding every 1-3 hours during the day. You may need to wake up your baby to feed if he or she is asleep at a feeding time.  Do not allow your baby to sleep longer than 5 hours during the night without a feeding.  Do not delay feedings.  If you are returning to work or are away from home for an extended period, try to pump your milk on the same schedule as when your baby would breastfeed. Before breastfeeding or pumping:  Increase the circulation in your breasts and help your milk flow. Try either of these methods: ? Taking a warm shower. ? Applying warm, water-soaked hand towels to your breasts. ? Massaging your breasts.  Pump or hand-express breast milk before breastfeeding to soften your breast, areola, and nipple. During breastfeeding or pumping:  Try to relax when it is time to feed your baby. This helps to trigger your "let-down reflex," which releases milk from your breast.  Ensure your baby is latched on to your breast and positioned properly while breastfeeding.  Empty your breasts completely when breastfeeding or pumping.  Allow your baby to  remain at your breast as long as he or she is latched on well and sucking. Your baby will let you know when he or she is done breastfeeding by pulling away from your breast or falling asleep.  Massage your breasts to help your milk flow. Managing pain and swelling   Take over-the-counter and prescription medicines only as told by your health care provider.  If directed, put ice on your breasts: ? Put ice in a plastic bag. ? Place a towel between your skin and the bag. ? Leave the ice on for 20 minutes, 2-3 times a day.  If you feel pain while breastfeeding, take your baby off your breast and try again. General instructions  After breastfeeding or pumping wear a snug bra or tank top for 1-2 days. This will signal your body to slightly decrease how much milk it makes. Once the engorgement passes, make sure you to wear a well-fitted, supportive bra and regular clothes.  Drink enough fluid to keep your urine clear or pale yellow.  Avoid introducing bottles or pacifiers to your baby in the early weeks of breastfeeding. Wait to introduce these things until after resolving any breastfeeding challenges. Contact a health care provider if:  Engorgement lasts longer than 2 days, even after treatment.  You have flu-like symptoms, such as a fever, chills, or body aches.  You have nausea or you vomit.  Your breasts become red and painful.  You have a lump in your breast.  Your nipples continue to crack or start to ooze.  There is yellow discharge coming from a nipple.  You have pain while breastfeeding, and it does not go away once you take your baby off your breast and try again. Get help right away if:  There is pus or blood in your breast milk.  You have sudden, severe symptoms.  You have red streaks near your breast.  Both breasts appear infected and you cannot breastfeed. Summary  Breast engorgement is the overfilling of your breasts with breast milk. It is usually caused by  delayed feeding.  Although it is  normal to experience breast engorgement 3-5 days after giving birth, it can happen at any time while breastfeeding.  Do not delay feedings. Breastfeed on demand to help prevent engorgement.  Increase the circulation in your breasts and help your milk flow before feeding your baby. You can do this by taking a warm shower, applying warm water-soaked hand towels, or massaging your breasts. This information is not intended to replace advice given to you by your health care provider. Make sure you discuss any questions you have with your health care provider. Document Revised: 06/03/2017 Document Reviewed: 07/26/2016 Elsevier Patient Education  2020 Elsevier Inc.  Care of a Perineal Tear A perineal tear is a cut or tear (laceration) in the tissue between the opening of the vagina and the anus (perineum). Some women develop a perineal tear during a vaginal birth. This can happen as the baby emerges from the birth canal and the perineum is stretched. There are four degrees of perineal tears based on how deep and long the laceration is:  First degree. This involves a shallow tear at the edge of the vaginal opening that extends slightly into the perineal skin.  Second degree. This involves tearing described in first degree perineal tear, and an additional deeper tear of the vaginal opening and perineal tissues. It may also include tearing of a muscle just under the perineal skin.  Third degree. This involves tearing described in first and second degree perineal tears, with the addition that tearing in the third degree extends into the muscle of the anus (anal sphincter).  Fourth degree. This involves all levels of tears described in first, second, and third degree perineal tears, with the tear in the fourth degree extending into the rectum. First and second degree perineal tears may or may not be stitched closed, depending on their location and appearance. Third and  fourth degree perineal tears are stitched closed immediately after the baby's birth. What are the risks? Depending on the type of perineal tear you have, you may be at risk for:  Bleeding.  Developing a collection of blood in the perineal tear area (hematoma).  Pain. This may include pain when you urinate, or pain when you have a bowel movement.  Infection at the site of the tear.  Fever.  Trouble controlling your urination or bowels (incontinence).  Painful sex. How to care for a perineal tear Wound care  Take a sitz bath as told by your health care provider. A sitz bath is a warm water bath that is taken while you are sitting down. The water should only come up to your hips and should cover your buttocks. This can speed up healing. 1. Partially fill a bathtub with warm water. You will only need the water to be deep enough to cover your hips and buttocks when you are sitting in it. 2. If your health care provider told you to put medicine in the water, follow the directions exactly as told. 3. Sit in the water and open the tub drain a little. 4. Turn on the warm water again to keep the tub at the correct level. Keep the water running constantly. 5. Soak in the water for 15-20 minutes or as told by your health care provider. 6. After the sitz bath, pat the affected area dry first. Do not rub it. 7. Be careful when you stand up after the sitz bath because you may feel dizzy.  Wash your hands before and after applying medicine to the area.  Wear  a Arts administratorsanitary pad as told by your health care provider. Change the pad as often as told by your health care provider.  Leave stitches (sutures), skin glue, or adhesive strips in place. These skin closures may need to stay in place for 2 weeks or longer. If adhesive strip edges start to loosen and curl up, you may trim the loose edges. Do not remove adhesive strips completely unless your health care provider tells you to do that.  Check your wound  every day for signs of infection. Check for: ? Redness, swelling, or pain. ? Fluid or blood. ? Warmth. ? Pus or a bad smell. Managing pain  If directed, put ice on the painful area: ? Put ice in a plastic bag. ? Place a towel between your skin and the bag. ? Leave the ice on for 20 minutes, 2-3 times a day.  Apply a numbing spray to the perineal tear site as told by your health care provider. This may help with discomfort.  Take and apply over-the-counter and prescription medicines only as told by your health care provider.  If told, put about 3 witch hazel-containing hemorrhoid treatment pads on top of your sanitary pad. The witch hazel in the hemorrhoid pads helps with swelling and discomfort.  Sit on an inflatable ring or pillow. This may provide comfort. General instructions  Squeeze warm water on your perineum after urinating. This should be done from front to back with a squeeze bottle. Pat the area to dry it.  Do not have sex, use tampons, or place anything in your vagina for at least 6 weeks or as told by your health care provider.  Keep all follow-up visits as told by your health care provider. These include any postpartum visits. This is important. Contact a health care provider if:  Your pain is not relieved with medicines.  You have painful urination.  You have redness, swelling, or pain around your tear.  You have fluid or blood coming from your tear.  Your tear feels warm to the touch.  You have pus or a bad smell coming from your tear.  You have a fever. Get help right away if:  Your tear opens.  You cannot urinate.  You have an increase in bleeding.  You have severe pain. Summary  A perineal tear is a cut or tear (laceration) in the tissue between the opening of the vagina and the anus (perineum).  There are four degrees of perineal tears based on how deep and long the laceration is.  First and second-degree perineal tears may or may not be  stitched closed, depending on their location and appearance. Third and fourth- degree perineal tears are stitched closed immediately after the baby's birth.  Follow your health care provider's instructions for caring for your perineal tear. Know how to manage pain and how to care for your wound. Know when to call your health care provider and when to seek immediate emergency care. This information is not intended to replace advice given to you by your health care provider. Make sure you discuss any questions you have with your health care provider. Document Revised: 06/03/2017 Document Reviewed: 07/26/2016 Elsevier Patient Education  2020 [employer]

## 2020-04-23 NOTE — MAU Note (Deleted)
Fetal HR verifed by Bedside u/s performed by provider.

## 2020-04-23 NOTE — MAU Provider Note (Signed)
Chief Complaint:  Abdominal Pain and Vaginal Bleeding   First Provider Initiated Contact with Patient 04/23/20 2036       HPI: Olivia Hayes is a 24 y.o. G2P1011 on PPD#4 at who presents to maternity admissions reporting vaginal tenderness/pain, breast pain, and mid-back pain. Has not been taking any pain medications because she is on Lovenox and was only given ibuprofen at discharge. She has also been passing large clots, but none at time of MAU admission. She is breastfeeding, reports that the baby has not been latching very effectively so it hurts to feed and pump.   Past Medical History: Past Medical History:  Diagnosis Date  . Asthma    exercise induced  . BV (bacterial vaginosis)   . DVT (deep venous thrombosis) (HCC)    provoked by [facility]   . 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 Pelvic floor dysfunction   . Yeast infection     Past obstetric history: OB History  Gravida Para Term Preterm AB Living  2 1 1  0 1 1  SAB TAB Ectopic Multiple Live Births  0 1 0 0 1    # Outcome Date GA Lbr Len/2nd Weight Sex Delivery Anes PTL Lv  2 Term 04/20/20 [redacted]w[redacted]d 04:10 / 00:36 7 lb 1.6 oz (3.22 kg) F Vag-Spont EPI  LIV  1 TAB 10/2018            Past Surgical History: Past Surgical History:  Procedure Laterality Date  . ABDOMINAL HERNIA REPAIR  age 83  . INDUCED ABORTION      Family History: Family History  Problem Relation Age of Onset  . Thyroid disease Mother     Social History: Social History   Tobacco Use  . Smoking status: Never Smoker  . Smokeless tobacco: Never Used  Vaping Use  . Vaping Use: Never used  Substance Use Topics  . Alcohol use: Not Currently    Comment: Social  . Drug use: Not Currently    Types: Marijuana    Comment: last smoked beginning February 2021    Allergies:  Allergies  Allergen Reactions  . Peanuts [Peanut Oil] Anaphylaxis and Hives    Meds:  No medications prior to admission.    I have reviewed  patient's Past Medical Hx, Surgical Hx, Family Hx, Social Hx, medications and allergies.  ROS:  Review of Systems  Constitutional: Negative for fever.  Gastrointestinal: Negative for nausea and rectal pain.  Genitourinary: Positive for vaginal bleeding and vaginal pain. Negative for pelvic pain.  All other systems reviewed and are negative.  Physical Exam   Patient Vitals for the past 24 hrs:  BP Temp Pulse Resp Weight  04/23/20 1954 116/71 98.7 F (37.1 C) 91 17 173 lb 7 oz (78.7 kg)   Constitutional: Well-developed, well-nourished female, tearful and appears in mild distress from pain. Cardiovascular: normal rate and rhythm, no ectopy audible, S1 & S2 heard, no murmur Respiratory: normal effort, no distress. Lungs CTAB with no wheezes or crackles GI: Abd soft, non-tender. No distention, tenderness or guarding. Bowel sounds x4   MS: Extremities nontender, no edema, normal ROM Neurologic: Alert and oriented x 4.   GU: Neg CVAT. Skin:  Warm and Dry Psych:  Affect appropriate. Pelvic: very tender, no frank bleeding or clots noted on exam. Unrepaired bilateral labial tears and a piece of vaginal tissue protruding from the vaginal introitus very tender to touch, but no other s/sx of infection Breasts:  full but not quite engorged, no bruising/cracking/bleeding noted on either nipple   Labs: Results for orders placed or performed during the hospital encounter of 04/23/20 (from the past 24 hour(s))  CBC with Differential/Platelet     Status: Abnormal   Collection Time: 04/23/20  8:06 PM  Result Value Ref Range   WBC 13.1 (H) 4.0 - 10.5 K/uL   RBC 4.48 3.87 - 5.11 MIL/uL   Hemoglobin 13.2 12.0 - 15.0 g/dL   HCT 05.3 36 - 46 %   MCV 87.9 80.0 - 100.0 fL   MCH 29.5 26.0 - 34.0 pg   MCHC 33.5 30.0 - 36.0 g/dL   RDW 97.6 73.4 - 19.3 %   Platelets 189 150 - 400 K/uL   nRBC 0.0 0.0 - 0.2 %   Neutrophils Relative % 74 %   Neutro Abs 9.9 (H) 1.7 - 7.7 K/uL   Lymphocytes Relative 14 %    Lymphs Abs 1.8 0.7 - 4.0 K/uL   Monocytes Relative 5 %   Monocytes Absolute 0.6 0.1 - 1.0 K/uL   Eosinophils Relative 3 %   Eosinophils Absolute 0.4 0.0 - 0.5 K/uL   Basophils Relative 1 %   Basophils Absolute 0.1 0.0 - 0.1 K/uL   Immature Granulocytes 3 %   Abs Immature Granulocytes 0.35 (H) 0.00 - 0.07 K/uL  Type and screen [facility]     Status: None   Collection Time: 04/23/20  8:06 PM  Result Value Ref Range   ABO/RH(D) A POS    Antibody Screen NEG    Sample Expiration      04/26/2020,2359 Performed at [facility], [address], [place], [place] [address]    --/--/A POS (10/20 2006)  Imaging:  None  MAU Course/MDM: CBC with diff normal Assisted with latching baby to right breast. Mom experienced some pain with initial latch but was able to deepen latch easily and baby fed with audible swallows (and milk clearly visible around the mouth at letdown) for and unlatched herself to go to sleep. Breast softened well after feeding. Ice applied bilaterally with good relief. Pt declined offer to pump or help with nursing on left side. Reviewed latch techniques, mom verbalized understanding and confidence in doing so on her own.  Consulted with pharmacy regarding use of ibuprofen while pt on lovenox. Pharmacist agreed she should not be taking ibuprofen while on 80mg  daily of lovenox. Gave pt flexeril with complete relief of pain, will send home with script of flexeril and percocet to use for the next 3 days.  Reviewed perineal care and encouraged ice packs and sitz baths to relieve pain/tenderness. Also encouraged use of perineal bottle at each void. Suggest in person postpartum appointment to assess healing as there is a large piece of tissue protruding. Advised pt to let it heal and address with OB if it is a problem for her. Pt amenable to plan. Will send message to office to make her PP appointment in person.   Pt stable at time of  discharge.  Assessment: 1. Postpartum breast engorgement   2. Acute bilateral thoracic back pain    Plan: Discharge home in stable condition with return precautions Flexeril and 3 days of Percocet sent to pharmacy    Follow-up Information    [facility]. Go to.   Specialty: Obstetrics and Gynecology Why: as scheduled for postpartum follow up - needs to be in person for perineum  check Contact information: [address] [place] [address] [phone]             Edd Arbour, CNM, MSN, [facility] 04/24/20 1:05 AM

## 2020-05-01 ENCOUNTER — Encounter: Payer: Managed Care, Other (non HMO) | Admitting: Physical Therapy

## 2020-05-26 ENCOUNTER — Encounter: Payer: Self-pay | Admitting: Obstetrics & Gynecology

## 2020-05-26 ENCOUNTER — Ambulatory Visit (INDEPENDENT_AMBULATORY_CARE_PROVIDER_SITE_OTHER): Payer: Managed Care, Other (non HMO) | Admitting: Obstetrics & Gynecology

## 2020-05-26 ENCOUNTER — Other Ambulatory Visit: Payer: Self-pay

## 2020-05-26 DIAGNOSIS — O99345 Other mental disorders complicating the puerperium: Secondary | ICD-10-CM

## 2020-05-26 DIAGNOSIS — R4586 Emotional lability: Secondary | ICD-10-CM

## 2020-05-26 DIAGNOSIS — F419 Anxiety disorder, unspecified: Secondary | ICD-10-CM

## 2020-05-26 DIAGNOSIS — Z8759 Personal history of other complications of pregnancy, childbirth and the puerperium: Secondary | ICD-10-CM

## 2020-05-26 NOTE — Progress Notes (Signed)
° ° °  Post Partum Visit Note  Olivia Hayes is a 24 y.o. [id number] female who presents for a postpartum visit. She is 5 weeks postpartum following a normal spontaneous vaginal delivery.  I have fully reviewed the prenatal and intrapartum course. The delivery was at 39 gestational weeks.  Anesthesia: epidural. Postpartum course has been uncomplicated. Baby is doing well. Baby is feeding by both breast and bottle - Lucien Mons Start . Bleeding staining only. Bowel function is abnormal: constipation . Bladder function is normal. Patient is sexually active. Contraception method is condoms. Postpartum depression screening: neg but requesting due to mood swings.   The pregnancy intention screening data noted above was reviewed. Potential methods of contraception were discussed. The patient elected to proceed with IUD or IUS.      The following portions of the patient's history were reviewed and updated as appropriate: allergies, current medications, past family history, past medical history, past social history, past surgical history and problem list.  Review of Systems Pertinent items are noted in HPI.    Objective:  LMP 07/20/2019 (Exact Date)    General:  alert and cooperative   Breasts:  inspection negative, no nipple discharge or bleeding, no masses or nodularity palpable  Lungs: clear to auscultation bilaterally  Heart:  regular rate and rhythm, S1, S2 normal, no murmur, click, rub or gallop  Abdomen: soft, non-tender; bowel sounds normal; no masses,  no organomegaly   Vulva:  normal  Vagina: normal vagina  Cervix:  no cervical motion tenderness  Corpus: normal  Adnexa:  no mass, fullness, tenderness  Rectal Exam: Not performed.        Assessment:    Normal postpartum exam. Pap smear not done at today's visit.   Plan:   Essential components of care per ACOG recommendations:  1.  Mood and well being: Patient with negative depression screening today. Reviewed local resources for  support.  - Patient does not use tobacco.  2. Infant care and feeding:  -Patient currently breastmilk feeding? Yes  Breastmilk feeding discussed return to work and pumping. If needed, patient was provided letter for work to allow for every 2-3 hr pumping breaks, and to be granted a private location to express breastmilk and refrigerated area to store breastmilk. Reviewed importance of draining breast regularly to support lactation. -Social determinants of health (SDOH) reviewed in EPIC. No concerns  3. Sexuality, contraception and birth spacing - Patient does not want a pregnancy in the next year.  Desired family size is 3 children.  - Reviewed forms of contraception in tiered fashion. Patient desired IUD today.   - Discussed birth spacing of 18 months  4. Sleep and fatigue -Encouraged family/partner/community support of 4 hrs of uninterrupted sleep to help with mood and fatigue  5. Physical Recovery  - Discussed patients delivery and complications - Patient had a second degree laceration, perineal healing reviewed. Patient expressed understanding - Patient has urinary incontinence? No  - Patient is safe to resume physical and sexual activity  6.  Health Maintenance - Last pap smear done and was normal with negative HPV.   Malachy Chamber, MD [facility] [employer], [facility]

## 2020-06-02 ENCOUNTER — Telehealth: Payer: Self-pay | Admitting: Family Medicine

## 2020-06-02 NOTE — Telephone Encounter (Signed)
Patient scheduled appointment for a ParaGard IUD, would like a call she have a few questions.

## 2020-06-04 NOTE — Telephone Encounter (Signed)
Called patient stating I am returning her phone regarding questions about the Paragard. Patient states she has a history of a blood clot after being on birth control pills so she doesn't want hormones. Patient states she read the Paragard doesn't have hormones and wants to know if this would put her at risk for blood clots like the pills. Told patient it would not because it doesn't have hormones. Patient asked if the IUD would cause issues with intercourse, pain, timing of intercourse after insertion. Discussed with patient she can have sex right away if she chooses but she should use backup protection for 2 weeks as a precaution, discussed IUD strings can be trimmed if partner complains of feeling them with intercourse, but shouldn't interfere. Patient asked about excessive bleeding. Told patient based on research her first several periods/6 months will likely be heavier/more painful but most women's periods do improve after that time. Patient verbalized understanding and asked if the strings could be accidentally pulled out. Told patient no she would have to be trying to pull it out for that to happen. Patient verbalized understanding & states her daughter has an appt on the 20th at 2pm as well so she will need another appt time. Told patient I will let the front office staff know and they will call her with a new appt. Patient verbalized understanding.

## 2020-06-12 ENCOUNTER — Other Ambulatory Visit: Payer: Self-pay

## 2020-06-12 ENCOUNTER — Ambulatory Visit (INDEPENDENT_AMBULATORY_CARE_PROVIDER_SITE_OTHER): Payer: Managed Care, Other (non HMO) | Admitting: Obstetrics and Gynecology

## 2020-06-12 ENCOUNTER — Encounter: Payer: Self-pay | Admitting: Obstetrics and Gynecology

## 2020-06-12 DIAGNOSIS — K625 Hemorrhage of anus and rectum: Secondary | ICD-10-CM | POA: Diagnosis not present

## 2020-06-12 DIAGNOSIS — Z3202 Encounter for pregnancy test, result negative: Secondary | ICD-10-CM

## 2020-06-12 DIAGNOSIS — R8271 Bacteriuria: Secondary | ICD-10-CM | POA: Diagnosis not present

## 2020-06-12 DIAGNOSIS — M6289 Other specified disorders of muscle: Secondary | ICD-10-CM

## 2020-06-12 DIAGNOSIS — O0990 Supervision of high risk pregnancy, unspecified, unspecified trimester: Secondary | ICD-10-CM

## 2020-06-12 DIAGNOSIS — Z3009 Encounter for other general counseling and advice on contraception: Secondary | ICD-10-CM

## 2020-06-12 LAB — POCT PREGNANCY, URINE: Preg Test, Ur: NEGATIVE

## 2020-06-12 NOTE — Progress Notes (Signed)
Pt reports unprotected intercourse 4-5 days ago. Will be rescheduled for IUD insertion in 10+ days. Pt reports bleeding and tearing sensation with bowel movement. Reports feeling like something protrudes from rectum shortly after bowel movement. Pt would like to be evaluated for hemorrhoid today. GAD-7 positive today; pt states she was told she would be contacted by [facility] but has not received phone call. Pt agreeable to being scheduled for appt time with [facility].  Fleet Contras RN

## 2020-06-12 NOTE — Progress Notes (Signed)
   Subjective:  CC: IUD counseling  Patient ID: Olivia Hayes, female    DOB: 10/22/1995, 24 y.o.   MRN: [id number]  HPI Pt seen at [facility] office.  She was supposed to get IUD placed today, but she had unprotected intercourse four days ago.  While she was present, she had many answers about the paraguard device since she has had a DVT.  Placement and performance were discussed.  Questions about complications were answered.  At this time she is more comfortable about IUD placement.  Pt also was concerned about recent rectal bleeding.    She did have a second degree tear with recent childbirth, but she is sure it is coming from her rectum.  There is no bleeding with the bowel movement, but there is sometimes significant bleeding afterwards after she wipes.  The patient did note trying anal sex x 1, but was unsuccessful.   Review of Systems     Objective:   Physical Exam Vitals:   06/12/20 1427  BP: 117/78  Pulse: 70         Assessment & Plan:    1. Rectal bleeding Refer to GI for further evaluation and possible colonsocopy  2. Counseling for birth control regarding intrauterine device (IUD) Pt is willing to try paraguard IUD, she will follow up on 12/27 for placement.  She understands she needs to be abstinent or use protection to have a viable pregnancy test at time of placement.  Pt was given condoms at time of discharge.  I spent 30 minutes dedicated to the care of this patient including previsit review of records, face to face time with the patient discussing IUD placement, complication and side effects and post visit testing.     Warden Fillers, MD Faculty Attending, Center for [facility]

## 2020-06-23 ENCOUNTER — Ambulatory Visit: Payer: Managed Care, Other (non HMO) | Admitting: Obstetrics and Gynecology

## 2020-06-24 ENCOUNTER — Ambulatory Visit (INDEPENDENT_AMBULATORY_CARE_PROVIDER_SITE_OTHER): Payer: Managed Care, Other (non HMO) | Admitting: Nurse Practitioner

## 2020-06-24 ENCOUNTER — Encounter: Payer: Self-pay | Admitting: Nurse Practitioner

## 2020-06-24 ENCOUNTER — Other Ambulatory Visit (INDEPENDENT_AMBULATORY_CARE_PROVIDER_SITE_OTHER): Payer: Managed Care, Other (non HMO)

## 2020-06-24 VITALS — BP 118/60 | HR 86 | Ht 65.0 in | Wt 172.0 lb

## 2020-06-24 DIAGNOSIS — K5900 Constipation, unspecified: Secondary | ICD-10-CM

## 2020-06-24 DIAGNOSIS — K600 Acute anal fissure: Secondary | ICD-10-CM

## 2020-06-24 DIAGNOSIS — K625 Hemorrhage of anus and rectum: Secondary | ICD-10-CM

## 2020-06-24 LAB — CBC
HCT: 44.2 % (ref 36.0–46.0)
Hemoglobin: 14.8 g/dL (ref 12.0–15.0)
MCHC: 33.6 g/dL (ref 30.0–36.0)
MCV: 86.6 fl (ref 78.0–100.0)
Platelets: 230 10*3/uL (ref 150.0–400.0)
RBC: 5.1 Mil/uL (ref 3.87–5.11)
RDW: 14.2 % (ref 11.5–15.5)
WBC: 7.6 10*3/uL (ref 4.0–10.5)

## 2020-06-24 MED ORDER — POLYETHYLENE GLYCOL 3350 17 G PO PACK: 17.0000 g | PACK | Freq: Every day | ORAL | 3 refills | Status: DC

## 2020-06-24 NOTE — Progress Notes (Signed)
06/24/2020 Olivia Hayes [id number] 1996-05-17   CHIEF COMPLAINT: Rectal bleeding   HISTORY OF PRESENT ILLNESS: Olivia Hayes is a 24 year old female with a past medical history significant for exercise induced asthma and RLE DVT Jan. 2020 provoked by oral birth control pills.  S/P umbilical hernia repair.  She presents to our office today as referred by her gynecologist Dr. Donavan Foil for further evaluation regarding burning rectal pain and rectal bleeding.  She delivered her baby daughter vaginally on 04/20/2020.  She reported having a second-degree episiotomy which healed without difficulty.  However, she took oxycodone postpartum for few days which resulted in significant constipation.  She went 1 week without passing a bowel movement then sat on the commode for 1 to 2 hours and pushed a very difficult painful stool.  She describes feeling a ripping sensation inside her anus.  She saw bright red blood on the toilet tissue and drops of blood in the toilet water.  She and her husband attempted anal intercourse recently on one occasion which was unsuccessful and she is concerned this caused worsening rectal bleeding.  She started taking MiraLAX for the past 2 weeks and her rectal discomfort and bleeding have significantly improved.  She lasts saw blood on the toilet tissue and in the toilet water 4 or 5 days ago.  She is breast-feeding her baby daughter.  No family history of colorectal cancer.  CBC Latest Ref Rng & Units 04/23/2020 04/19/2020 01/29/2020  WBC 4.0 - 10.5 K/uL 13.1(H) 10.5 10.8  Hemoglobin 12.0 - 15.0 g/dL 60.7 37.1 06.2  Hematocrit 36.0 - 46.0 % 39.4 40.6 38.5  Platelets 150 - 400 K/uL 189 178 202   CMP Latest Ref Rng & Units 09/26/2019 07/18/2018 11/21/2013  Glucose 70 - 99 mg/dL 86 694(W) 546(E)  BUN 6 - 20 mg/dL 8 9 9   Creatinine 0.44 - 1.00 mg/dL 7.03 5.00  Sodium 135 - 145 mmol/L 136 139 139  Potassium 3.5 - 5.1 mmol/L 3.8 4.3 4.1  Chloride 98 - 111 mmol/L 105 103 106   CO2 22 - 32 mmol/L 21(L) 19(L) 21  Calcium 8.9 - 10.3 mg/dL 9.38) 9.4 9.2  Total Protein 6.5 - 8.1 g/dL 6.9 - 7.5  Total Bilirubin 0.3 - 1.2 mg/dL 0.3 - 0.3  Alkaline Phos 38 - 126 U/L 30(L) - 56  AST 15 - 41 U/L 16 - 16  ALT 0 - 44 U/L 24 - 13     Past Medical History:  Diagnosis Date  . Asthma    exercise induced  . BV (bacterial vaginosis)   . DVT (deep venous thrombosis) (HCC)    provoked by [facility] OCPs   . 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 Pelvic floor dysfunction   . Yeast infection    Past Surgical History:  Procedure Laterality Date  . ABDOMINAL HERNIA REPAIR  age 23  . INDUCED ABORTION     Social History: She is married. She drinks one or two glasses of wine 2 to 3 days weekly. Past marijuana use.   Family History: Mother  46 with thyroid disease. Father age 44 healthy. 4 brothers and 1 sister are healthy.    Allergies  Allergen Reactions  . Peanuts [Peanut Oil] Anaphylaxis and Hives      Outpatient Encounter Medications as of 06/24/2020  Medication Sig  . cetirizine (ZYRTEC) 5 MG tablet Take 1 tablet (5 mg total) by mouth daily.  . coconut oil  OIL Apply 1 application topically as needed.  . cyclobenzaprine (FLEXERIL) 10 MG tablet Take 1 tablet (10 mg total) by mouth every 8 (eight) hours as needed for muscle spasms.  . polyethylene glycol (MIRALAX) 17 g packet Take 17 g by mouth daily.  . prenatal vitamin w/FE, FA (PRENATAL 1 + 1) 27-1 MG TABS tablet Take 1 tablet by mouth daily at 12 noon. (Patient not taking: Reported on 06/12/2020)   No facility-administered encounter medications on file as of 06/24/2020.     REVIEW OF SYSTEMS: Gen: Denies fever, sweats or chills. No weight loss.  CV: Denies chest pain, palpitations or edema. Resp: Denies cough, shortness of breath of hemoptysis.  GI: See HPI.  Denies heartburn, dysphagia, stomach or lower abdominal pain. GU : Denies urinary burning, blood in urine, increased urinary  frequency or incontinence. MS: Denies joint pain, muscles aches or weakness. Derm: Denies rash, itchiness, skin lesions or unhealing ulcers. Psych: Denies depression or anxiety. Heme: Denies bruising, bleeding. Neuro:  Denies headaches, dizziness or paresthesias. Endo:  Denies any problems with DM, thyroid or adrenal function.   PHYSICAL EXAM: Ht 5\' 5"  (1.651 m)   BMI 28.32 kg/m   LMP 06/17/2020 General: Well developed  24 year old female in no acute distress. Head: Normocephalic and atraumatic. Eyes:  Sclerae non-icteric, conjunctive pink. Ears: Normal auditory acuity. Mouth: Dentition intact. No ulcers or lesions.  Neck: Supple, no lymphadenopathy or thyromegaly.  Lungs: Clear bilaterally to auscultation without wheezes, crackles or rhonchi. Heart: Regular rate and rhythm. No murmur, rub or gallop appreciated.  Abdomen: Soft, nontender, non distended. No masses. No hepatosplenomegaly. Normoactive bowel sounds x 4 quadrants.  Rectal: No external hemorrhoids, small internal hemorrhoids palpated without prolapse or active bleeding.  A thin superficial posterior fissure present.  No blood or stool in the rectal vault.  CMA 30 present during exam. Musculoskeletal: Symmetrical with no gross deformities. Skin: Warm and dry. No rash or lesions on visible extremities. Extremities: No edema. Neurological: Alert oriented x 4, no focal deficits.  Psychological:  Alert and cooperative. Normal mood and affect.  ASSESSMENT AND PLAN:  21.  24 year old female with rectal bleeding.  Rectal exam today showed a thin posterior fissure and small internal hemorrhoids without active bleeding. -She is currently breast-feeding her infant daughter therefore I advised the patient to use Desitin apply a small amount inside the anal opening into the external anal area 3 times daily for the next few weeks -Continue MiraLAX nightly -Patient to follow-up in the office with Dr. 30 in 6 weeks to discuss  scheduling a flexible sigmoidoscopy versus colonoscopy -CBC -Patient to call our office if her symptoms worsen  2.  History of DVT on oh BPs.   IUD placement scheduled on 06/30/2021.    CC:  07/02/2021, MD

## 2020-06-24 NOTE — Patient Instructions (Addendum)
Your provider has requested that you go to the basement level for lab work before leaving today. Press "B" on the elevator. The lab is located at the first door on the left as you exit the elevator.   Purchase Desitin over the counter and apply a small amount inside the anal opening and external anal area three times a day x 6 weeks.   We have sent the following medications to your pharmacy for you to pick up at your convenience: Miralax  Call our office if your symptoms get worse.

## 2020-06-25 NOTE — Progress Notes (Signed)
____________________________________________________________  Attending physician addendum:  Thank you for sending this case to me. I have reviewed the entire note and agree with the plan.  Fissure should heal with time, miralax and as-needed use of recticare ointment if painful BMs  Amada Jupiter, MD  ____________________________________________________________

## 2020-07-02 NOTE — BH Specialist Note (Signed)
Integrated Behavioral Health via Telemedicine Visit  07/02/2020 Marcee Jacobs [id number]    Pt did not arrive to video visit and did not answer the phone ; Left HIPPA-compliant message to call back Asher Muir from [facility] [employer] at [facility] at [phone] (main office) or [phone] (Darcy Cordner's office).  ; left MyChart message for patient.

## 2020-07-11 ENCOUNTER — Ambulatory Visit: Payer: Managed Care, Other (non HMO) | Admitting: Clinical

## 2020-07-11 DIAGNOSIS — Z5329 Procedure and treatment not carried out because of patient's decision for other reasons: Secondary | ICD-10-CM

## 2020-07-11 DIAGNOSIS — Z91199 Patient's noncompliance with other medical treatment and regimen due to unspecified reason: Secondary | ICD-10-CM

## 2020-08-12 ENCOUNTER — Ambulatory Visit: Payer: Managed Care, Other (non HMO) | Admitting: Gastroenterology

## 2020-08-12 NOTE — Progress Notes (Deleted)
      GI Progress Note  Chief Complaint: ***  Subjective  History: Olivia Hayes follows up after office visit in December for posterior anal fissure, which occurred from postpartum constipation.  She had a child by vaginal delivery in October, episiotomy required. Constipation was reportedly improving on MiraLAX, no nitroglycerin or diltiazem ointment was prescribed since she is breast-feeding. ***  ROS: Cardiovascular:  no chest pain Respiratory: no dyspnea  The patient's Past Medical, Family and Social History were reviewed and are on file in the EMR.  Objective:  Med list reviewed  Current Outpatient Medications:  .  cetirizine (ZYRTEC) 5 MG tablet, Take 1 tablet (5 mg total) by mouth daily., Disp: 90 tablet, Rfl: 1 .  coconut oil OIL, Apply 1 application topically as needed., Disp: , Rfl: 0 .  polyethylene glycol (MIRALAX) 17 g packet, Take 17 g by mouth daily., Disp: 30 each, Rfl: 3 .  prenatal vitamin w/FE, FA (PRENATAL 1 + 1) 27-1 MG TABS tablet, Take 1 tablet by mouth daily at 12 noon. (Patient not taking: No sig reported), Disp: 30 tablet, Rfl: 11   Vital signs in last 24 hrs: There were no vitals filed for this visit. Wt Readings from Last 3 Encounters:  06/24/20 172 lb (78 kg)  06/12/20 170 lb 3.2 oz (77.2 kg)  04/23/20 173 lb 7 oz (78.7 kg)    Physical Exam  ***  HEENT: sclera anicteric, oral mucosa moist without lesions  Neck: supple, no thyromegaly, JVD or lymphadenopathy  Cardiac: RRR without murmurs, S1S2 heard, no peripheral edema  Pulm: clear to auscultation bilaterally, normal RR and effort noted  Abdomen: soft, *** tenderness, with active bowel sounds. No guarding or palpable hepatosplenomegaly.  Skin; warm and dry, no jaundice or rash  Labs:   ___________________________________________ Radiologic studies:   ____________________________________________ Other:   _____________________________________________ Assessment & Plan   Assessment: No diagnosis found.    Plan:   *** minutes were spent on this encounter (including chart review, history/exam, counseling/coordination of care, and documentation) > 50% of that time was spent on counseling and coordination of care.  Topics discussed included: ***.  Charlie Pitter III

## 2020-09-11 ENCOUNTER — Other Ambulatory Visit: Payer: Self-pay

## 2020-09-12 ENCOUNTER — Ambulatory Visit (INDEPENDENT_AMBULATORY_CARE_PROVIDER_SITE_OTHER): Payer: Managed Care, Other (non HMO) | Admitting: Family Medicine

## 2020-09-12 ENCOUNTER — Encounter: Payer: Self-pay | Admitting: Family Medicine

## 2020-09-12 VITALS — BP 122/74 | HR 70 | Temp 98.4°F | Ht 64.0 in | Wt 171.4 lb

## 2020-09-12 DIAGNOSIS — R41840 Attention and concentration deficit: Secondary | ICD-10-CM

## 2020-09-12 DIAGNOSIS — Z86718 Personal history of other venous thrombosis and embolism: Secondary | ICD-10-CM

## 2020-09-12 DIAGNOSIS — O99345 Other mental disorders complicating the puerperium: Secondary | ICD-10-CM

## 2020-09-12 DIAGNOSIS — J3089 Other allergic rhinitis: Secondary | ICD-10-CM | POA: Diagnosis not present

## 2020-09-12 DIAGNOSIS — J302 Other seasonal allergic rhinitis: Secondary | ICD-10-CM | POA: Insufficient documentation

## 2020-09-12 DIAGNOSIS — F53 Postpartum depression: Secondary | ICD-10-CM

## 2020-09-12 DIAGNOSIS — L732 Hidradenitis suppurativa: Secondary | ICD-10-CM

## 2020-09-12 DIAGNOSIS — I868 Varicose veins of other specified sites: Secondary | ICD-10-CM

## 2020-09-12 MED ORDER — PAROXETINE HCL 20 MG PO TABS: 20.0000 mg | ORAL_TABLET | Freq: Every day | ORAL | 5 refills | Status: DC

## 2020-09-12 NOTE — Patient Instructions (Signed)
Recommend appointment with Ms. Cottle for counseling.

## 2020-09-12 NOTE — Progress Notes (Signed)
[facility] [address] [place] [address] Dept: [phone] Dept Fax: [phone]  New Patient Office Visit  Subjective:    Patient ID: Olivia Hayes, female    DOB: March 14, 1996, 25 y.o..   MRN: [id number]  Chief Complaint  Patient presents with  . Establish Care    NP- CPE/labs, no concerns.  Declines both covid and flu shots.     History of Present Illness:  Patient is in today to establish care. Olivia Hayes is married and has a 5 mo daughter Olivia Hayes). She ahd had a right DVT 2 years ago, associated with birth control. During her pregnancy she was managed on Lovenox for DVT prophylaxis. She delivered at [redacted] weeks gestation after induction of labor. Her daughter was 7# 1.6 oz. Olivia Hayes had a 2nd degree tear that has healed well. She breast fed for the first 3 months, but now is formula feeding her infant. She is about 6 lbs away from her pre-pregnancy weight.  Olivia Hayes has a history of allergic rhinitis. She notes this is mostly in reaction to dust, cat dander, and sometimes weather changes. She uses cetrizine episodically.  Olivia Alviar notes a concern for possible ADHD. She notes that she struggled in school with inattentiveness and impulsivity. Her teachers would bring this up with her parents, but they were not willing to have this further evaluated or treated. Now as an adult, Olivia Hayes finds that she has difficulty with focusing, esp. around ongoing school work. This seems a bit more pronounced since the delivery of her baby.  Olivia Hayes notes several small varicose veins on her right foot. She notes that he mother has had issues with varicose veins as well. She episodically gets pain in her right foot and calf area, which is the same side as her previous DVT.  Olivia Hayes notes that she has issues with ingrown hairs int he groin area and down onto the upper thigh. She has laser hair removal performed and  the ingrown hairs can sometimes be a problem. She asks about a referral to dermatology to assess.  Olivia Hayes notes she has struggled with angry outbursts since her delivery. She became tearful as she discussed the emotional issues she is dealing with. She admits that her motivation for things like exercise has been difficult. She also talks about how hard it is being a new mother and also dealing with marital issues. She notes her mother had depression issues, possibly with some psychotic features. She wants to avoid exposing her daughter to similar issues. She admits to occasional thoughts of just disappearing, but no active suicidality.  Past Medical History: Patient Active Problem List   Diagnosis Date Noted  . Seasonal allergic rhinitis 09/12/2020  . Rectal bleeding 06/12/2020  . Vaginal delivery 04/21/2020  . History of DVT (deep vein thrombosis) 08/24/2019  . Herpes simplex 10/15/2018  . Inattention 01/06/2018  . Globus sensation 07/11/2015  . Acne 03/28/2013  . Multiple allergies 02/21/2013   Past Surgical History:  Procedure Laterality Date  . ABDOMINAL HERNIA REPAIR  age 64  . INDUCED ABORTION     Family History  Problem Relation Age of Onset  . Thyroid disease Mother   . Colon cancer Neg Hx   . Pancreatic cancer Neg Hx   . Esophageal cancer Neg Hx   . Stomach cancer Neg Hx   . Rectal cancer Neg Hx   . Liver cancer Neg Hx    Outpatient Medications Prior  to Visit  Medication Sig Dispense Refill  . cetirizine (ZYRTEC) 5 MG tablet Take 1 tablet (5 mg total) by mouth daily. 90 tablet 1  . polyethylene glycol (MIRALAX) 17 g packet Take 17 g by mouth daily. 30 each 3  . prenatal vitamin w/FE, FA (PRENATAL 1 + 1) 27-1 MG TABS tablet Take 1 tablet by mouth daily at 12 noon. (Patient not taking: No sig reported) 30 tablet 11  . coconut oil OIL Apply 1 application topically as needed. (Patient not taking: Reported on 09/12/2020)  0   No facility-administered medications prior  to visit.   Allergies  Allergen Reactions  . Peanuts [Peanut Oil] Anaphylaxis and Hives     Objective:   Today's Vitals   09/12/20 1051  BP: 122/74  Pulse: 70  Temp: 98.4 F (36.9 C)  TempSrc: Oral  SpO2: 98%  Weight: 171 lb 6.4 oz (77.7 kg)  Height: 5\' 4"  (1.626 m)   Body mass index is 29.42 kg/m.   General: Well developed, well nourished. No acute distress. Extremities: No pain on compressionof the calf. No edema noted. There are a few spirder varicose veins over the   medial aspect of the right foot. Psych: Alert and oriented. Normal mood and affect, but became tearful while discussing mood changes and stressors..  Health Maintenance Due  Topic Date Due  . COVID-19 Vaccine (1) Never done  . INFLUENZA VACCINE  02/03/2020  . PAP-Cervical Cytology Screening  10/31/2020  . PAP SMEAR-Modifier  10/31/2020     Assessment & Plan:   1. Postpartum depression Olivia. Hayes appears ot be having postpartum depression, partially exhibited by emotional lability and anger outbursts. I recommended she seek about counseling with Bambi Cottle. I will also start her on Paxil I will plan to see her back in 6 weeks for reassessment.  - PARoxetine (PAXIL) 20 MG tablet; Take 1 tablet (20 mg total) by mouth daily.  Dispense: 30 tablet; Refill: 5  2. Inattention This may represent ADHD that has continued since childhood and now impacts her as an adult. However, currently there could be overlay with depression and the stressors of being a new parent. I will refer her to psychiatry for furether evaluation and guidance on management.  - Ambulatory referral to Psychiatry  3. History of DVT (deep vein thrombosis) I do not see any current sign of a DVT in the leg. The occasional pains may be due to sequelae related to her past DVT. As these are not long lasting and there is no sign of leg edema, I recommend observing this for now.  4. Non-seasonal allergic rhinitis due to other allergic  trigger Continue PRN cetirizine use.  5. Spider varicose veins I do not feel the varicose veins are causing the type of pain Olivia. Hayes describes. She is not interested in cosmetic procedures for this.  6. Hidradenitis The description of the rash in her groin, sounds like hidradenitis. She prefers to see dermatology for further assessment.  - Ambulatory referral to Dermatology   11/02/2020, MD

## 2020-09-25 ENCOUNTER — Other Ambulatory Visit: Payer: Self-pay | Admitting: Obstetrics & Gynecology

## 2020-09-29 ENCOUNTER — Other Ambulatory Visit: Payer: Self-pay | Admitting: Family Medicine

## 2020-09-29 DIAGNOSIS — O99345 Other mental disorders complicating the puerperium: Secondary | ICD-10-CM

## 2020-09-29 DIAGNOSIS — F53 Postpartum depression: Secondary | ICD-10-CM

## 2020-10-09 ENCOUNTER — Encounter: Payer: Self-pay | Admitting: Family Medicine

## 2020-10-20 ENCOUNTER — Ambulatory Visit (INDEPENDENT_AMBULATORY_CARE_PROVIDER_SITE_OTHER): Payer: Managed Care, Other (non HMO) | Admitting: Family Medicine

## 2020-10-20 ENCOUNTER — Other Ambulatory Visit: Payer: Self-pay

## 2020-10-20 ENCOUNTER — Encounter: Payer: Self-pay | Admitting: Family Medicine

## 2020-10-20 VITALS — BP 118/66 | HR 105 | Temp 97.8°F | Ht 64.0 in | Wt 179.4 lb

## 2020-10-20 DIAGNOSIS — Z3A01 Less than 8 weeks gestation of pregnancy: Secondary | ICD-10-CM

## 2020-10-20 DIAGNOSIS — F53 Postpartum depression: Secondary | ICD-10-CM

## 2020-10-20 DIAGNOSIS — N926 Irregular menstruation, unspecified: Secondary | ICD-10-CM | POA: Insufficient documentation

## 2020-10-20 DIAGNOSIS — O99345 Other mental disorders complicating the puerperium: Secondary | ICD-10-CM | POA: Diagnosis not present

## 2020-10-20 LAB — POCT URINE PREGNANCY: Preg Test, Ur: POSITIVE — AB
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
## 2020-10-20 NOTE — Patient Instructions (Signed)
Obstetrics: Normal and Problem Pregnancies (7th ed., pp. 102-121). [place], [place]: Elsevier."> Textbook of Family Medicine (9th ed., pp. 365-410). [place], [place]: Else[employer].">  First Trimester of Pregnancy  The first trimester of pregnancy starts on the first day of your last menstrual period until the end of week 12. This is months 1 through 3 of pregnancy. A week after a sperm fertilizes an egg, the egg will implant into the wall of the uterus and begin to develop into a baby. By the end of 12 weeks, all the baby's organs will be formed and the baby will be 2-3 inches in size. Body changes during your first trimester Your body goes through many changes during pregnancy. The changes vary and generally return to normal after your baby is born. Physical changes  You may gain or lose weight.  Your breasts may begin to grow larger and become tender. The tissue that surrounds your nipples (areola) may become darker.  Dark spots or blotches (chloasma or mask of pregnancy) may develop on your face.  You may have changes in your hair. These can include thickening or thinning of your hair or changes in texture. Health changes  You may feel nauseous, and you may vomit.  You may have heartburn.  You may develop headaches.  You may develop constipation.  Your gums may bleed and may be sensitive to brushing and flossing. Other changes  You may tire easily.  You may urinate more often.  Your menstrual periods will stop.  You may have a loss of appetite.  You may develop cravings for certain kinds of food.  You may have changes in your emotions from day to day.  You may have more vivid and strange dreams. Follow these instructions at home: Medicines  Follow your health care provider's instructions regarding medicine use. Specific medicines may be either safe or unsafe to take during pregnancy. Do not take any medicines unless told to by your health care provider.  Take a  prenatal vitamin that contains at least 600 micrograms (mcg) of folic acid. Eating and drinking  Eat a healthy diet that includes fresh fruits and vegetables, whole grains, good sources of protein such as meat, eggs, or tofu, and low-fat dairy products.  Avoid raw meat and unpasteurized juice, milk, and cheese. These carry germs that can harm you and your baby.  If you feel nauseous or you vomit: ? Eat 4 or 5 small meals a day instead of 3 large meals. ? Try eating a few soda crackers. ? Drink liquids between meals instead of during meals.  You may need to take these actions to prevent or treat constipation: ? Drink enough fluid to keep your urine pale yellow. ? Eat foods that are high in fiber, such as beans, whole grains, and fresh fruits and vegetables. ? Limit foods that are high in fat and processed sugars, such as fried or sweet foods. Activity  Exercise only as directed by your health care provider. Most people can continue their usual exercise routine during pregnancy. Try to exercise for 30 minutes at least 5 days a week.  Stop exercising if you develop pain or cramping in the lower abdomen or lower back.  Avoid exercising if it is very hot or humid or if you are at high altitude.  Avoid heavy lifting.  If you choose to, you may have sex unless your health care provider tells you not to. Relieving pain and discomfort  Wear a good support bra to relieve breast   tenderness.  Rest with your legs elevated if you have leg cramps or low back pain.  If you develop bulging veins (varicose veins) in your legs: ? Wear support hose as told by your health care provider. ? Elevate your feet for 15 minutes, 3-4 times a day. ? Limit salt in your diet. Safety  Wear your seat belt at all times when driving or riding in a car.  Talk with your health care provider if someone is verbally or physically abusive to you.  Talk with your health care provider if you are feeling sad or have  thoughts of hurting yourself. Lifestyle  Do not use hot tubs, steam rooms, or saunas.  Do not douche. Do not use tampons or scented sanitary pads.  Do not use herbal remedies, alcohol, illegal drugs, or medicines that are not approved by your health care provider. Chemicals in these products can harm your baby.  Do not use any products that contain nicotine or tobacco, such as cigarettes, e-cigarettes, and chewing tobacco. If you need help quitting, ask your health care provider.  Avoid cat litter boxes and soil used by cats. These carry germs that can cause birth defects in the baby and possibly loss of the unborn baby (fetus) by miscarriage or stillbirth. General instructions  During routine prenatal visits in the first trimester, your health care provider will do a physical exam, perform necessary tests, and ask you how things are going. Keep all follow-up visits. This is important.  Ask for help if you have counseling or nutritional needs during pregnancy. Your health care provider can offer advice or refer you to specialists for help with various needs.  Schedule a dentist appointment. At home, brush your teeth with a soft toothbrush. Floss gently.  Write down your questions. Take them to your prenatal visits. Where to find more information  American Pregnancy Association: americanpregnancy.org  American College of Obstetricians and Gynecologists: acog.org/en/Womens%20Health/Pregnancy  Office on Women's Health: womenshealth.gov/pregnancy Contact a health care provider if you have:  Dizziness.  A fever.  Mild pelvic cramps, pelvic pressure, or nagging pain in the abdominal area.  Nausea, vomiting, or diarrhea that lasts for 24 hours or longer.  A bad-smelling vaginal discharge.  Pain when you urinate.  Known exposure to a contagious illness, such as chickenpox, measles, Zika virus, HIV, or hepatitis. Get help right away if you have:  Spotting or bleeding from your  vagina.  Severe abdominal cramping or pain.  Shortness of breath or chest pain.  Any kind of trauma, such as from a fall or a car crash.  New or increased pain, swelling, or redness in an arm or leg. Summary  The first trimester of pregnancy starts on the first day of your last menstrual period until the end of week 12 (months 1 through 3).  Eating 4 or 5 small meals a day rather than 3 large meals may help to relieve nausea and vomiting.  Do not use any products that contain nicotine or tobacco, such as cigarettes, e-cigarettes, and chewing tobacco. If you need help quitting, ask your health care provider.  Keep all follow-up visits. This is important. This information is not intended to replace advice given to you by your health care provider. Make sure you discuss any questions you have with your health care provider. Document Revised: 11/28/2019 Document Reviewed: 10/04/2019 Elsevier Patient Education  2021 Elsevier Inc.  

## 2020-10-22 ENCOUNTER — Encounter: Payer: Self-pay | Admitting: Family Medicine

## 2020-10-22 DIAGNOSIS — J3089 Other allergic rhinitis: Secondary | ICD-10-CM

## 2020-10-22 MED ORDER — CETIRIZINE HCL 5 MG PO TABS: 5.0000 mg | ORAL_TABLET | Freq: Every day | ORAL | 1 refills | Status: DC

## 2020-10-22 NOTE — Telephone Encounter (Signed)
I called and spoke with Olivia Hayes. She notes she has decided to move ahead with termination of the pregnancy. [facility] to [facility] about [facility]. She asked about how her prior history of a DVT impacts whether she can take a medication to induce an abortion.   I explained that although this is a very good question, it is outside of my expertise. I recommended she get in touch with her OB-GYN to discuss further.  She requests a refill of her Zyrtec for allergy flares she is currently having. I will send in that prescription.

## 2020-10-22 NOTE — Telephone Encounter (Signed)
Pt called to follow up on this to make sure it went through, I told her it did and that someone would be in contact with her

## 2020-10-22 NOTE — Addendum Note (Signed)
Addended by: Loyola Mast on: 10/22/2020 04:28 PM   Modules accepted: Orders

## 2020-10-27 ENCOUNTER — Ambulatory Visit: Payer: Managed Care, Other (non HMO) | Admitting: Family Medicine

## 2020-12-10 LAB — HM PAP SMEAR: HM Pap smear: NORMAL

## 2020-12-22 ENCOUNTER — Encounter: Payer: Self-pay | Admitting: Family Medicine

## 2020-12-22 ENCOUNTER — Ambulatory Visit (INDEPENDENT_AMBULATORY_CARE_PROVIDER_SITE_OTHER): Payer: Managed Care, Other (non HMO) | Admitting: Family Medicine

## 2020-12-22 ENCOUNTER — Other Ambulatory Visit: Payer: Self-pay

## 2020-12-22 VITALS — BP 116/70 | HR 76 | Temp 97.2°F | Ht 64.0 in | Wt 170.4 lb

## 2020-12-22 DIAGNOSIS — F53 Postpartum depression: Secondary | ICD-10-CM

## 2020-12-22 DIAGNOSIS — Z975 Presence of (intrauterine) contraceptive device: Secondary | ICD-10-CM

## 2020-12-22 NOTE — Progress Notes (Signed)
[facility] [address] [place] [address] Dept: [phone] Dept Fax: [phone]  Office Visit  Subjective:    Patient ID: Olivia Hayes, female    DOB: 10-Aug-1995, 25 y.o..   MRN: [id number]  Chief Complaint  Patient presents with   Follow-up    2 month f/u depression. Reports things going good.     History of Present Illness:  Patient is in today for reassessment of her postpartum depression. Olivia Hayes was last seen on 4/18. At that point, she had an early pregnancy. She and her husband agreed to terminate the pregnancy. She has recovered well from this. She had a copper IUD placed. She notes her first menses after this was a bit heavier.  Olivia Hayes feels her mood is doing well at this point. She denies any emotional swings or tearfulness. She is bonding well with her daughter and fairly well with her step-son.  She has an appointment with her OB-GYN on Friday. Se will address her pap smear needs at that time.  Past Medical History: Patient Active Problem List   Diagnosis Date Noted   Intrauterine device 12/22/2020   Seasonal allergic rhinitis 09/12/2020   Rectal bleeding 06/12/2020   History of DVT (deep vein thrombosis) 08/24/2019   Herpes simplex 10/15/2018   Inattention 01/06/2018   Globus sensation 07/11/2015   Acne 03/28/2013   Multiple allergies 02/21/2013   Past Surgical History:  Procedure Laterality Date   ABDOMINAL HERNIA REPAIR  age 65   INDUCED ABORTION     Family History  Problem Relation Age of Onset   Thyroid disease Mother    Colon cancer Neg Hx    Pancreatic cancer Neg Hx    Esophageal cancer Neg Hx    Stomach cancer Neg Hx    Rectal cancer Neg Hx    Liver cancer Neg Hx    Outpatient Medications Prior to Visit  Medication Sig Dispense Refill   cetirizine (ZYRTEC) 5 MG tablet Take 1 tablet (5 mg total) by mouth daily. 90 tablet 1   Multiple Vitamin (MULTIVITAMIN) tablet Take  1 tablet by mouth daily.     paragard intrauterine copper IUD IUD 1 each by Intrauterine route once.     PARoxetine (PAXIL) 20 MG tablet TAKE 1 TABLET BY MOUTH EVERY DAY (Patient not taking: No sig reported) 90 tablet 1   polyethylene glycol (MIRALAX) 17 g packet Take 17 g by mouth daily. (Patient not taking: Reported on 12/22/2020) 30 each 3   prenatal vitamin w/FE, FA (PRENATAL 1 + 1) 27-1 MG TABS tablet Take 1 tablet by mouth daily at 12 noon. (Patient not taking: No sig reported) 30 tablet 11   No facility-administered medications prior to visit.   Allergies  Allergen Reactions   Peanuts [Peanut Oil] Anaphylaxis and Hives    Objective:   Today's Vitals   12/22/20 1303  BP: 116/70  Pulse: 76  Temp: (!) 97.2 F (36.2 C)  TempSrc: Temporal  SpO2: 98%  Weight: 170 lb 6.4 oz (77.3 kg)  Height: 5\' 4"  (1.626 m)   Body mass index is 29.25 kg/m.   General: Well developed, well nourished. No acute distress. Psych: Alert and oriented x3. Normal mood and affect.  Health Maintenance Due  Topic Date Due   COVID-19 Vaccine (1) Never done   PAP-Cervical Cytology Screening  10/31/2020   PAP SMEAR-Modifier  10/31/2020     Assessment & Plan:   1. Postpartum depression Resolved. I see  no need to restart medication at this point. I encouraged her to remain physically active.  2. Intrauterine device Placed ~ 11/02/2020. We discussed that heavier bleeding likely was due to the presence of the IUD. This should improve with continued use. This contraceptive choice is best in light of her prior issue with a DVT.  Loyola Mast, MD

## 2020-12-25 ENCOUNTER — Encounter: Payer: Self-pay | Admitting: Student

## 2020-12-25 ENCOUNTER — Other Ambulatory Visit (HOSPITAL_COMMUNITY)
Admission: RE | Admit: 2020-12-25 | Discharge: 2020-12-25 | Disposition: A | Discharge status: Home / Self Care | Payer: Managed Care, Other (non HMO) | Source: Ambulatory Visit | Attending: Student | Admitting: Student

## 2020-12-25 ENCOUNTER — Ambulatory Visit (INDEPENDENT_AMBULATORY_CARE_PROVIDER_SITE_OTHER): Payer: Managed Care, Other (non HMO) | Admitting: Student

## 2020-12-25 ENCOUNTER — Other Ambulatory Visit: Payer: Self-pay

## 2020-12-25 VITALS — BP 108/75 | HR 91 | Wt 170.0 lb

## 2020-12-25 DIAGNOSIS — Z975 Presence of (intrauterine) contraceptive device: Secondary | ICD-10-CM | POA: Insufficient documentation

## 2020-12-25 DIAGNOSIS — Z7251 High risk heterosexual behavior: Secondary | ICD-10-CM | POA: Diagnosis not present

## 2020-12-25 DIAGNOSIS — Z30431 Encounter for routine checking of intrauterine contraceptive device: Secondary | ICD-10-CM | POA: Diagnosis not present

## 2020-12-25 DIAGNOSIS — Z124 Encounter for screening for malignant neoplasm of cervix: Secondary | ICD-10-CM

## 2020-12-25 DIAGNOSIS — Z01411 Encounter for gynecological examination (general) (routine) with abnormal findings: Secondary | ICD-10-CM | POA: Diagnosis present

## 2020-12-25 DIAGNOSIS — A5901 Trichomonal vulvovaginitis: Secondary | ICD-10-CM | POA: Insufficient documentation

## 2020-12-25 DIAGNOSIS — Z113 Encounter for screening for infections with a predominantly sexual mode of transmission: Secondary | ICD-10-CM | POA: Diagnosis present

## 2020-12-25 DIAGNOSIS — N920 Excessive and frequent menstruation with regular cycle: Secondary | ICD-10-CM | POA: Diagnosis not present

## 2020-12-25 DIAGNOSIS — Z86718 Personal history of other venous thrombosis and embolism: Secondary | ICD-10-CM | POA: Diagnosis not present

## 2020-12-25 DIAGNOSIS — N939 Abnormal uterine and vaginal bleeding, unspecified: Secondary | ICD-10-CM | POA: Diagnosis not present

## 2020-12-25 NOTE — Progress Notes (Signed)
Patient is here because she is due for a pap smear and wants to discuss IUD that was inserted 09/2020. Patient stated that her menstrual bleeding has increased along with discomfort during intercourse   Aarvi Stotts, CMA

## 2020-12-25 NOTE — Progress Notes (Signed)
Patient ID: Olivia Hayes, female   DOB: [birth year], 25 y.o.   MRN: [id number]  History:  Ms. Alaura Schippers is a 25 y.o. [id number] who presents to clinic today for IUD check up. She had IUD placed after a TAB in May. Her history is significant for DVT after taking Yaz; she also admits she was using tobacco products at that time.   She also wants STI testing because she reports some brown spotting and discharge; occasional pelvic pain. She states that her period is heavier than usual since the IUD placement in May. Occasionally she has stomach cramps. She does not want it removed as she is concerned about blood clots with other hormonal methods. She does not feel comfortable with progesterone-only methods for [facility] or to help with bleeding.   The following portions of the patient's history were reviewed and updated as appropriate: allergies, current medications, family history, past medical history, social history, past surgical history and problem list.  Review of Systems:  Review of Systems  Constitutional: Negative.   HENT: Negative.    Eyes: Negative.   Respiratory: Negative.    Cardiovascular: Negative.   Gastrointestinal: Negative.   Genitourinary: Negative.   Skin: Negative.      Objective:  Physical Exam BP 108/75   Pulse 91   Wt 170 lb (77.1 kg)   LMP 12/03/2020 (Exact Date)   Breastfeeding No   BMI 29.18 kg/m  Physical Exam Constitutional:      Appearance: Normal appearance.  HENT:     Head: Normocephalic.  Genitourinary:    General: Normal vulva.     Vagina: No vaginal discharge.  Skin:    General: Skin is warm.     Capillary Refill: Capillary refill takes less than 2 seconds.  Neurological:     General: No focal deficit present.     Mental Status: She is alert and oriented to person, place, and time.   Cervix is pink with no lesions, no bleeding from Pap. IUD strings visualized, no CMT, no tenderness on exam.   Labs and Imaging No results found for this or any  previous visit (from the past 24 hour(s)).  No results found.  Health Maintenance Due  Topic Date Due   COVID-19 Vaccine (1) Never done   PAP-Cervical Cytology Screening  10/31/2020   PAP SMEAR-Modifier  10/31/2020     Assessment & Plan:   1. Cervical cancer screening   2. IUD check up   -pap smear performed as well as wet prep to check for STI -suggested trial of NSAIDS 800 mg three times a day for three days as soon as period starts. Patient is not interested in any type of hormonal method (even progesterone-only) to combat her heavy menses; she is "very worried" about anotehr blood clot. At this point, NSAIDS are an appropriate option.     Approximately 20 minutes of total time was spent with this patient on counseling and examination.   Marylene Land, CNM 12/25/2020 4:12 PM

## 2020-12-25 NOTE — Patient Instructions (Signed)
-  try taking 800 mg of ibuprofen three times a day starting the day before your period starts, or as soon as your period starts. Continue for 2 more days. If you start right when your period starts, hopefully you will be able to stop the bleeding before it gets bad.   Olivia Hayes

## 2020-12-26 LAB — CERVICOVAGINAL ANCILLARY ONLY
Bacterial Vaginitis (gardnerella): NEGATIVE
Candida Glabrata: NEGATIVE
Candida Vaginitis: NEGATIVE
Chlamydia: NEGATIVE
Comment: NEGATIVE
Comment: NEGATIVE
Comment: NEGATIVE
Comment: NEGATIVE
Comment: NEGATIVE
Comment: NORMAL
Neisseria Gonorrhea: NEGATIVE
Trichomonas: POSITIVE — AB

## 2020-12-28 ENCOUNTER — Encounter: Payer: Self-pay | Admitting: Student

## 2020-12-28 ENCOUNTER — Other Ambulatory Visit: Payer: Self-pay | Admitting: Student

## 2020-12-28 DIAGNOSIS — A599 Trichomoniasis, unspecified: Secondary | ICD-10-CM | POA: Insufficient documentation

## 2020-12-28 MED ORDER — METRONIDAZOLE 500 MG PO TABS: 2000.0000 mg | ORAL_TABLET | Freq: Once | ORAL | 0 refills | Status: AC

## 2020-12-29 ENCOUNTER — Telehealth: Payer: Self-pay

## 2020-12-29 LAB — CYTOLOGY - PAP: Diagnosis: NEGATIVE

## 2020-12-29 NOTE — Telephone Encounter (Signed)
Called patient and she is not able to speak at this time and requested a call back in about 10 minutes. She reports she does have more questions even after reading My Chart message.   Returned patients call. She is reporting that she is having a lot of anxiety and concerns about + Trich. She reports she is married and they were separated for a while and then got back together before getting pregnant.   Reviewed with patient that testing in Feb 2021 was negative for Trich. Reviewed that people can carry Trich for months and/or years without symptoms and without knowing they have it.   Patient reports her questions were answered . Husband has an appointment with his doctor today to be treated.

## 2020-12-29 NOTE — Telephone Encounter (Signed)
Patient called in wanting to speak with a nurse about her test results, she is very worked up and wanting to speak with someone ASAP   Please call and advise

## 2020-12-31 ENCOUNTER — Telehealth: Payer: Self-pay | Admitting: *Deleted

## 2020-12-31 NOTE — Telephone Encounter (Signed)
Patient called front desk , asking to speak with nurse. She reports she tested + for trich and has discussed with nurse Jasmine December about results and treatment. States her husband states he went to urgent care and is negative . She states they are separated and she is out of the home. Wants to know if could be false positive, etc. I explained I don't think is false positive and is std you get from another person. We discussed she was negative in 08/2019. She states she has not been with anyone else.  I informed her I will have provider call her to better answer her questions and since Susanne Borders , CNM is not here I will ask another provider. She agreed with plan. I discussed with Edd Arbour , CNM and she will call her. Justis Dupas,RN

## 2021-02-05 ENCOUNTER — Ambulatory Visit: Payer: Managed Care, Other (non HMO) | Admitting: Student

## 2021-02-24 ENCOUNTER — Encounter: Payer: Self-pay | Admitting: Certified Nurse Midwife

## 2021-02-24 ENCOUNTER — Ambulatory Visit (INDEPENDENT_AMBULATORY_CARE_PROVIDER_SITE_OTHER): Payer: Managed Care, Other (non HMO) | Admitting: Certified Nurse Midwife

## 2021-02-24 ENCOUNTER — Other Ambulatory Visit: Payer: Self-pay

## 2021-02-24 ENCOUNTER — Other Ambulatory Visit (HOSPITAL_COMMUNITY)
Admission: RE | Admit: 2021-02-24 | Discharge: 2021-02-24 | Disposition: A | Discharge status: Home / Self Care | Payer: Managed Care, Other (non HMO) | Source: Ambulatory Visit | Attending: Student | Admitting: Student

## 2021-02-24 VITALS — BP 110/69 | HR 110 | Wt 171.6 lb

## 2021-02-24 DIAGNOSIS — N898 Other specified noninflammatory disorders of vagina: Secondary | ICD-10-CM | POA: Insufficient documentation

## 2021-02-24 DIAGNOSIS — A5901 Trichomonal vulvovaginitis: Secondary | ICD-10-CM | POA: Diagnosis not present

## 2021-02-25 LAB — CERVICOVAGINAL ANCILLARY ONLY
Bacterial Vaginitis (gardnerella): NEGATIVE
Candida Glabrata: NEGATIVE
Candida Vaginitis: NEGATIVE
Chlamydia: NEGATIVE
Comment: NEGATIVE
Comment: NEGATIVE
Comment: NEGATIVE
Comment: NEGATIVE
Comment: NEGATIVE
Comment: NORMAL
Neisseria Gonorrhea: NEGATIVE
Trichomonas: NEGATIVE

## 2021-02-26 NOTE — Progress Notes (Signed)
History:  Ms. Olivia Hayes is a 25 y.o. [phone] who presents to clinic today for retest for trichomonas. Was diagnosed and treated at last visit, but the diagnosis has caused major relationship upset as she and her partner both claim fidelity. Just prior to her diagnosis she bought a swimsuit online and wore it without washing it, wonders if that could be the culprit. Her partner was subsequently tested, it was negative and he never sought treatment. They have had IC since, now she wants to be retested to make sure he did not give it back to her. No other physical complaints.  The following portions of the patient's history were reviewed and updated as appropriate: allergies, current medications, family history, past medical history, social history, past surgical history and problem list.  Review of Systems:  Pertinent items noted in HPI and remainder of comprehensive ROS otherwise negative.  Objective:  Physical Exam BP 110/69   Pulse (!) 110   Wt 171 lb 9.6 oz (77.8 kg)   BMI 29.46 kg/m  Physical Exam Vitals and nursing note reviewed.  Constitutional:      Appearance: Normal appearance.  HENT:     Head: Normocephalic.  Eyes:     Pupils: Pupils are equal, round, and reactive to light.  Cardiovascular:     Rate and Rhythm: Normal rate.  Pulmonary:     Effort: Pulmonary effort is normal.  Musculoskeletal:        General: Normal range of motion.  Neurological:     Mental Status: She is alert and oriented to person, place, and time.  Psychiatric:        Behavior: Behavior normal.        Thought Content: Thought content normal.    Labs and Imaging Self-swab collected  Assessment & Plan:  1. Vaginal discharge - Extensive discussion on how trichomonas spreads, it is virtually always sexually transmitted but few cases have been recorded from wet bathing suits. Pt verbalized understanding and stated if this test comes back negative, it will show that she got it from the bathing  suit and not her partner. - Cervicovaginal ancillary only( [facility])  Follow up PRN.  Bernerd Limbo, [place] 02/26/2021 12:33 PM

## 2021-03-17 ENCOUNTER — Encounter: Payer: Self-pay | Admitting: Family Medicine

## 2021-03-17 ENCOUNTER — Other Ambulatory Visit: Payer: Self-pay

## 2021-03-17 ENCOUNTER — Ambulatory Visit: Payer: Medicaid Other | Admitting: Family Medicine

## 2021-03-17 VITALS — BP 108/68 | HR 130 | Temp 97.8°F | Ht 64.0 in | Wt 173.0 lb

## 2021-03-17 DIAGNOSIS — T50905A Adverse effect of unspecified drugs, medicaments and biological substances, initial encounter: Secondary | ICD-10-CM | POA: Insufficient documentation

## 2021-03-17 DIAGNOSIS — Z8679 Personal history of other diseases of the circulatory system: Secondary | ICD-10-CM | POA: Diagnosis not present

## 2021-03-17 DIAGNOSIS — K120 Recurrent oral aphthae: Secondary | ICD-10-CM | POA: Diagnosis not present

## 2021-03-17 DIAGNOSIS — Z87898 Personal history of other specified conditions: Secondary | ICD-10-CM

## 2021-03-17 LAB — CBC WITH DIFFERENTIAL/PLATELET
Basophils Absolute: 0 10*3/uL (ref 0.0–0.1)
Basophils Relative: 0.5 % (ref 0.0–3.0)
Eosinophils Absolute: 0.2 10*3/uL (ref 0.0–0.7)
Eosinophils Relative: 2.4 % (ref 0.0–5.0)
HCT: 44.5 % (ref 36.0–46.0)
Hemoglobin: 14.6 g/dL (ref 12.0–15.0)
Lymphocytes Relative: 25.6 % (ref 12.0–46.0)
Lymphs Abs: 1.8 10*3/uL (ref 0.7–4.0)
MCHC: 32.8 g/dL (ref 30.0–36.0)
MCV: 86.6 fl (ref 78.0–100.0)
Monocytes Absolute: 0.4 10*3/uL (ref 0.1–1.0)
Monocytes Relative: 6.3 % (ref 3.0–12.0)
Neutro Abs: 4.6 10*3/uL (ref 1.4–7.7)
Neutrophils Relative %: 65.2 % (ref 43.0–77.0)
Platelets: 217 10*3/uL (ref 150.0–400.0)
RBC: 5.14 Mil/uL — ABNORMAL HIGH (ref 3.87–5.11)
RDW: 14.5 % (ref 11.5–15.5)
WBC: 7 10*3/uL (ref 4.0–10.5)

## 2021-03-17 NOTE — Progress Notes (Signed)
Established Patient Office Visit  Subjective:  Patient ID: Olivia Hayes, female    DOB: [birth year]  Age: 25 y.o. MRN: [id number]  CC:  Chief Complaint  Patient presents with   Bleeding/Bruising    HPI Olivia Hayes presents for evaluation out of concern of spontaneous bruising on her thighs.  Bruises are mostly resolved.  She has stopped search the Internet and is concerned about the possibility of a blood dyscrasia.  History of DVT associated with OCPs.  She does not smoke.  There has been no weight loss.  There have been no night sweats.  She feels well.  Resolving canker sore in right sublingual area.  She has a copper IUD in and wonders if the bruising could be caused by that.  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    Pelvic floor dysfunction    Yeast infection     Past Surgical History:  Procedure Laterality Date   ABDOMINAL HERNIA REPAIR  age 2   INDUCED ABORTION      Family History  Problem Relation Age of Onset   Thyroid disease Mother    Colon cancer Neg Hx    Pancreatic cancer Neg Hx    Esophageal cancer Neg Hx    Stomach cancer Neg Hx    Rectal cancer Neg Hx    Liver cancer Neg Hx     Social History   Socioeconomic History   Marital status: Married    Spouse name: Donnie   Number of children: 1   Years of education: Not on file   Highest education level: Not on file  Occupational History   Not on file  Tobacco Use   Smoking status: Never   Smokeless tobacco: Never  Vaping Use   Vaping Use: Never used  Substance and Sexual Activity   Alcohol use: Yes    Comment: Social   Drug use: Not Currently    Types: Marijuana    Comment: last smoked beginning February 2021   Sexual activity: Yes    Birth control/protection: None    Comment: intercourse  age 81, less than 5 sexua partners  Other Topics Concern   Not on file  Social History Narrative   Not on file   Social Determinants of Health   Financial Resource Strain: Not on file  Food Insecurity: Food Insecurity Present   Worried About Running Out of Food in the Last Year: Sometimes true   Ran Out of Food in the Last Year: Never true  Transportation Needs: No Transportation Needs   Lack of Transportation (Medical): No   Lack of Transportation (Non-Medical): No  Physical Activity: Not on file  Stress: Not on file  Social Connections: Not on file  Intimate Partner Violence: Not on file    Outpatient Medications Prior to Visit  Medication Sig Dispense Refill   paragard intrauterine copper IUD IUD 1 each by Intrauterine route once.     cetirizine (ZYRTEC) 5 MG tablet Take 1 tablet (5 mg total) by mouth daily. (Patient not taking: Reported on 03/17/2021) 90 tablet 1   Multiple Vitamin (MULTIVITAMIN) tablet Take 1 tablet by mouth daily. (Patient not taking: Reported on 03/17/2021)     No facility-administered medications prior to visit.  Allergies  Allergen Reactions   Peanuts [Peanut Oil] Anaphylaxis and Hives    ROS Review of Systems  Constitutional: Negative.  Negative for fatigue, fever and unexpected weight change.  Respiratory:  Negative for wheezing.   Cardiovascular: Negative.   Gastrointestinal: Negative.   Psychiatric/Behavioral: Negative.       Objective:    Physical Exam Nursing note reviewed.  Constitutional:      Appearance: Normal appearance.  HENT:     Head: Normocephalic and atraumatic.     Right Ear: Tympanic membrane, ear canal and external ear normal.     Left Ear: Tympanic membrane, ear canal and external ear normal.     Mouth/Throat:     Mouth: Mucous membranes are moist.     Pharynx: Oropharynx is clear. No oropharyngeal exudate or posterior oropharyngeal erythema.   Eyes:     Extraocular Movements: Extraocular movements intact.      Conjunctiva/sclera: Conjunctivae normal.     Pupils: Pupils are equal, round, and reactive to light.  Neck:     Vascular: No carotid bruit.  Cardiovascular:     Rate and Rhythm: Normal rate and regular rhythm.  Pulmonary:     Effort: Pulmonary effort is normal.     Breath sounds: Normal breath sounds.  Musculoskeletal:     Cervical back: No rigidity or tenderness.  Lymphadenopathy:     Cervical: No cervical adenopathy.  Skin:    General: Skin is warm and dry.  Neurological:     Mental Status: She is alert and oriented to person, place, and time.  Psychiatric:        Mood and Affect: Mood normal.        Behavior: Behavior normal.    BP 108/68   Pulse (!) 130   Temp 97.8 F (36.6 C) (Skin)   Ht 5\' 4"  (1.626 m)   Wt 173 lb (78.5 kg)   SpO2 97%   BMI 29.70 kg/m  Wt Readings from Last 3 Encounters:  03/17/21 173 lb (78.5 kg)  02/24/21 171 lb 9.6 oz (77.8 kg)  12/25/20 170 lb (77.1 kg)     Health Maintenance Due  Topic Date Due   COVID-19 Vaccine (1) Never done    There are no preventive care reminders to display for this patient.  Lab Results  Component Value Date   TSH 1.554 07/11/2015   Lab Results  Component Value Date   WBC 7.6 06/24/2020   HGB 14.8 06/24/2020   HCT 44.2 06/24/2020   MCV 86.6 06/24/2020   PLT 230.0 06/24/2020   Lab Results  Component Value Date   NA 136 09/26/2019   K 3.8 09/26/2019   CO2 21 (L) 09/26/2019   GLUCOSE 86 09/26/2019   BUN 8 09/26/2019   CREATININE 0.58 09/26/2019   BILITOT 0.3 09/26/2019   ALKPHOS 30 (L) 09/26/2019   AST 16 09/26/2019   ALT 24 09/26/2019   PROT 6.9 09/26/2019   ALBUMIN 3.5 09/26/2019   CALCIUM 8.7 (L) 09/26/2019   ANIONGAP 10 09/26/2019   No results found for: CHOL No results found for: HDL No results found for: LDLCALC No results found for: TRIG No results found for: CHOLHDL Lab Results  Component Value Date   HGBA1C 5.0 10/10/2019      Assessment & Plan:   Problem List Items  Addressed This Visit       Digestive   Aphthous ulcer of mouth     Other   Drug reaction   History of  ecchymosis - Primary   Relevant Orders   CBC w/Diff   Lactate dehydrogenase    No orders of the defined types were placed in this encounter.   Follow-up: Return if symptoms worsen or fail to improve, for Also follow up with Gyn. .  Database review shows that her copper IUD is an unlikely cause.  She will follow-up with GYN.  Aphthous ulcer appears to be resolving.  Follow-up if it does not continue this course.  CBC with differential is pending.  She is concerned about an allergic reaction to the IUD.  Suggested that she take both pending to her wrist overnight and see if there is a reaction.  Mliss Sax, MD

## 2021-03-18 ENCOUNTER — Encounter: Payer: Self-pay | Admitting: Family Medicine

## 2021-03-18 LAB — LACTATE DEHYDROGENASE: LDH: 110 U/L (ref 100–200)

## 2021-03-19 NOTE — Telephone Encounter (Signed)
Curstin just called and is concerned she hasn't gotten a response about her labs.

## 2021-04-29 ENCOUNTER — Other Ambulatory Visit: Payer: Self-pay

## 2021-04-29 ENCOUNTER — Encounter: Payer: Self-pay | Admitting: Family Medicine

## 2021-04-29 ENCOUNTER — Ambulatory Visit (INDEPENDENT_AMBULATORY_CARE_PROVIDER_SITE_OTHER): Payer: Medicaid Other | Admitting: Obstetrics & Gynecology

## 2021-04-29 VITALS — BP 112/65 | HR 70 | Ht 64.0 in | Wt 171.8 lb

## 2021-04-29 DIAGNOSIS — Z975 Presence of (intrauterine) contraceptive device: Secondary | ICD-10-CM

## 2021-04-29 DIAGNOSIS — Z87898 Personal history of other specified conditions: Secondary | ICD-10-CM | POA: Diagnosis not present

## 2021-04-29 NOTE — Progress Notes (Signed)
Patient ID: Olivia Hayes, female   DOB: 03-16-1996, 25 y.o.   MRN: [id number]  Chief Complaint  Patient presents with   Follow-up    HPI Olivia Hayes is a 25 y.o. female.  [id number] Patient's last menstrual period was 04/26/2021. She has Paragard in place since 11/2020 and she comes to discuss a possible complication. Last month she reported bruising on both thighs and was seen by her PCP who suggested this could have been caused by the IUD. She is doing well with the IUD without pain or heavy menses. 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    Pelvic floor dysfunction    Yeast infection     Past Surgical History:  Procedure Laterality Date   ABDOMINAL HERNIA REPAIR  age 44   INDUCED ABORTION      Family History  Problem Relation Age of Onset   Thyroid disease Mother    Colon cancer Neg Hx    Pancreatic cancer Neg Hx    Esophageal cancer Neg Hx    Stomach cancer Neg Hx    Rectal cancer Neg Hx    Liver cancer Neg Hx     Social History Social History   Tobacco Use   Smoking status: Never   Smokeless tobacco: Never  Vaping Use   Vaping Use: Never used  Substance Use Topics   Alcohol use: Yes    Comment: Social   Drug use: Not Currently    Types: Marijuana    Comment: last smoked beginning February 2021    Allergies  Allergen Reactions   Peanuts [Peanut Oil] Anaphylaxis and Hives    Current Outpatient Medications  Medication Sig Dispense Refill   paragard intrauterine copper IUD IUD 1 each by Intrauterine route once.     cetirizine (ZYRTEC) 5 MG tablet Take 1 tablet (5 mg total) by mouth daily. (Patient not taking: No sig reported) 90 tablet 1   Multiple Vitamin (MULTIVITAMIN) tablet Take 1 tablet by mouth daily. (Patient not taking:  No sig reported)     No current facility-administered medications for this visit.    Review of Systems Review of Systems  Constitutional: Negative.   Gastrointestinal: Negative.   Genitourinary:  Negative for menstrual problem, pelvic pain, vaginal bleeding and vaginal discharge.  Skin:        Bruises have resolved   Blood pressure 112/65, pulse 70, height 5\' 4"  (1.626 m), weight 171 lb 12.8 oz (77.9 kg), last menstrual period 04/26/2021, not currently breastfeeding.  Physical Exam Physical Exam Constitutional:      Appearance: Normal appearance.  Pulmonary:     Effort: Pulmonary effort is normal.  Neurological:     Mental Status: She is alert.  Psychiatric:        Mood and Affect: Mood normal.        Behavior: Behavior normal.    Data Reviewed Office notes  Assessment Recent bruising unknown cause but no link to IUD can be identified  Plan Reassured that she is doing well with the IUD and that it is safe to continue.    04/28/2021 04/29/2021, 10:54 AM

## 2021-05-04 ENCOUNTER — Other Ambulatory Visit: Payer: Self-pay

## 2021-05-04 ENCOUNTER — Encounter: Payer: Self-pay | Admitting: Family Medicine

## 2021-05-04 ENCOUNTER — Ambulatory Visit: Payer: Medicaid Other | Admitting: Family Medicine

## 2021-05-04 VITALS — BP 90/60 | HR 116 | Temp 97.0°F | Ht 64.0 in | Wt 170.1 lb

## 2021-05-04 DIAGNOSIS — R52 Pain, unspecified: Secondary | ICD-10-CM | POA: Diagnosis not present

## 2021-05-04 DIAGNOSIS — Z20822 Contact with and (suspected) exposure to covid-19: Secondary | ICD-10-CM | POA: Diagnosis not present

## 2021-05-04 DIAGNOSIS — J029 Acute pharyngitis, unspecified: Secondary | ICD-10-CM

## 2021-05-04 LAB — POC INFLUENZA A&B (BINAX/QUICKVUE)
Influenza A, POC: NEGATIVE
Influenza B, POC: NEGATIVE

## 2021-05-04 LAB — POCT RAPID STREP A (OFFICE): Rapid Strep A Screen: NEGATIVE

## 2021-05-04 MED ORDER — LIDOCAINE VISCOUS HCL 2 % MT SOLN: OROMUCOSAL | 1 refills | Status: DC

## 2021-05-04 NOTE — Progress Notes (Signed)
Olivia Hefel T. [name], MD, CAQ Sports Medicine [facility] at [facility] [address] [place], [address]  Phone: [phone]  FAX: [phone]  Olivia Hayes - 25 y.o. female  MRN [id number]  Date of Birth: 07/04/1996  Date: 05/04/2021  PCP: Loyola Mast, MD  Referral: Loyola Mast, MD  Chief Complaint  Patient presents with   Sore Throat    Daughter Dx with RSV last Monday Patient has negative Covid test at [facility] yesterday   Headache   Generalized Body Aches   Cough    Slight & Dry   Chills    This visit occurred during the SARS-CoV-2 public health emergency.  Safety protocols were in place, including screening questions prior to the visit, additional usage of staff PPE, and extensive cleaning of exam room while observing appropriate contact time as indicated for disinfecting solutions.   Subjective:   Olivia Hayes is a 25 y.o. very pleasant female patient with Body mass index is 29.2 kg/m. who presents with the following:  Multiple symptoms are as above.  Sat. Onset of symptoms.  Daughter has RSV.  Achy pain and cannot really walk all that well.  Her generalized body aches are the worst symptom for her.  She does still have a lot of sinus pressure, congestion, and pain.  She has have a mild cough. Does have some chills and feeling hot, but she does not have a fever.  She has also been having some nausea, vomiting, and diarrhea.  She also reports that she has had some decreased smell today.   Review of Systems is noted in the HPI, as appropriate  Objective:   BP 90/60   Pulse (!) 116   Temp (!) 97 F (36.1 C) (Temporal)   Ht 5\' 4"  (1.626 m)   Wt 170 lb 2 oz (77.2 kg)   LMP 04/26/2021 Comment: Patient still gets periods on IUD.  SpO2 98%   BMI 29.20 kg/m    Gen: WDWN, cooperative. Globally Non-toxic HEENT: Normocephalic and atraumatic. Throat clear, w/o exudate, R TM clear, L TM - good landmarks, No fluid present.  rhinnorhea. No frontal or maxillary sinus T. MMM NECK: Anterior cervical  LAD is present CV: RRR, No M/G/R, cap refill <2 sec PULM: Breathing comfortably in no respiratory distress. no wheezing, crackles, rhonchi MSK: Nml gait   Laboratory and Imaging Data: Results for orders placed or performed in visit on 05/04/21  POC Influenza A&B (Binax test)  Result Value Ref Range   Influenza A, POC Negative Negative   Influenza B, POC Negative Negative  POCT rapid strep A  Result Value Ref Range   Rapid Strep A Screen Negative Negative     Assessment and Plan:     ICD-10-CM   1. Suspected COVID-19 virus infection  Z20.822     2. Sore throat  J02.9 POCT rapid strep A    3. Body aches  R52 POC Influenza A&B (Binax test)     Clinical history is suggestive of COVID-19.  Flu test and strep test are negative here in the office today.  The risk of false negative is quite high when you check within the first 48 hours of onset of symptoms, and I worry that this is the case here.  We reviewed supportive care, and I am also sending her in some Magic mouthwash.  Recommended that she get additional at home COVID testing on Tuesday and Wednesday.  Work note is written.  Meds ordered  this encounter  Medications   magic mouthwash (lidocaine, diphenhydrAMINE, alum & mag hydroxide) suspension    Sig: Mix: 80 ml maalox, 80 ml benadryl susp, 80 mL lidocaine 1% 1 tsp po gargle q 4 hours prn sore throat #240 mL total    Dispense:  240 mL    Refill:  1   There are no discontinued medications. Orders Placed This Encounter  Procedures   POC Influenza A&B (Binax test)   POCT rapid strep A    Follow-up: No follow-ups on file.  Dragon [employer] One speech-to-text software was used for transcription in this dictation.  Possible transcriptional errors can occur using Animal nutritionist.   Signed,  Elpidio Galea. Lashaun Poch, MD   Outpatient Encounter Medications as of 05/04/2021  Medication Sig   cetirizine  (ZYRTEC) 5 MG tablet Take 1 tablet (5 mg total) by mouth daily.   magic mouthwash (lidocaine, diphenhydrAMINE, alum & mag hydroxide) suspension Mix: 80 ml maalox, 80 ml benadryl susp, 80 mL lidocaine 1% 1 tsp po gargle q 4 hours prn sore throat #240 mL total   Multiple Vitamin (MULTIVITAMIN) tablet Take 1 tablet by mouth daily.   paragard intrauterine copper IUD IUD 1 each by Intrauterine route once.   No facility-administered encounter medications on file as of 05/04/2021.

## 2021-05-04 NOTE — Patient Instructions (Signed)
Take Tylenol/Acetaminophen ES (500mg ) 2 tabs by mouth three times a day max as needed.   You should be able to take generic ibuprofen orally. (Over the counter Motrin, Advil, or Generic Ibuprofen 200 mg tablets. 3-4 tablets by mouth 3 times a day. This equals a prescription strength dose.)

## 2021-05-05 ENCOUNTER — Other Ambulatory Visit: Payer: Self-pay

## 2021-05-05 ENCOUNTER — Emergency Department (HOSPITAL_COMMUNITY)
Admission: EM | Admit: 2021-05-05 | Discharge: 2021-05-06 | Disposition: A | Discharge status: Home / Self Care | Payer: Medicaid Other | Attending: Emergency Medicine | Admitting: Emergency Medicine

## 2021-05-05 ENCOUNTER — Telehealth: Payer: Self-pay | Admitting: Family Medicine

## 2021-05-05 DIAGNOSIS — J45909 Unspecified asthma, uncomplicated: Secondary | ICD-10-CM | POA: Insufficient documentation

## 2021-05-05 DIAGNOSIS — E860 Dehydration: Secondary | ICD-10-CM | POA: Diagnosis not present

## 2021-05-05 DIAGNOSIS — B349 Viral infection, unspecified: Secondary | ICD-10-CM | POA: Diagnosis not present

## 2021-05-05 DIAGNOSIS — Z20822 Contact with and (suspected) exposure to covid-19: Secondary | ICD-10-CM | POA: Insufficient documentation

## 2021-05-05 DIAGNOSIS — R509 Fever, unspecified: Secondary | ICD-10-CM | POA: Diagnosis present

## 2021-05-05 DIAGNOSIS — R112 Nausea with vomiting, unspecified: Secondary | ICD-10-CM | POA: Diagnosis not present

## 2021-05-05 LAB — SALICYLATE LEVEL: Salicylate Lvl: <7 mg/dL — ABNORMAL LOW (ref 7.0–30.0)

## 2021-05-05 LAB — BASIC METABOLIC PANEL
Anion gap: 11 (ref 5–15)
BUN: 5 mg/dL — ABNORMAL LOW (ref 6–20)
CO2: 20 mmol/L — ABNORMAL LOW (ref 22–32)
Calcium: 8.4 mg/dL — ABNORMAL LOW (ref 8.9–10.3)
Chloride: 102 mmol/L (ref 98–111)
Creatinine, Ser: 0.77 mg/dL (ref 0.44–1.00)
GFR, Estimated: >60 mL/min (ref >60)
Glucose, Bld: 97 mg/dL (ref 70–99)
Potassium: 3.4 mmol/L — ABNORMAL LOW (ref 3.5–5.1)
Sodium: 133 mmol/L — ABNORMAL LOW (ref 135–145)

## 2021-05-05 LAB — CBC WITH DIFFERENTIAL/PLATELET
Abs Immature Granulocytes: 0.12 10*3/uL — ABNORMAL HIGH (ref 0.00–0.07)
Basophils Absolute: 0 10*3/uL (ref 0.0–0.1)
Basophils Relative: 0 %
Eosinophils Absolute: 0 10*3/uL (ref 0.0–0.5)
Eosinophils Relative: 0 %
HCT: 41.5 % (ref 36.0–46.0)
Hemoglobin: 13.8 g/dL (ref 12.0–15.0)
Immature Granulocytes: 1 %
Lymphocytes Relative: 10 %
Lymphs Abs: 1.3 10*3/uL (ref 0.7–4.0)
MCH: 28.7 pg (ref 26.0–34.0)
MCHC: 33.3 g/dL (ref 30.0–36.0)
MCV: 86.3 fL (ref 80.0–100.0)
Monocytes Absolute: 1 10*3/uL (ref 0.1–1.0)
Monocytes Relative: 8 %
Neutro Abs: 9.7 10*3/uL — ABNORMAL HIGH (ref 1.7–7.7)
Neutrophils Relative %: 81 %
Platelets: 219 10*3/uL (ref 150–400)
RBC: 4.81 MIL/uL (ref 3.87–5.11)
RDW: 13.2 % (ref 11.5–15.5)
WBC: 12.1 10*3/uL — ABNORMAL HIGH (ref 4.0–10.5)
nRBC: 0 % (ref 0.0–0.2)

## 2021-05-05 LAB — LACTIC ACID, PLASMA: Lactic Acid, Venous: 0.8 mmol/L (ref 0.5–1.9)

## 2021-05-05 LAB — RESP PANEL BY RT-PCR (FLU A&B, COVID) ARPGX2
Influenza A by PCR: NEGATIVE
Influenza B by PCR: NEGATIVE
SARS Coronavirus 2 by RT PCR: NEGATIVE

## 2021-05-05 LAB — GROUP A STREP BY PCR: Group A Strep by PCR: NOT DETECTED

## 2021-05-05 LAB — MONONUCLEOSIS SCREEN: Mono Screen: NEGATIVE

## 2021-05-05 MED ORDER — ACETAMINOPHEN 325 MG PO TABS
650.0000 mg | ORAL_TABLET | Freq: Once | ORAL | Status: AC
  Administered 2021-05-05: 650 mg via ORAL
  Filled 2021-05-05: qty 2

## 2021-05-05 NOTE — ED Provider Notes (Signed)
Emergency Medicine Provider Triage Evaluation Note  Olivia Hayes , a 25 y.o. female  was evaluated in triage.  Pt complains of general malaise, fever, sore throat, cough.  Was seen at [facility] had a negative COVID test.  Was also seen and evaluated her primary care doctor for similar symptoms and had a negative strep test.  Started spiking fevers today.  She also states she took more than the prescribed dose of aspirin last night along with NyQuil.  Now complaining of right-sided headache.    Review of Systems  Positive:  Negative: See above   Physical Exam  BP 127/79 (BP Location: Right Arm)   Pulse (!) 119   Temp (!) 103.2 F (39.6 C)   Resp 18   LMP 04/26/2021 Comment: Patient still gets periods on IUD.  SpO2 100%  Gen:   Awake, no distress   Resp:  Normal effort  MSK:   Moves extremities without difficulty  Other:    Medical Decision Making  Medically screening exam initiated at 3:46 PM.  Appropriate orders placed.  Annaclaire Walsworth was informed that the remainder of the evaluation will be completed by another provider, this initial triage assessment does not replace that evaluation, and the importance of remaining in the ED until their evaluation is complete.     Honor Loh [place], PA-C 05/05/21 1547    Pollyann Savoy, MD 05/05/21 1640

## 2021-05-05 NOTE — Telephone Encounter (Signed)
Spoke to patient, she states that she is having bad HA's and has been taking Excedrin every 3-4 hours as well as taking Day Quil and NyQuil.  She is also having body aches and not feeling well at all.  Advised to go to the Emergency Room to be checked out.  She is agreeable and is waiting on her dad to come and take her.    (She is having flu/covid symptoms).   Dm/cma

## 2021-05-05 NOTE — Telephone Encounter (Signed)
Pt was seen by on 05/04/21 by Dr. Patsy Lager from [facility], but she is a Dr. Veto Kemps pt. She was seen in the office for flu like symptoms. She stated starting last night at midnight- she felt like someone had hit her over the head really hard and she has a throbbing pain shooting down the Left side of her body. I tried to transfer her over to Nurse Triage, but she had hung up the phone.

## 2021-05-05 NOTE — ED Triage Notes (Signed)
Pt from home for eval of body aches, sore throat, n/v/d, and headache. Sts her doctor told her she had fluid in her ears and she also had negative covid and flu tests yesterday. Taking excedrin, dayquil, and nyquil and is worried she took too much excedrin. Daughter has RSV.

## 2021-05-06 LAB — RAPID HIV SCREEN (HIV 1/2 AB+AG)
HIV 1/2 Antibodies: NONREACTIVE
HIV-1 P24 Antigen - HIV24: NONREACTIVE

## 2021-05-06 MED ORDER — IBUPROFEN 400 MG PO TABS
600.0000 mg | ORAL_TABLET | Freq: Once | ORAL | Status: AC
  Administered 2021-05-06: 600 mg via ORAL
  Filled 2021-05-06: qty 1

## 2021-05-06 NOTE — Discharge Instructions (Addendum)
You were evaluated in the Emergency Department and after careful evaluation, we did not find any emergent condition requiring admission or further testing in the hospital.  Your exam/testing today is overall reassuring.  Symptoms likely due to a viral illness, possibly RSV given your exposure.  Recommend Tylenol, Motrin for discomfort, more rest, plenty of fluids.  Please return to the Emergency Department if you experience any worsening of your condition.   Thank you for allowing [place] to be a part of your care.

## 2021-05-06 NOTE — ED Provider Notes (Signed)
[facility]EMER[facility]ENCY DEPT [facility] Emergency Department Provider Note MRN:  [id number]  Arrival date & time: 05/06/21     Chief Complaint   Fever History of Present Illness   Olivia Hayes is a 25 y.o. year-old female with no pertinent past medical history presenting to the ED with chief complaint of fever.  4 days of fever, body aches, sore throat, intermittent headache, nausea with occasional vomiting.  Daughter tested positive for RSV last week.  Denies chest pain or shortness of breath, no abdominal pain, no numbness or weakness to the arms or legs, no leg pain or swelling.  Symptoms constant, moderate, no exacerbating or alleviating factors.  Review of Systems  A complete 10 system review of systems was obtained and all systems are negative except as noted in the HPI and PMH.   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    Pelvic floor dysfunction    Yeast infection     Past Surgical History:  Procedure Laterality Date   ABDOMINAL HERNIA REPAIR  age 71   INDUCED ABORTION      Family History  Problem Relation Age of Onset   Thyroid disease Mother    Colon cancer Neg Hx    Pancreatic cancer Neg Hx    Esophageal cancer Neg Hx    Stomach cancer Neg Hx    Rectal cancer Neg Hx    Liver cancer Neg Hx     Social History   Socioeconomic History   Marital status: Married    Spouse name: Donnie   Number of children: 1   Years of education: Not on file   Highest education level: Not on file  Occupational History   Not on file  Tobacco Use   Smoking status: Never   Smokeless tobacco: Never  Vaping Use   Vaping Use: Never used  Substance and Sexual Activity   Alcohol use: Yes    Comment: Social   Drug use: Not Currently    Types: Marijuana     Comment: last smoked beginning February 2021   Sexual activity: Yes    Birth control/protection: None    Comment: intercourse age 57, less than 5 sexua partners  Other Topics Concern   Not on file  Social History Narrative   Not on file   Social Determinants of Health   Financial Resource Strain: Not on file  Food Insecurity: No Food Insecurity   Worried About Programme researcher, broadcasting/film/video in the Last Year: Never true   Ran Out of Food in the Last Year: Never true  Transportation Needs: No Transportation Needs   Lack of Transportation (Medical): No   Lack of Transportation (Non-Medical): No  Physical Activity: Not on file  Stress: Not on file  Social Connections: Not on file  Intimate Partner Violence: Not on file     Physical Exam   Vitals:   05/05/21 1832 05/05/21 2250  BP: 114/73 116/83  Pulse: 92 (!) 112  Resp: 15 16  Temp: 98.8 F (37.1 C)   SpO2: 100% 99%    CONSTITUTIONAL: Well-appearing, NAD NEURO:  Alert and oriented x 3, no focal deficits EYES:  eyes equal and reactive ENT/NECK:  no LAD, no  JVD CARDIO: Regular rate, well-perfused, normal S1 and S2 PULM:  CTAB no wheezing or rhonchi GI/GU:  normal bowel sounds, non-distended, non-tender MSK/SPINE:  No gross deformities, no edema SKIN:  no rash, atraumatic PSYCH:  Appropriate speech and behavior  *Additional and/or pertinent findings included in MDM below  Diagnostic and Interventional Summary    EKG Interpretation  Date/Time:    Ventricular Rate:    PR Interval:    QRS Duration:   QT Interval:    QTC Calculation:   R Axis:     Text Interpretation:         Labs Reviewed  CBC WITH DIFFERENTIAL/PLATELET - Abnormal; Notable for the following components:      Result Value   WBC 12.1 (*)    Neutro Abs 9.7 (*)    Abs Immature Granulocytes 0.12 (*)    All other components within normal limits  BASIC METABOLIC PANEL - Abnormal; Notable for the following components:   Sodium 133 (*)    Potassium 3.4 (*)     CO2 20 (*)    BUN 5 (*)    Calcium 8.4 (*)    All other components within normal limits  SALICYLATE LEVEL - Abnormal; Notable for the following components:   Salicylate Lvl <7.0 (*)    All other components within normal limits  RESP PANEL BY RT-PCR (FLU A&B, COVID) ARPGX2  GROUP A STREP BY PCR  MONONUCLEOSIS SCREEN  LACTIC ACID, PLASMA  RAPID HIV SCREEN (HIV 1/2 AB+AG)    No orders to display    Medications  acetaminophen (TYLENOL) tablet 650 mg (650 mg Oral Given 05/05/21 1557)     Procedures  /  Critical Care Procedures  ED Course and Medical Decision Making  I have reviewed the triage vital signs, the nursing notes, and pertinent available records from the EMR.  Listed above are laboratory and imaging tests that I personally ordered, reviewed, and interpreted and then considered in my medical decision making (see below for details).  On my evaluation patient is with normal vital signs, tachycardia is resolved.  She has normal respiratory effort, no increased work of breathing, lungs are clear.  No meningismus, abdomen soft and nontender.  No rash.  Neurologically she has normal and symmetric strength and sensation, normal coordination, normal speech.  Symptoms suggest viral illness, known exposure to RSV.  A bit atypical to be having a full flulike presentation at her age, and initiation negative for flu, negative for COVID, negative for mono.  Patient has concerns about HIV but overall is low risk, has no rash.  Testing here and she can follow-up as an outpatient.  Otherwise no indication for further testing or admission, appropriate for discharge with return precautions.       Elmer Sow. Pilar Plate, MD [facility] Emergency Medicine [facility] [email or web address]  Final Clinical Impressions(s) / ED Diagnoses     ICD-10-CM   1. Viral illness  B34.9     2. Dehydration  E86.0       ED Discharge Orders     None        Discharge Instructions Discussed  with and Provided to Patient:    Discharge Instructions      You were evaluated in the Emergency Department and after careful evaluation, we did not find any emergent condition requiring admission or further testing in the hospital.  Your exam/testing today is overall reassuring.  Symptoms likely due to a viral illness, possibly RSV given your exposure.  Recommend Tylenol, Motrin for discomfort, more rest, plenty of fluids.  Please return to the Emergency Department if you experience any worsening of your condition.   Thank you for allowing [place] to be a part of your care.       Sabas Sous, MD 05/06/21 [phone]

## 2021-06-29 ENCOUNTER — Ambulatory Visit
Admission: EM | Admit: 2021-06-29 | Discharge: 2021-06-29 | Disposition: A | Discharge status: Home / Self Care | Payer: BC Managed Care – PPO | Attending: Emergency Medicine | Admitting: Emergency Medicine

## 2021-06-29 ENCOUNTER — Other Ambulatory Visit: Payer: Self-pay

## 2021-06-29 DIAGNOSIS — K1370 Unspecified lesions of oral mucosa: Secondary | ICD-10-CM

## 2021-06-29 MED ORDER — AMOXICILLIN-POT CLAVULANATE 875-125 MG PO TABS: 1.0000 | ORAL_TABLET | Freq: Two times a day (BID) | ORAL | 0 refills | Status: DC

## 2021-06-29 MED ORDER — NYSTATIN 100000 UNIT/ML MT SUSP: 15.0000 mL | Freq: Four times a day (QID) | OROMUCOSAL | 0 refills | Status: DC | PRN

## 2021-06-29 NOTE — Discharge Instructions (Addendum)
Please begin Augmentin now, 1 tablet twice daily for the next 14 days.  Please make a follow-up appointment with your dentist as soon as possible for evaluation of your left bottom rear molar which may be infected.  I have also provided you with a prescription for Magic mouthwash which should provide you with some pain relief while the antibiotic begins to work.

## 2021-06-29 NOTE — ED Triage Notes (Signed)
Pt c/o swelling and pain to left side of pain (dental).  Started: 2 days ago

## 2021-06-29 NOTE — ED Provider Notes (Signed)
[facility]    CSN: [id number] Arrival date & time: 06/29/21  1159    HISTORY  No chief complaint on file.  HPI Olivia Hayes is a 25 y.o. female. Patient complains of swelling and pain left side of her gums, patient has a history of aphthous ulcers for which she has been prescribed Magic mouthwash, last prescribed September 2022, states the pharmacy never filled it for her, states aphthous ulcer finally did resolve without intervention.  Patient states she is not sure if the pain and swelling she is having right now is similar.  Patient states she is had all 4 of her wisdom teeth removed, thinks that the pocket of the left lower wisdom tooth is now infected.  Patient states when she woke up this morning she had difficulty closing her jaw secondary to pain, patient states that her gum is also overriding her last tooth in the back.  Patient states the pain radiates from the left jaw across her face, states she feels her face is swollen but realizes that it does not look swollen on the outside.  The history is provided by the patient.  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    Pelvic floor dysfunction    Yeast infection    Patient Active Problem List   Diagnosis Date Noted   Aphthous ulcer of mouth 03/17/2021   Drug reaction 03/17/2021   History of ecchymosis 03/17/2021   Trichomoniasis 12/28/2020   Intrauterine device 12/22/2020   Seasonal allergic rhinitis 09/12/2020   Rectal bleeding 06/12/2020   History of DVT (deep vein thrombosis) 08/24/2019   Herpes simplex 10/15/2018   Inattention 01/06/2018   Globus sensation 07/11/2015   Acne 03/28/2013   Multiple allergies 02/21/2013   Past Surgical History:  Procedure Laterality Date   ABDOMINAL HERNIA REPAIR  age 2    INDUCED ABORTION     OB History     Gravida  3   Para  1   Term  1   Preterm  0   AB  2   Living  1      SAB  0   IAB  2   Ectopic  0   Multiple  0   Live Births  1          Home Medications    Prior to Admission medications   Medication Sig Start Date End Date Taking? Authorizing Provider  Multiple Vitamin (MULTIVITAMIN) tablet Take 1 tablet by mouth daily.    [provider]  paragard intrauterine copper IUD IUD 1 each by Intrauterine route once. 11/02/20   [provider]    Family History Family History  Problem Relation Age of Onset   Thyroid disease Mother    Colon cancer Neg Hx    Pancreatic cancer Neg Hx    Esophageal cancer Neg Hx    Stomach cancer Neg Hx    Rectal cancer Neg Hx    Liver cancer Neg Hx    Social History Social History   Tobacco Use   Smoking status: Never   Smokeless tobacco: Never  Vaping Use   Vaping Use: Never used  Substance Use Topics   Alcohol use: Yes    Comment: Social  Drug use: Not Currently    Types: Marijuana    Comment: last smoked beginning February 2021   Allergies   Peanuts [peanut oil]  Review of Systems Review of Systems Pertinent findings noted in history of present illness.   Physical Exam Triage Vital Signs ED Triage Vitals  Enc Vitals Group     BP 05/01/21 0827 (!) 147/82     Pulse Rate 05/01/21 0827 72     Resp 05/01/21 0827 18     Temp 05/01/21 0827 98.3 F (36.8 C)     Temp Source 05/01/21 0827 Oral     SpO2 05/01/21 0827 98 %     Weight --      Height --      Head Circumference --      Peak Flow --      Pain Score 05/01/21 0826 5     Pain Loc --      Pain Edu? --      Excl. in [place]? --   No data found.  Updated Vital Signs BP 110/73 (BP Location: Left Arm)    Pulse 75    Temp 98.7 F (37.1 C) (Oral)    Resp 18    LMP 06/24/2021 (Approximate)    SpO2 96%   Physical Exam Constitutional:      General: She is not in acute distress.    Appearance: Normal  appearance. She is normal weight. She is not ill-appearing, toxic-appearing or diaphoretic.  HENT:     Head: Normocephalic and atraumatic.     Jaw: Tenderness and pain on movement present. No trismus, swelling or malocclusion.     Salivary Glands: Right salivary gland is not diffusely enlarged or tender. Left salivary gland is not diffusely enlarged or tender.     Right Ear: Tympanic membrane and ear canal normal.     Left Ear: Tympanic membrane and ear canal normal.     Nose: Nose normal.     Mouth/Throat:     Mouth: Mucous membranes are moist.     Dentition: Dental tenderness, gingival swelling, dental caries and gum lesions present.     Tongue: No lesions. Tongue does not deviate from midline.     Pharynx: Oropharynx is clear. Uvula midline.     Tonsils: No tonsillar exudate or tonsillar abscesses. 0 on the right. 0 on the left.  Eyes:     Extraocular Movements: Extraocular movements intact.     Pupils: Pupils are equal, round, and reactive to light.  Cardiovascular:     Rate and Rhythm: Normal rate and regular rhythm.     Heart sounds: Normal heart sounds.  Pulmonary:     Effort: Pulmonary effort is normal.     Breath sounds: Normal breath sounds.  Musculoskeletal:     Cervical back: Full passive range of motion without pain, normal range of motion and neck supple.  Lymphadenopathy:     Cervical: No cervical adenopathy.  Skin:    General: Skin is warm and dry.  Neurological:     Mental Status: She is alert.  Psychiatric:        Mood and Affect: Mood normal.        Behavior: Behavior normal.    Visual Acuity Right Eye Distance:   Left Eye Distance:   Bilateral Distance:    Right Eye Near:   Left Eye Near:    Bilateral Near:     UC Couse / Diagnostics / Procedures:    EKG  Radiology No  results found.  Procedures Procedures (including critical care time)  UC Diagnoses / Final Clinical Impressions(s)   I have reviewed the triage vital signs and the nursing  notes.  Pertinent labs & imaging results that were available during my care of the patient were reviewed by me and considered in my medical decision making (see chart for details).    Final diagnoses:  Lesion of mouth   Based on location of redness, surrounding leukoplakia, diffuse tenderness in the area of patient's jaw, presence of dental carry on her left lower rear molar, absence of lymphadenopathy, significant swelling that is partially enveloping her left lower rear molar, is difficult for me to determine whether she is experiencing another aphthous ulcer, soft tissue abscess or has a dental abscess.  I will provide her with prescription for Magic mouthwash, patient was advised antibiotics would be warranted with if she is having soft tissue swelling or dental abscess and I have also advised her to follow-up with a dentist as soon as possible.  Patient offered an injection of Bicillin, patient states she is not sure whether or not she is allergic to penicillin.  Patient then asked me how she would know whether or not she is allergic to penicillin.  I advised patient that if she has ever taken penicillin and had an allergic reaction, then she would be allergic to it.  Patient states she has never taken penicillin.  Will provide patient with a prescription for Augmentin while she awaits appointment with dentist.  ED Prescriptions     Medication Sig Dispense Auth. Provider   magic mouthwash (nystatin, lidocaine, diphenhydrAMINE, alum & mag hydroxide) suspension Swish and swallow 15 mLs 4 (four) times daily as needed for mouth pain. 180 mL Lynden Oxford Scales, PA-C   amoxicillin-clavulanate (AUGMENTIN) 875-125 MG tablet Take 1 tablet by mouth every 12 (twelve) hours. 14 tablet Lynden Oxford Scales, PA-C      PDMP not reviewed this encounter.  Pending results:  Labs Reviewed - No data to display  Medications Ordered in UC: Medications - No data to display  Disposition Upon Discharge:   Condition: stable for discharge home Home: take medications as prescribed; routine discharge instructions as discussed; follow up as advised.  Patient presented with an acute illness with associated systemic symptoms and significant discomfort requiring urgent management. In my opinion, this is a condition that a prudent lay person (someone who possesses an average knowledge of health and medicine) may potentially expect to result in complications if not addressed urgently such as respiratory distress, impairment of bodily function or dysfunction of bodily organs.   Routine symptom specific, illness specific and/or disease specific instructions were discussed with the patient and/or caregiver at length.   As such, the patient has been evaluated and assessed, work-up was performed and treatment was provided in alignment with urgent care protocols and evidence based medicine.  Patient/parent/caregiver has been advised that the patient may require follow up for further testing and treatment if the symptoms continue in spite of treatment, as clinically indicated and appropriate.  If the patient was tested for COVID-19, Influenza and/or RSV, then the patient/parent/guardian was advised to isolate at home pending the results of his/her diagnostic coronavirus test and potentially longer if theyre positive. I have also advised pt that if his/her COVID-19 test returns positive, it's recommended to self-isolate for at least 10 days after symptoms first appeared AND until fever-free for 24 hours without fever reducer AND other symptoms have improved or resolved. Discussed self-isolation recommendations  as well as instructions for household member/close contacts as per the CDC and [place] DHHS, and also gave patient the COVID packet with this information.  Patient/parent/caregiver has been advised to return to the [facility] or PCP in 3-5 days if no better; to PCP or the Emergency Department if new signs and symptoms develop,  or if the current signs or symptoms continue to change or worsen for further workup, evaluation and treatment as clinically indicated and appropriate  The patient will follow up with their current PCP if and as advised. If the patient does not currently have a PCP we will assist them in obtaining one.   The patient may need specialty follow up if the symptoms continue, in spite of conservative treatment and management, for further workup, evaluation, consultation and treatment as clinically indicated and appropriate.   Patient/parent/caregiver verbalized understanding and agreement of plan as discussed.  All questions were addressed during visit.  Please see discharge instructions below for further details of plan.  Discharge Instructions:   Discharge Instructions      Please begin Augmentin now, 1 tablet twice daily for the next 14 days.  Please make a follow-up appointment with your dentist as soon as possible for evaluation of your left bottom rear molar which may be infected.  I have also provided you with a prescription for Magic mouthwash which should provide you with some pain relief while the antibiotic begins to work.      This office note has been dictated using Teaching laboratory technician.  Unfortunately, and despite my best efforts, this method of dictation can sometimes lead to occasional typographical or grammatical errors.  I apologize in advance if this occurs.     Theadora Rama Scales, PA-C 06/29/21 1259

## 2021-07-13 IMAGING — US US OB < 14 WEEKS - US OB TV
2 series · 15 of 28 positions shown · non-contrast
Comparison: None.

CLINICAL DATA: Abdominal pain. Quantitative beta HCG level is
14,181

EXAM:
OBSTETRIC <14 WK US AND TRANSVAGINAL OB US
TECHNIQUE: Both transabdominal and transvaginal ultrasound examinations were
performed for complete evaluation of the gestation as well as the
maternal uterus, adnexal regions, and pelvic cul-de-sac.
Transvaginal technique was performed to assess early pregnancy.

[Series 1: us ob < 14 weeks - us ob tv · 14 of 74 slices shown (1 of 2)]
[im 1/74]
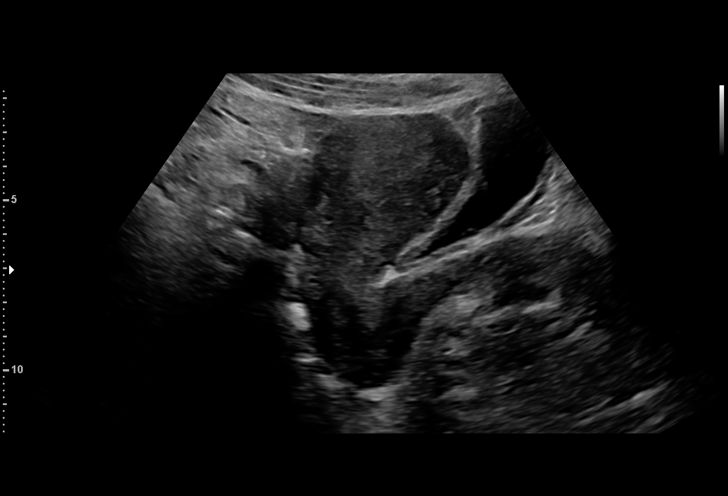
[im 6/74]
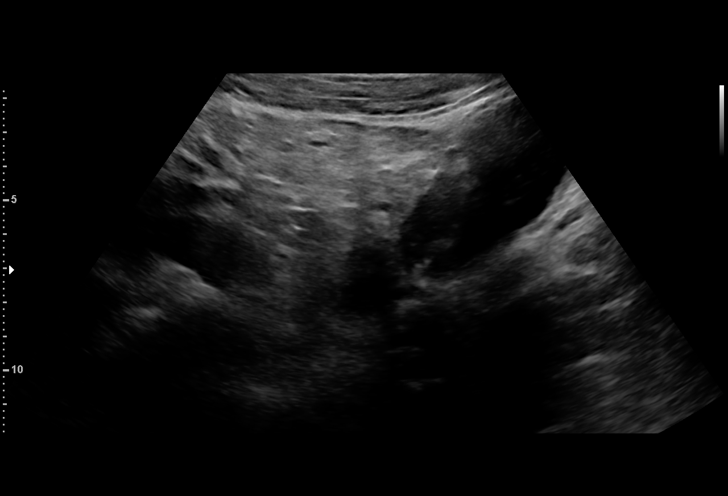
[im 12/74]
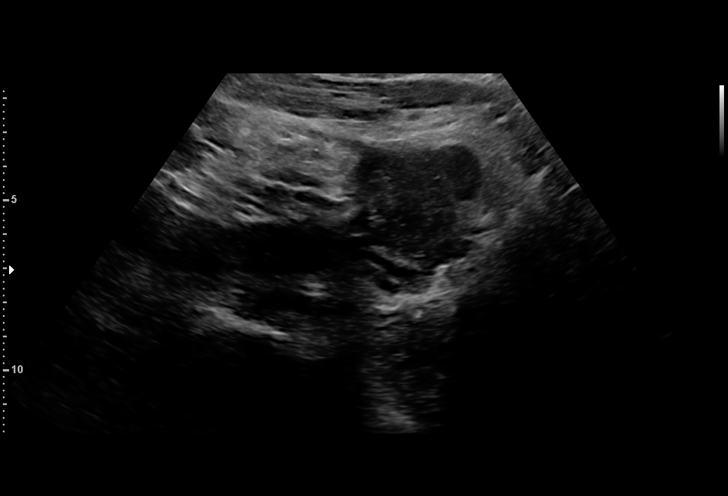
[im 17/74]
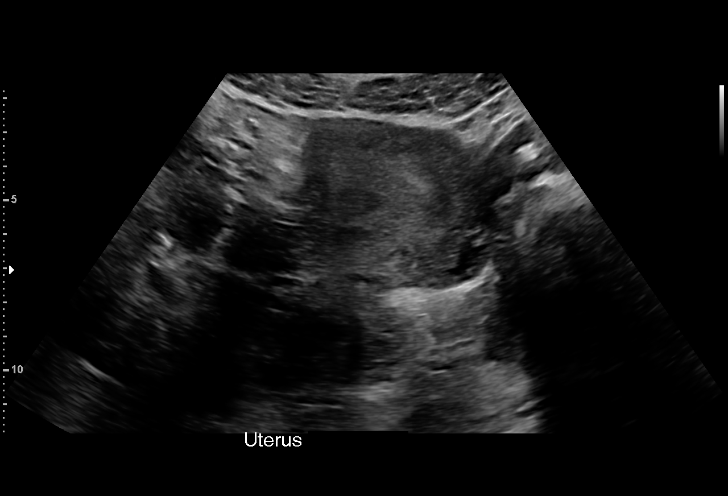
[im 23/74]
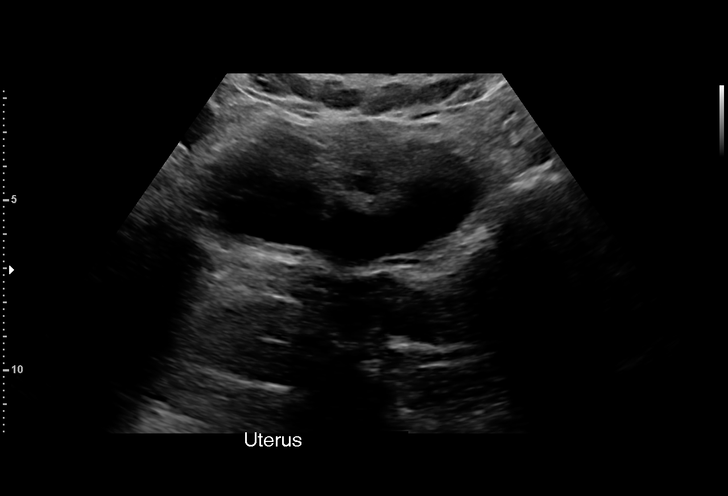
[im 29/74]
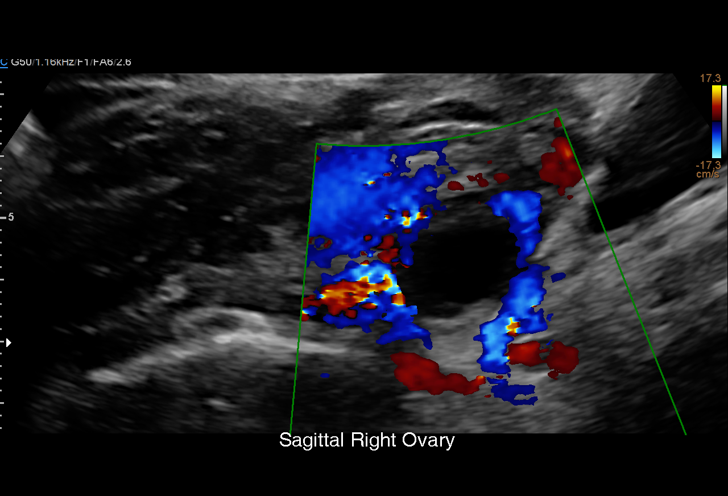
[im 34/74]
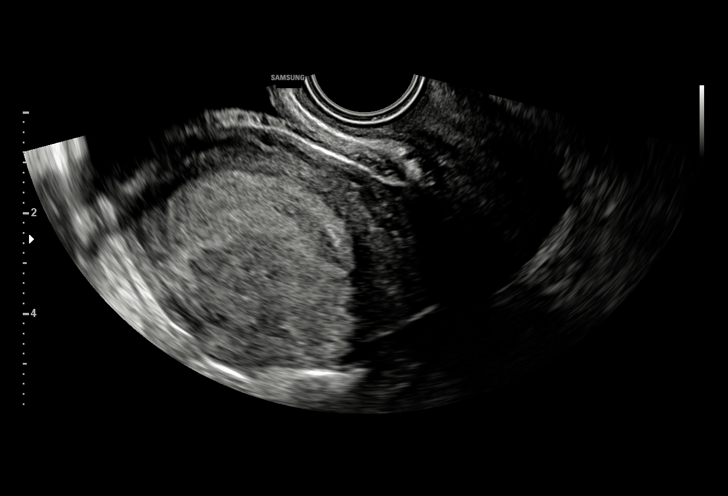
[im 40/74]
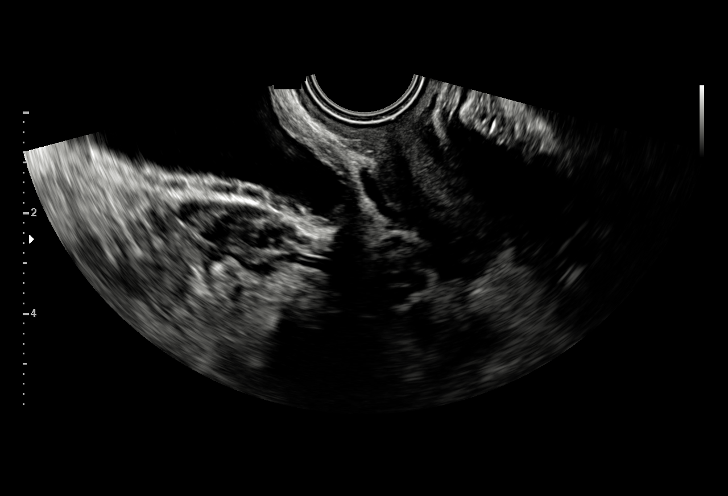
[im 43/74]
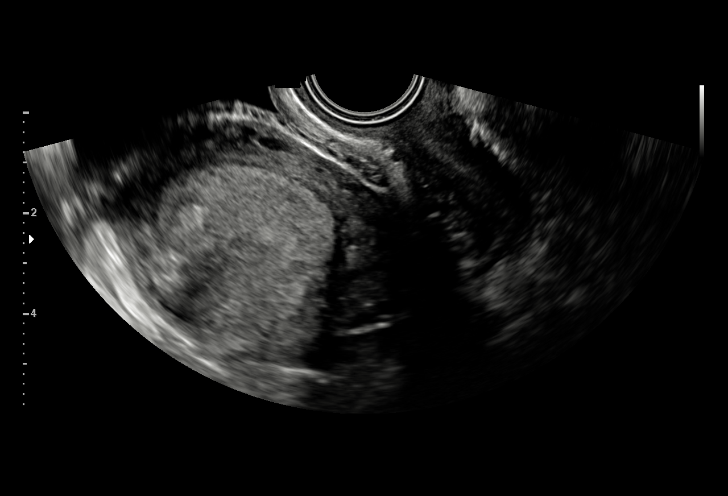
[im 48/74]
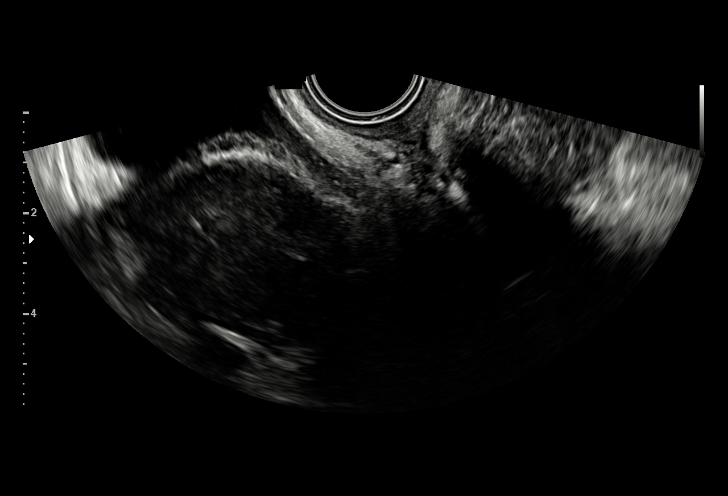
[im 54/74]
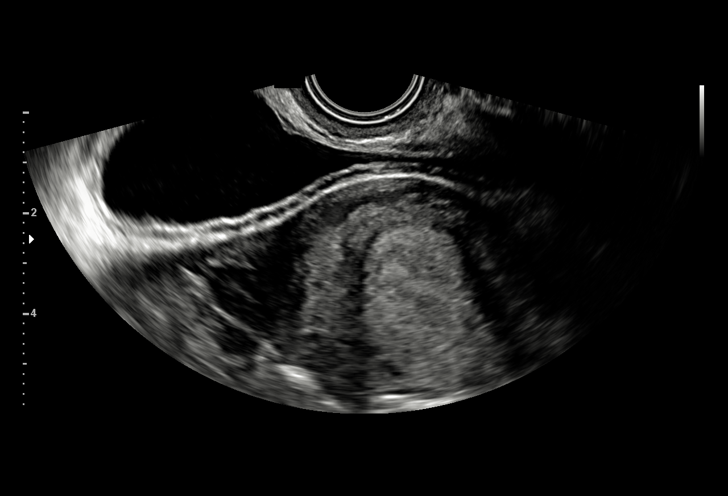
[im 59/74]
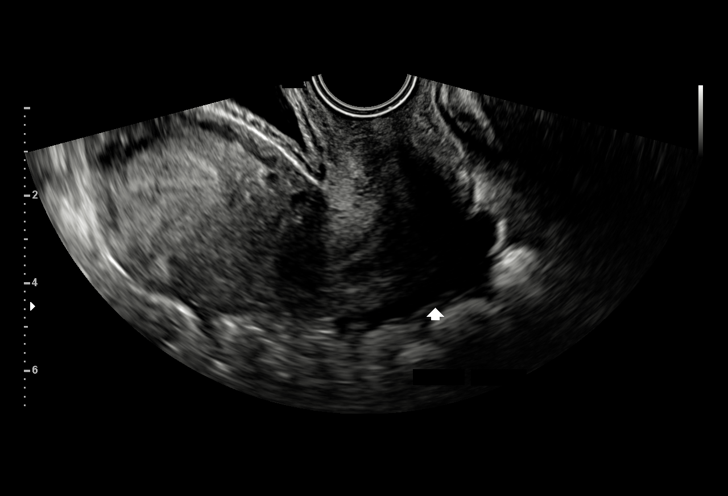
[im 65/74]
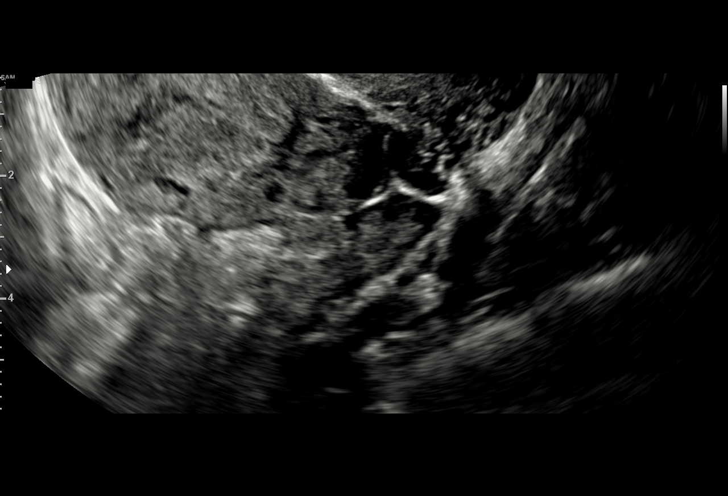
[im 71/74]
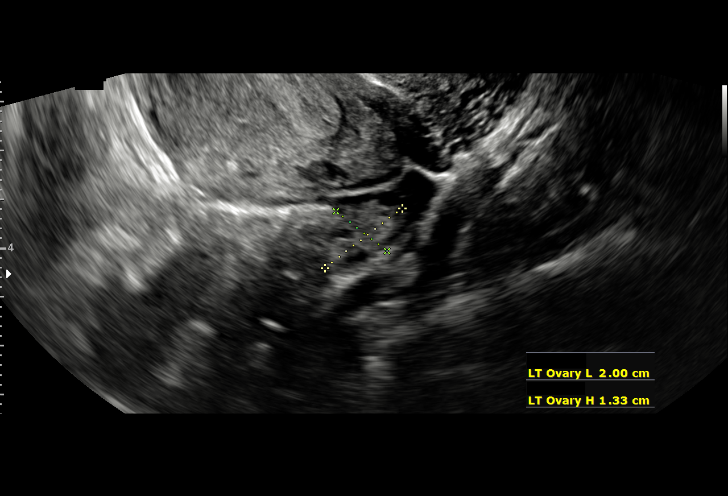

[Series 2: us ob < 14 weeks - us ob tv · 1 of 2 slices shown (2 of 2)]
[im 1/2]
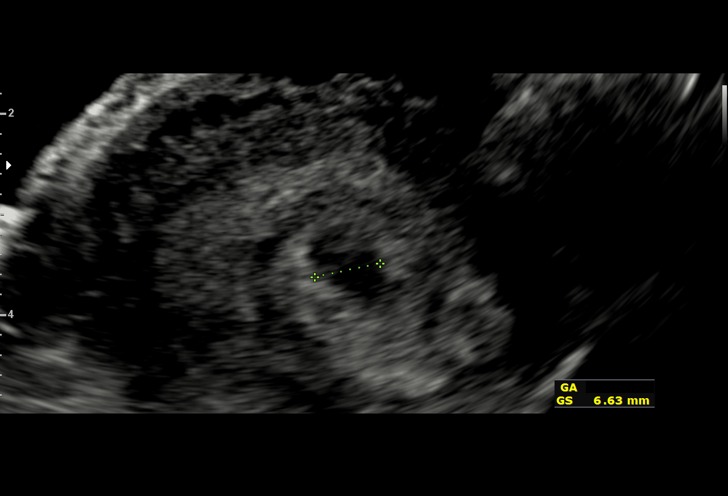

[15 of 28 positions shown; findings below may reference images not displayed]

FINDINGS: Intrauterine gestational sac: Single

Yolk sac:  Not Visualized.

Embryo:  Not Visualized.

Cardiac Activity: Not applicable

MSD: 7.56 mm   5 w   3 d

Subchorionic hemorrhage:  None visualized.

Maternal uterus/adnexae: No uterine masses. Cervix is closed.
Ovaries normal in size and appearance. Small amount of cul-de-sac
pelvic free fluid that is likely physiologic.
IMPRESSION: 1. Probable early intrauterine gestational sac, but no yolk sac,
fetal pole, or cardiac activity yet visualized. Recommend follow-up
quantitative B-HCG levels and follow-up US in 14 days to assess
viability. This recommendation follows SRU consensus guidelines:
Diagnostic Criteria for Nonviable Pregnancy Early in the First
Trimester. N Engl J Med 9289; [DATE].
2. Gestational sac size consistent with a 5 week 3 day pregnancy.
3. No emergent abnormalities.  Ovaries and adnexa are unremarkable.

## 2021-07-14 IMAGING — US US OB TRANSVAGINAL
1 series · 15 of 28 positions shown · non-contrast
Comparison: Pelvic ultrasound dated 08/24/2019.

CLINICAL DATA: Inconclusive fetal viability. Beta hCG was 14,181 on
08/24/2019. Last menstrual period was 07/20/2019.

EXAM:
TRANSVAGINAL OB ULTRASOUND
TECHNIQUE: Transvaginal ultrasound was performed for complete evaluation of the
gestation as well as the maternal uterus, adnexal regions, and
pelvic cul-de-sac.

[Series 1: us ob transvaginal · 15 of 64 slices shown]
[im 1/64]
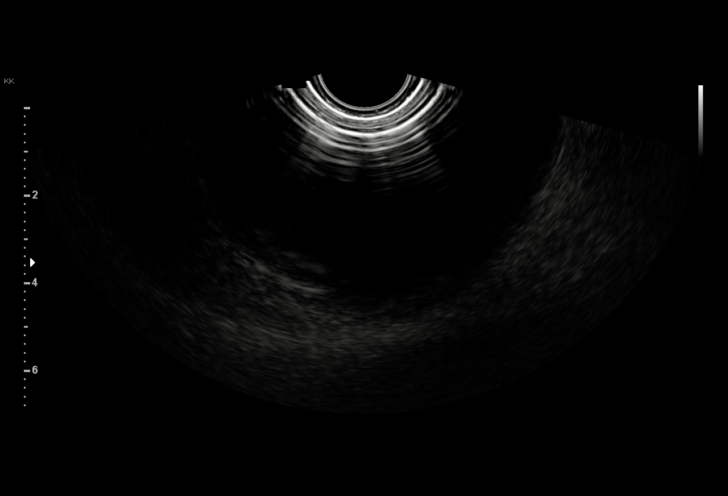
[im 5/64]
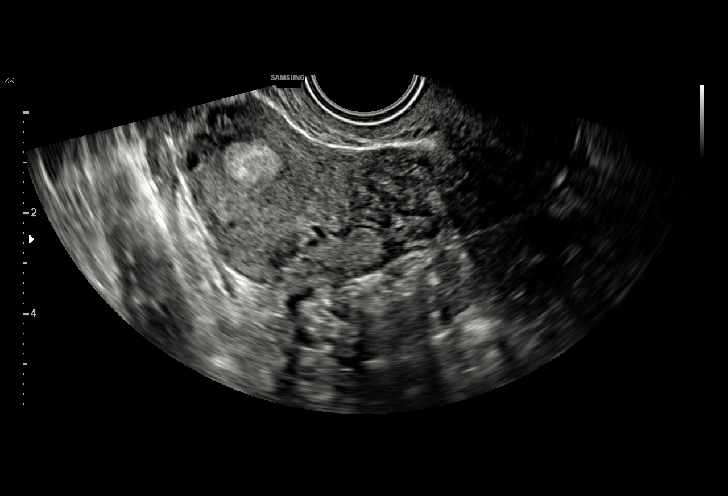
[im 10/64]
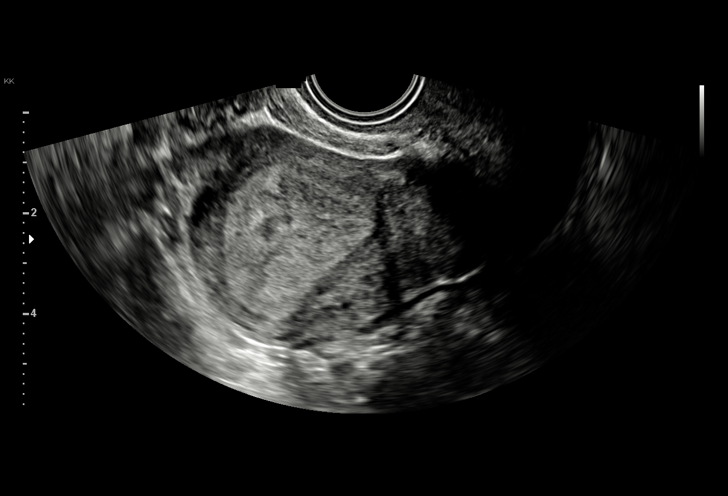
[im 15/64]
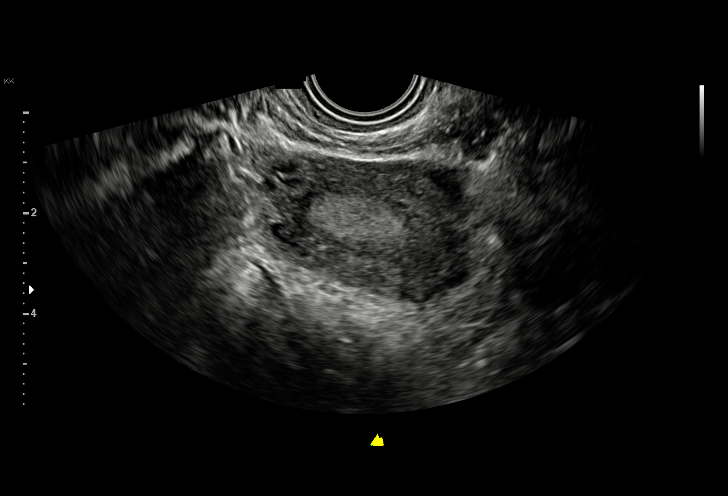
[im 19/64]
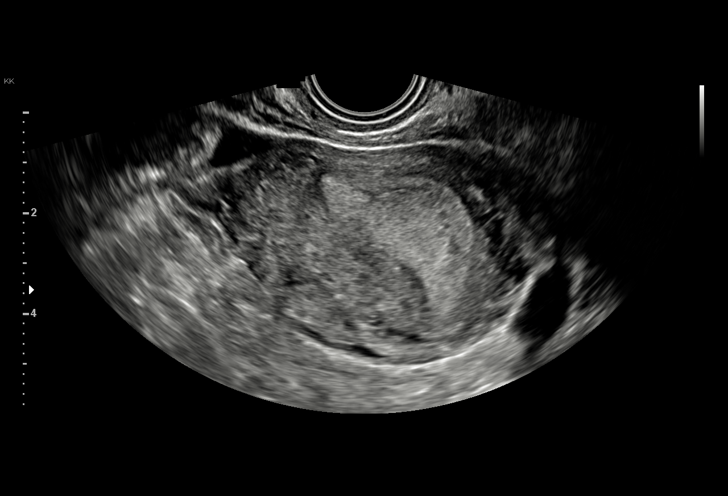
[im 24/64]
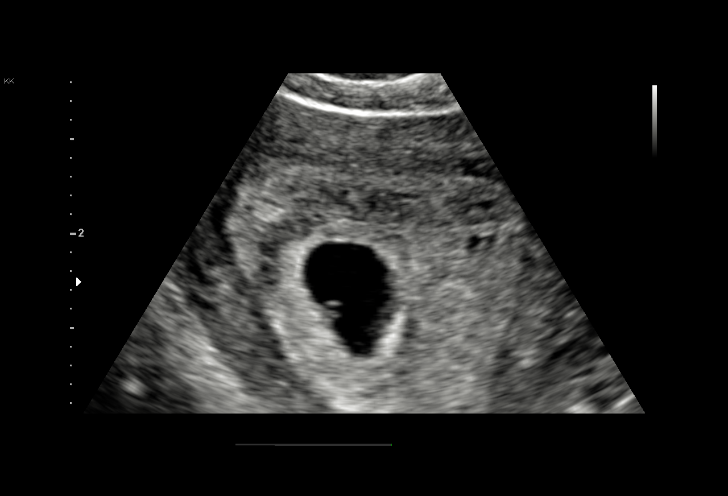
[im 29/64]
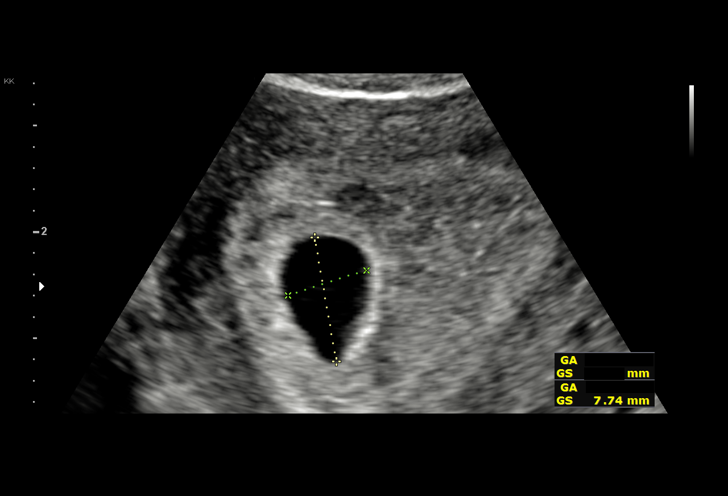
[im 33/64]
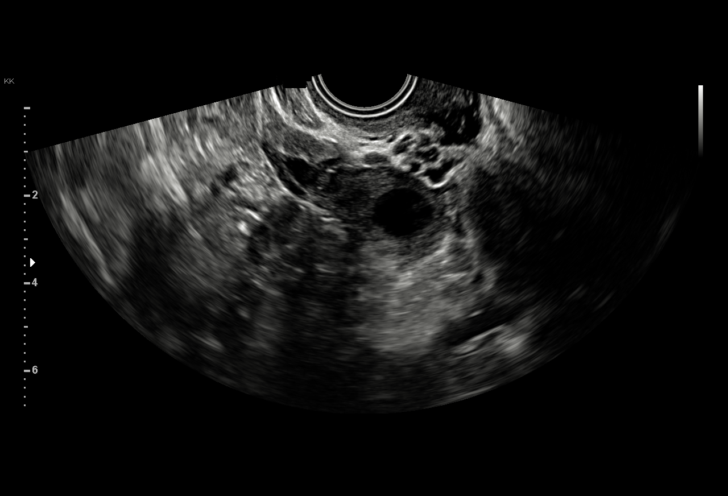
[im 36/64]
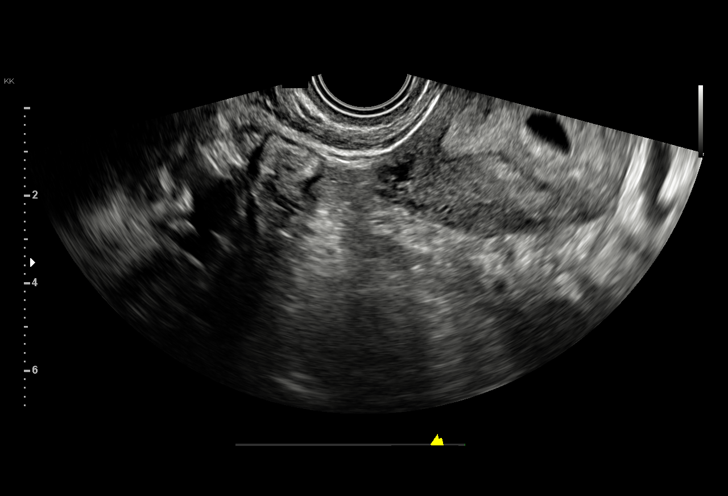
[im 40/64]
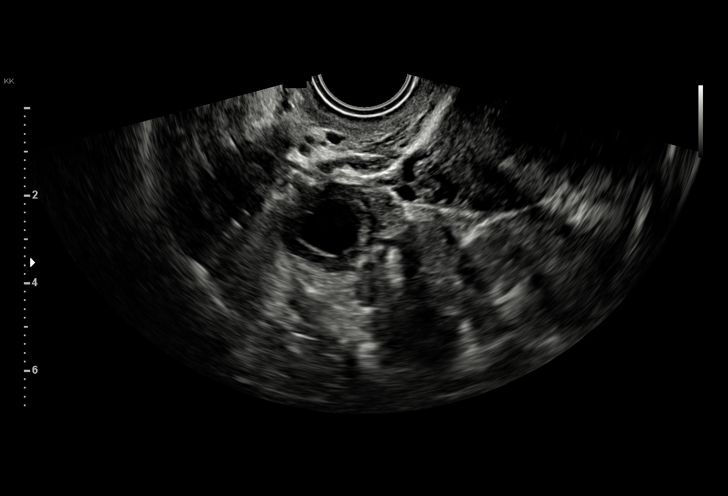
[im 45/64]
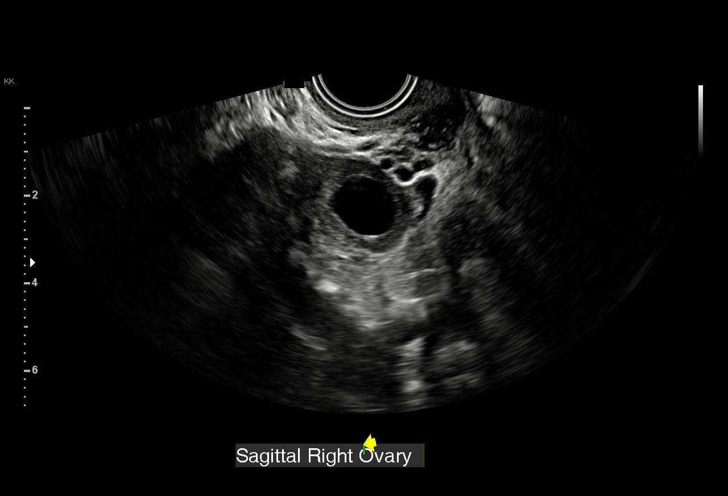
[im 50/64]
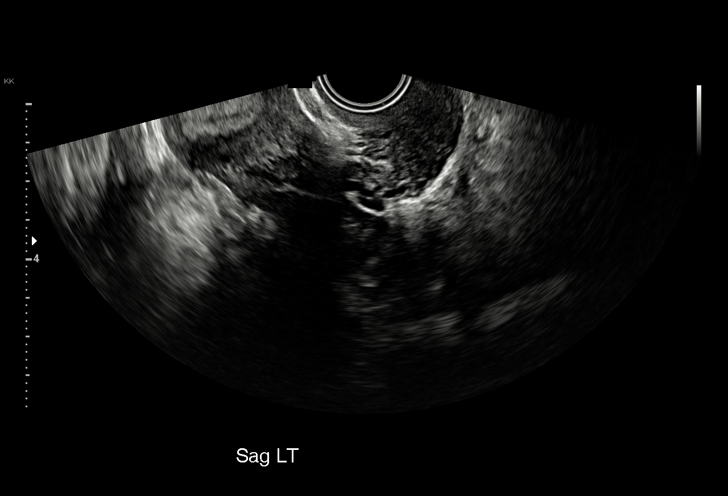
[im 54/64]
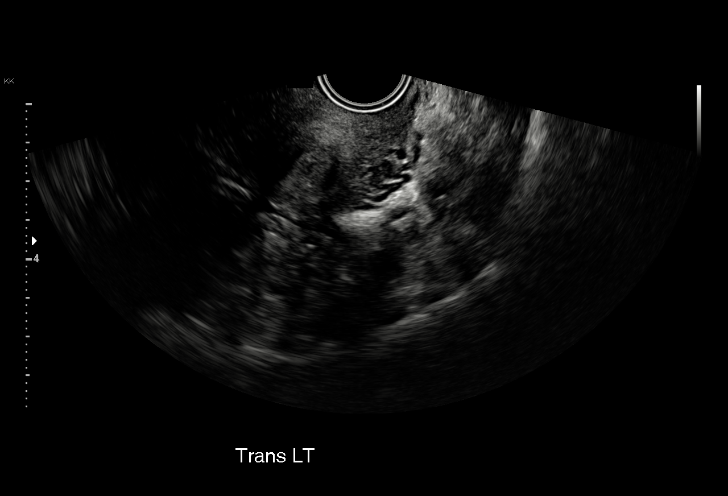
[im 59/64]
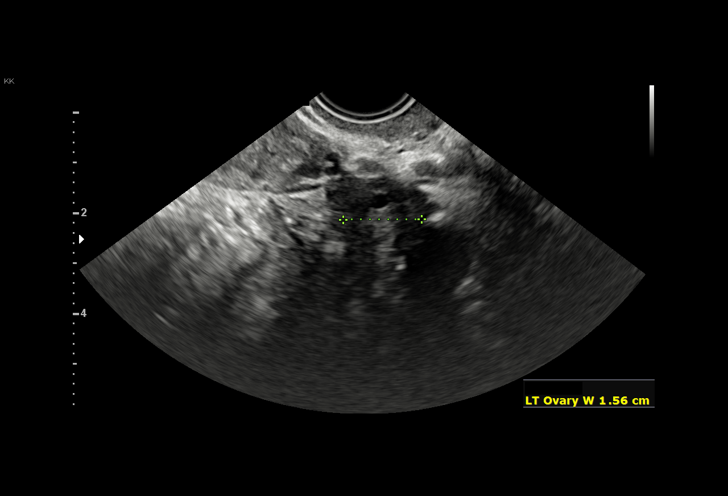
[im 64/64]
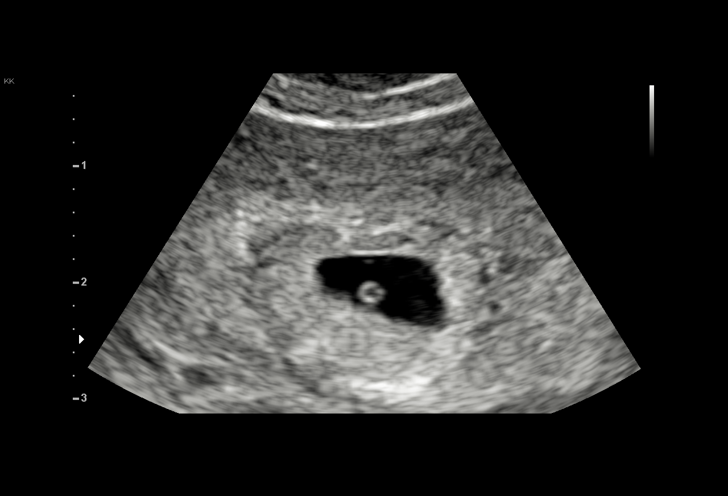

[15 of 28 positions shown; findings below may reference images not displayed]

FINDINGS: Intrauterine gestational sac: Single

Yolk sac:  Visualized.

Embryo:  Not Visualized.

Cardiac Activity: Not applicable

MSD: 9.1 mm   5 w   5 d

Subchorionic hemorrhage:  None visualized.

Maternal uterus/adnexae: No abnormality identified. No free fluid
identified.
IMPRESSION: Single gestational sac with a yolk sac visualized.

## 2021-08-26 ENCOUNTER — Ambulatory Visit (INDEPENDENT_AMBULATORY_CARE_PROVIDER_SITE_OTHER): Payer: BC Managed Care – PPO

## 2021-08-26 ENCOUNTER — Ambulatory Visit (INDEPENDENT_AMBULATORY_CARE_PROVIDER_SITE_OTHER): Payer: BC Managed Care – PPO | Admitting: Family Medicine

## 2021-08-26 ENCOUNTER — Other Ambulatory Visit: Payer: Self-pay

## 2021-08-26 VITALS — BP 118/66 | HR 82 | Temp 97.3°F | Ht 64.0 in | Wt 178.2 lb

## 2021-08-26 DIAGNOSIS — E669 Obesity, unspecified: Secondary | ICD-10-CM | POA: Diagnosis not present

## 2021-08-26 DIAGNOSIS — M25552 Pain in left hip: Secondary | ICD-10-CM | POA: Diagnosis not present

## 2021-08-26 DIAGNOSIS — E66811 Obesity, class 1: Secondary | ICD-10-CM | POA: Insufficient documentation

## 2021-08-26 MED ORDER — NAPROXEN 500 MG PO TABS: 500.0000 mg | ORAL_TABLET | Freq: Two times a day (BID) | ORAL | 1 refills | Status: DC

## 2021-08-26 NOTE — Patient Instructions (Signed)
Relative rest for the hip. Consider cardio exercises only for 1 week. Ice the hip for 20 min. after exercise. Take naproxen twice a day for 1 week. If not improved, contact Dr. Veto Kemps about Sports Medicine referral.

## 2021-08-26 NOTE — Progress Notes (Signed)
[facility] [address] [place] [address] Dept: [phone] Dept Fax: [phone]  Office Visit  Subjective:    Patient ID: Olivia Hayes, female    DOB: Dec 17, 1995, 26 y.o..   MRN: [id number]  Chief Complaint  Patient presents with   Acute Visit    C/o having LT hip pain x 3 months.  Has only used Bengay cream with little relief.      History of Present Illness:  Patient is in today for with a 37-month history of left hip pain. She does not recall any specific injury. She notes the pain was esp. intense yesterday. Ms. Witting does work out 4-5 days a week at a gym. She is doing both weight training and cardio, including squats, lunges, and elliptical. She wonders if her technique might be off leading to her pain issue.  Ms. Detwiler also complains of her weight. being an issue. she notes that she is working out very regularly, as noted above, and  following a health diet, avoiding breads and simple carbs, eating lean proteins, etc. She feels frustrated that she has not been about to get back to her prepregnancy weight (164 lbs).   Past Medical History: Patient Active Problem List   Diagnosis Date Noted   Aphthous ulcer of mouth 03/17/2021   Drug reaction 03/17/2021   History of ecchymosis 03/17/2021   Trichomoniasis 12/28/2020   Intrauterine device 12/22/2020   Seasonal allergic rhinitis 09/12/2020   Rectal bleeding 06/12/2020   History of DVT (deep vein thrombosis) 08/24/2019   Herpes simplex 10/15/2018   Inattention 01/06/2018   Globus sensation 07/11/2015   Acne 03/28/2013   Multiple allergies 02/21/2013   Past Surgical History:  Procedure Laterality Date   ABDOMINAL HERNIA REPAIR  age 64   INDUCED ABORTION     Family History  Problem Relation Age of Onset   Thyroid disease Mother    Colon cancer Neg Hx    Pancreatic cancer Neg Hx    Esophageal cancer Neg Hx    Stomach cancer Neg Hx    Rectal  cancer Neg Hx    Liver cancer Neg Hx     Outpatient Medications Prior to Visit  Medication Sig Dispense Refill   Apple Cider Vinegar 300 MG TABS Take by mouth.     Ascorbic Acid (VITAMIN C) 1000 MG tablet Take 1,000 mg by mouth daily.     COLLAGEN PO Take by mouth.     Multiple Vitamin (MULTIVITAMIN) tablet Take 1 tablet by mouth daily.     paragard intrauterine copper IUD IUD 1 each by Intrauterine route once.     vitamin B-12 (CYANOCOBALAMIN) 100 MCG tablet Take 100 mcg by mouth daily.     amoxicillin-clavulanate (AUGMENTIN) 875-125 MG tablet Take 1 tablet by mouth every 12 (twelve) hours. 14 tablet 0   magic mouthwash (nystatin, lidocaine, diphenhydrAMINE, alum & mag hydroxide) suspension Swish and swallow 15 mLs 4 (four) times daily as needed for mouth pain. 180 mL 0   No facility-administered medications prior to visit.   Allergies  Allergen Reactions   Peanuts [Peanut Oil] Anaphylaxis and Hives    Objective:   Today's Vitals   08/26/21 1003  BP: 118/66  Pulse: 82  Temp: (!) 97.3 F (36.3 C)  TempSrc: Temporal  SpO2: 98%  Weight: 178 lb 3.2 oz (80.8 kg)  Height: 5\' 4"  (1.626 m)   Body mass index is 30.59 kg/m.   General: Well developed, well nourished. No  acute distress. Extremities: Pain indicated over the anterior to anterolateral aspect of the hip. Full ROM. No joint swelling or   tenderness. Increased pain cannot be elicited with log-rolling or FABER. Strength is normal with hip   flexors, extensors, abductors and adductors. Psych: Alert and oriented x3. Normal mood and affect.  There are no preventive care reminders to display for this patient.  Imaging: Left hip x-ray- No fracture, dislocation or deformity. No sign of arthritis.  Assessment & Plan:   1. Left hip pain The etiology of the pain is unclear. I am concerned that she may be over training with weights. It is also possible that her technique could be causing some muscle/tendon strain. I recommend  she use relative rest (she is choosing to lay off weight training for 1 week), ice the hip after exercise, and take naproxen bid for 1 week. If her symptoms are not improving with this, I will refer her to Sports Medicine for further evaluation.  - DG Hip Unilat W OR W/O Pelvis 2-3 Views Left - naproxen (NAPROSYN) 500 MG tablet; Take 1 tablet (500 mg total) by mouth 2 (two) times daily with a meal.  Dispense: 14 tablet; Refill: 1  2. Obesity (BMI 30.0-34.9) We discussed the challenges with weight loss. She is following a healthy diet and is working out consistently. I recommend she try increasing her cardiovascular activities and limit her weight training to twice a week to see if this will start her back to losing weight.  Return in about 2 weeks (around 09/09/2021), or if symptoms worsen or fail to improve.   Loyola Mast, MD

## 2021-09-16 ENCOUNTER — Encounter: Payer: Self-pay | Admitting: Family Medicine

## 2021-09-16 DIAGNOSIS — M25552 Pain in left hip: Secondary | ICD-10-CM

## 2021-09-24 ENCOUNTER — Ambulatory Visit: Payer: BC Managed Care – PPO | Admitting: Family Medicine

## 2021-09-25 ENCOUNTER — Ambulatory Visit: Payer: Self-pay

## 2021-09-25 ENCOUNTER — Encounter: Payer: Self-pay | Admitting: Family Medicine

## 2021-09-25 ENCOUNTER — Ambulatory Visit (INDEPENDENT_AMBULATORY_CARE_PROVIDER_SITE_OTHER): Payer: BC Managed Care – PPO | Admitting: Family Medicine

## 2021-09-25 VITALS — BP 100/70 | Ht 64.0 in | Wt 175.0 lb

## 2021-09-25 DIAGNOSIS — M25559 Pain in unspecified hip: Secondary | ICD-10-CM

## 2021-09-25 DIAGNOSIS — M84350A Stress fracture, pelvis, initial encounter for fracture: Secondary | ICD-10-CM | POA: Diagnosis not present

## 2021-09-25 NOTE — Assessment & Plan Note (Signed)
Acute on chronic in nature.  Symptoms seem most consistent with stress reaction versus fracture.  Has been pushing through the pain recently which likely has been exacerbating the area. ?-Counseled on home exercise therapy and supportive care. ?-Could consider physical therapy or further imaging. ?

## 2021-09-25 NOTE — Progress Notes (Signed)
?  Olivia Hayes - 26 y.o. female MRN [id number]  Date of birth: Feb 08, 1996 ? ?SUBJECTIVE:  Including CC & ROS.  ?No chief complaint on file. ? ? ?Olivia Hayes is a 26 y.o. female that is presenting with a 19-month history of left hip pain.  The pain has been ongoing and gotten worse recently.  It is localized to the anterior lateral aspect of the hip.  She has been working out. ? ?Review of the chart from 2/22 shows she was provided naproxen. ?Independent review of the left hip x-ray from 2/22 shows no acute changes. ? ?Review of Systems ?See HPI  ? ?HISTORY: Past Medical, Surgical, Social, and Family History Reviewed & Updated per EMR.   ?Pertinent Historical Findings include: ? ?Past Medical History:  ?Diagnosis Date  ? Asthma   ? exercise induced  ? BV (bacterial vaginosis)   ? Depression   ? DVT (deep venous thrombosis) (HCC)   ? provoked by [facility] OCPs   ? HSV-1 (herpes simplex virus 1) infection   ? noted on genital   ? Pelvic floor dysfunction   ? Yeast infection   ? ? ?Past Surgical History:  ?Procedure Laterality Date  ? ABDOMINAL HERNIA REPAIR  age 19  ? INDUCED ABORTION    ? ? ? ?PHYSICAL EXAM:  ?VS: BP 100/70 (BP Location: Left Arm, Patient Position: Sitting)   Ht 5\' 4"  (1.626 m)   Wt 175 lb (79.4 kg)   BMI 30.04 kg/m?  ?Physical Exam ?Gen: NAD, alert, cooperative with exam, well-appearing ?MSK:  ?Neurovascularly intact   ? ?Limited ultrasound: Left hip: ? ?No changes in the joint space. ?Normal-appearing ASIS and AIIS. ?There is increased hyperemia over the lateral portion of the anterior pelvic rim. ?Normal-appearing musculature ? ?Summary: Findings most consistent with stress fracture of the pelvic rim. ? ?Ultrasound and interpretation by , MD ? ? ? ?ASSESSMENT & PLAN:  ? ?Stress fx pelvis ?Acute on chronic in nature.  Symptoms seem most consistent with stress reaction versus fracture.  Has been pushing through the pain recently which likely has been exacerbating the area. ?-Counseled on  home exercise therapy and supportive care. ?-Could consider physical therapy or further imaging. ? ? ? ? ?

## 2021-09-25 NOTE — Patient Instructions (Signed)
Nice to meet you ?Please try ice  ?Please try the exercises  ?Please avoid anything that causes pain   ?Please send me a message in MyChart with any questions or updates.  ?Please see me back in 4 weeks.  ? ?--Dr. Jordan Likes ? ?

## 2021-10-07 ENCOUNTER — Ambulatory Visit (INDEPENDENT_AMBULATORY_CARE_PROVIDER_SITE_OTHER): Payer: BC Managed Care – PPO | Admitting: Family Medicine

## 2021-10-07 VITALS — BP 112/68 | HR 105 | Temp 97.9°F | Ht 64.0 in | Wt 177.8 lb

## 2021-10-07 DIAGNOSIS — H8112 Benign paroxysmal vertigo, left ear: Secondary | ICD-10-CM

## 2021-10-07 DIAGNOSIS — J029 Acute pharyngitis, unspecified: Secondary | ICD-10-CM

## 2021-10-07 NOTE — Patient Instructions (Signed)
Benign Positional Vertigo ? ?Vertigo is the feeling that you or your surroundings are moving when they are not. Benign positional vertigo is the most common form of vertigo. This is usually a harmless condition (benign). This condition is positional. This means that symptoms are triggered by certain movements and positions. ? ?This condition can be dangerous if it occurs while you are doing something that could cause harm to yourself or others. This includes activities such as driving or operating machinery. ? ?What are the causes? ?The inner ear has fluid-filled canals that help your brain sense movement and balance. When the fluid moves, the brain receives messages about your body's position. ? ?With benign positional vertigo, calcium crystals in the inner ear break free and disturb the inner ear area. This causes your brain to receive confusing messages about your body's position. ? ?What increases the risk? ?You are more likely to develop this condition if: ?You are a woman. ?You are 25 years of age or older. ?You have recently had a head injury. ?You have an inner ear disease. ? ?What are the signs or symptoms? ?Symptoms of this condition usually happen when you move your head or your eyes in different directions. Symptoms may start suddenly and usually last for less than a minute. They include: ?Loss of balance and falling. ?Feeling like you are spinning or moving. ?Feeling like your surroundings are spinning or moving. ?Nausea and vomiting. ?Blurred vision. ?Dizziness. ?Involuntary eye movement (nystagmus). ? ?Symptoms can be mild and cause only minor problems, or they can be severe and interfere with daily life. Episodes of benign positional vertigo may return (recur) over time. Symptoms may also improve over time. ? ?How is this diagnosed? ?This condition may be diagnosed based on: ?Your medical history. ?A physical exam of the head, neck, and ears. ?Positional tests to check for or stimulate vertigo. You  may be asked to turn your head and change positions, such as going from sitting to lying down. A health care provider will watch for symptoms of vertigo. ? ?You may be referred to a health care provider who specializes in ear, nose, and throat problems (ENT or otolaryngologist) or a provider who specializes in disorders of the nervous system (neurologist). ? ?How is this treated? ?This condition may be treated in a session in which your health care provider moves your head in specific positions to help the displaced crystals in your inner ear move. Treatment for this condition may take several sessions. Surgery may be needed in severe cases, but this is rare. ? ?In some cases, benign positional vertigo may resolve on its own in 2-4 weeks. ? ?Follow these instructions at home: ?Safety ?Move slowly. Avoid sudden body or head movements or certain positions, as told by your health care provider. ?Avoid driving or operating machinery until your health care provider says it is safe. ?Avoid doing any tasks that would be dangerous to you or others if vertigo occurs. ?If you have trouble walking or keeping your balance, try using a cane for stability. If you feel dizzy or unstable, sit down right away. ?Return to your normal activities as told by your health care provider. Ask your health care provider what activities are safe for you. ? ?General instructions ?Take over-the-counter and prescription medicines only as told by your health care provider. ?Drink enough fluid to keep your urine pale yellow. ?Keep all follow-up visits. This is important. ? ?Contact a health care provider if: ?You have a fever. ?Your condition gets worse  or you develop new symptoms. ?Your family or friends notice any behavioral changes. ?You have nausea or vomiting that gets worse. ?You have numbness or a prickling and tingling sensation. ? ?Get help right away if you: ?Have difficulty speaking or moving. ?Are always dizzy or faint. ?Develop severe  headaches. ?Have weakness in your legs or arms. ?Have changes in your hearing or vision. ?Develop a stiff neck. ?Develop sensitivity to light. ? ? ?Summary ?Vertigo is the feeling that you or your surroundings are moving when they are not. Benign positional vertigo is the most common form of vertigo. ?This condition is caused by calcium crystals in the inner ear that become displaced. This causes a disturbance in an area of the inner ear that helps your brain sense movement and balance. ?Symptoms include loss of balance and falling, feeling that you or your surroundings are moving, nausea and vomiting, and blurred vision. ?This condition can be diagnosed based on symptoms, a physical exam, and positional tests. ?Follow safety instructions as told by your health care provider and keep all follow-up visits. This is important. ? ?This information is not intended to replace advice given to you by your health care provider. Make sure you discuss any questions you have with your health care provider. ? ? ?How to Perform the Epley Maneuver ? ?The Epley maneuver is an exercise that relieves symptoms of vertigo. Vertigo is the feeling that you or your surroundings are moving when they are not. When you feel vertigo, you may feel like the room is spinning and may have trouble walking. The Epley maneuver is used for a type of vertigo caused by a calcium deposit in a part of the inner ear. The maneuver involves changing head positions to help the deposit move out of the area. ? ?You can do this maneuver at home whenever you have symptoms of vertigo. You can repeat it in 24 hours if your vertigo has not gone away. ? ?Even though the Epley maneuver may relieve your vertigo for a few weeks, it is possible that your symptoms will return. This maneuver relieves vertigo, but it does not relieve dizziness. ? ?What are the risks? ?If it is done correctly, the Epley maneuver is considered safe. Sometimes it can lead to dizziness or  nausea that goes away after a short time. If you develop other symptoms--such as changes in vision, weakness, or numbness--stop doing the maneuver and call your health care provider. ? ?Supplies needed: ?A bed or table. ?A pillow. ? ?How to do the Epley maneuver ?  ? ? ?Sit on the edge of a bed or table with your back straight and your legs extended or hanging over the edge of the bed or table. ?Turn your head halfway toward the affected ear or side as told by your health care provider. ?Lie backward quickly with your head turned until you are lying flat on your back. Your head should dangle (head-hanging position). You may want to position a pillow under your shoulders. ? ? ? ? ?Hold this position for at least 30 seconds. If you feel dizzy or have symptoms of vertigo, continue to hold the position until the symptoms stop. ?Turn your head to the opposite direction until your unaffected ear is facing down. Your head should continue to dangle. ?Hold this position for at least 30 seconds. If you feel dizzy or have symptoms of vertigo, continue to hold the position until the symptoms stop. ?Turn your whole body to the same side as your  head so that you are positioned on your side. Your head will now be nearly facedown and no longer needs to dangle. Hold for at least 30 seconds. If you feel dizzy or have symptoms of vertigo, continue to hold the position until the symptoms stop. ?Sit back up. ? ?You can repeat the maneuver in 24 hours if your vertigo does not go away. ? ?Follow these instructions at home: ?For 24 hours after doing the Epley maneuver: ?Keep your head in an upright position. ?When lying down to sleep or rest, keep your head raised (elevated) with two or more pillows. ?Avoid excessive neck movements. ? ?Activity ?Do not drive or use machinery if you feel dizzy. ?After doing the Epley maneuver, return to your normal activities as told by your health care provider. Ask your health care provider what  activities are safe for you. ? ?General instructions ?Drink enough fluid to keep your urine pale yellow. ?Do not drink alcohol. ?Take over-the-counter and prescription medicines only as told by your health care pro

## 2021-10-07 NOTE — Progress Notes (Signed)
?[facility] ?[facility] ?[address] ? [place] [address] ?Dept: [phone] ?Dept Fax: [phone] ? ?Office Visit ? ?Subjective:  ? ? Patient ID: Olivia Hayes, female    DOB: Nov 14, 1995, 26 y.o..   MRN: [id number] ? ?Chief Complaint  ?Patient presents with  ? Acute Visit  ?  C/o having dizziness/spinning for 4 days. Sore throat, congestion and fatigue for 1 day.  ? ? ?History of Present Illness: ? ?Patient is in today for evaluation of a 4-day history of dizziness. She notes it feels like the room is spinning around her. She had been fasting for Ramadan. At her mother's advice, she broke her fast, but has still not been feeling better. In the last 1 day she has had some sore throat, congestion, and fatigue. ? ?Past Medical History: ?Patient Active Problem List  ? Diagnosis Date Noted  ? Stress fx pelvis 09/25/2021  ? Obesity (BMI 30.0-34.9) 08/26/2021  ? Aphthous ulcer of mouth 03/17/2021  ? Drug reaction 03/17/2021  ? History of ecchymosis 03/17/2021  ? Trichomoniasis 12/28/2020  ? Intrauterine device 12/22/2020  ? Seasonal allergic rhinitis 09/12/2020  ? Rectal bleeding 06/12/2020  ? History of DVT (deep vein thrombosis) 08/24/2019  ? Herpes simplex 10/15/2018  ? Inattention 01/06/2018  ? Globus sensation 07/11/2015  ? Acne 03/28/2013  ? Multiple allergies 02/21/2013  ? ?Past Surgical History:  ?Procedure Laterality Date  ? ABDOMINAL HERNIA REPAIR  age 61  ? INDUCED ABORTION    ? ?Family History  ?Problem Relation Age of Onset  ? Thyroid disease Mother   ? Colon cancer Neg Hx   ? Pancreatic cancer Neg Hx   ? Esophageal cancer Neg Hx   ? Stomach cancer Neg Hx   ? Rectal cancer Neg Hx   ? Liver cancer Neg Hx   ? ?Outpatient Medications Prior to Visit  ?Medication Sig Dispense Refill  ? Apple Cider Vinegar 300 MG TABS Take by mouth.    ? Ascorbic Acid (VITAMIN C) 1000 MG tablet Take 1,000 mg by mouth daily.    ? COLLAGEN PO Take by mouth.    ? magnesium 30 MG tablet  Take 30 mg by mouth 2 (two) times daily.    ? Multiple Vitamin (MULTIVITAMIN) tablet Take 1 tablet by mouth daily.    ? paragard intrauterine copper IUD IUD 1 each by Intrauterine route once.    ? vitamin B-12 (CYANOCOBALAMIN) 100 MCG tablet Take 100 mcg by mouth daily.    ? naproxen (NAPROSYN) 500 MG tablet Take 1 tablet (500 mg total) by mouth 2 (two) times daily with a meal. 14 tablet 1  ? ?No facility-administered medications prior to visit.  ? ?Allergies  ?Allergen Reactions  ? Peanuts [Peanut Oil] Anaphylaxis and Hives  ? Naproxen Hives  ?  ?Objective:  ? ?Today's Vitals  ? 10/07/21 1334  ?BP: 112/68  ?Pulse: (!) 105  ?Temp: 97.9 ?F (36.6 ?C)  ?TempSrc: Temporal  ?SpO2: 98%  ?Weight: 177 lb 12.8 oz (80.6 kg)  ?Height: 5\' 4"  (1.626 m)  ? ?Body mass index is 30.52 kg/m?.  ? ?General: Well developed, well nourished. No acute distress. ?HEENT: Normocephalic, non-traumatic. PERRL, EOMI. Conjunctiva clear. External ears normal.  ? EAC and TMs normal bilaterally. Patient has provoked nystagmus with a Dix-Halpike maneuver to  ? the left. None with the same to the right. Oropharynx shows some white to gray patches of the  ? tonsils/posterior oropharynx. ?Neck: Supple. No lymphadenopathy.  ?Psych: Alert and oriented.  Normal mood and affect. ? ?There are no preventive care reminders to display for this patient. ?   ?Lab Results: ?Rapid Strep POC: Negative ?Assessment & Plan:  ? ?1. BPPV (benign paroxysmal positional vertigo), left ?I performed an Epley maneuver with [name]. I provided her with written information on BBPV, including instruction on repeating Epley maneuvers at home, and aftercare. She should try and keep hydrated right now, but may resume daily fasting if she feels up to it.  ? ?2. Viral pharyngitis ?Other symptoms consistent with a minor viral illness. ? ?Return if symptoms worsen or fail to improve.  ? ?Loyola Mast, MD ?

## 2021-10-13 ENCOUNTER — Ambulatory Visit (INDEPENDENT_AMBULATORY_CARE_PROVIDER_SITE_OTHER): Payer: BC Managed Care – PPO | Admitting: Family Medicine

## 2021-10-13 VITALS — BP 124/80 | HR 104 | Temp 97.5°F | Ht 64.0 in | Wt 177.6 lb

## 2021-10-13 DIAGNOSIS — J40 Bronchitis, not specified as acute or chronic: Secondary | ICD-10-CM | POA: Diagnosis not present

## 2021-10-13 DIAGNOSIS — J040 Acute laryngitis: Secondary | ICD-10-CM | POA: Diagnosis not present

## 2021-10-13 DIAGNOSIS — L0100 Impetigo, unspecified: Secondary | ICD-10-CM | POA: Diagnosis not present

## 2021-10-13 MED ORDER — HYDROCODONE BIT-HOMATROP MBR 5-1.5 MG/5ML PO SOLN: 5.0000 mL | Freq: Three times a day (TID) | ORAL | 0 refills | Status: DC | PRN

## 2021-10-13 MED ORDER — MUPIROCIN CALCIUM 2 % EX CREA: 1.0000 "application " | TOPICAL_CREAM | Freq: Two times a day (BID) | CUTANEOUS | 0 refills | Status: DC

## 2021-10-13 NOTE — Progress Notes (Signed)
?[facility] ?[facility] ?[address] ?[place] [address] ?Dept: [phone] ?Dept Fax: [phone] ? ?Office Visit ? ?Subjective:  ? ? Patient ID: Olivia Hayes, female    DOB: [birth year], 26 y.o..   MRN: [id number] ? ?Chief Complaint  ?Patient presents with  ? Acute Visit  ?  C/o losing her voice  and also go bit by a spinder on the face over the weekend.  Has been taking Allegra and Delsym with no relief.   ? ? ?History of Present Illness: ? ?Patient is in today for with a 4-5 day history of laryngitis. She notes this came on suddenly. It has improved mildly over the past day. She has an associated cough. She is finding that this has not improved despite use of Delsym.She is still drinking some warm liquids during the day, having not gone back to the traditional fasting for Ramadan. ? ?I had seem Olivia Hayes last week with BPPV. This has resolved completely since we did the Epley maneuver. ? ?Olivia Hayes also notes an infected area on her upper lip. She thinks this may have been due to a spider bite, though she saw no spider. She did see 2 small puncture wounds in the middle of the red area. She used a topical agent for spider bites and then noted this crusted up. She had had her upper lip waxed the day prior to this developing. ? ?Past Medical History: ?Patient Active Problem List  ? Diagnosis Date Noted  ? Stress fx pelvis 09/25/2021  ? Obesity (BMI 30.0-34.9) 08/26/2021  ? Aphthous ulcer of mouth 03/17/2021  ? Drug reaction 03/17/2021  ? History of ecchymosis 03/17/2021  ? Trichomoniasis 12/28/2020  ? Intrauterine device 12/22/2020  ? Seasonal allergic rhinitis 09/12/2020  ? Rectal bleeding 06/12/2020  ? History of DVT (deep vein thrombosis) 08/24/2019  ? Herpes simplex 10/15/2018  ? Inattention 01/06/2018  ? Globus sensation 07/11/2015  ? Acne 03/28/2013  ? Multiple allergies 02/21/2013  ? ?Past Surgical History:  ?Procedure Laterality Date  ? ABDOMINAL  HERNIA REPAIR  age 83  ? INDUCED ABORTION    ? ?Family History  ?Problem Relation Age of Onset  ? Thyroid disease Mother   ? Colon cancer Neg Hx   ? Pancreatic cancer Neg Hx   ? Esophageal cancer Neg Hx   ? Stomach cancer Neg Hx   ? Rectal cancer Neg Hx   ? Liver cancer Neg Hx   ? ?Outpatient Medications Prior to Visit  ?Medication Sig Dispense Refill  ? Apple Cider Vinegar 300 MG TABS Take by mouth.    ? Ascorbic Acid (VITAMIN C) 1000 MG tablet Take 1,000 mg by mouth daily.    ? COLLAGEN PO Take by mouth.    ? magnesium 30 MG tablet Take 30 mg by mouth 2 (two) times daily.    ? Multiple Vitamin (MULTIVITAMIN) tablet Take 1 tablet by mouth daily.    ? paragard intrauterine copper IUD IUD 1 each by Intrauterine route once.    ? vitamin B-12 (CYANOCOBALAMIN) 100 MCG tablet Take 100 mcg by mouth daily.    ? ?No facility-administered medications prior to visit.  ? ?Allergies  ?Allergen Reactions  ? Peanuts [Peanut Oil] Anaphylaxis and Hives  ? Naproxen Hives  ? ?   ?Objective:  ? ?Today's Vitals  ? 10/13/21 1531  ?BP: 124/80  ?Pulse: (!) 104  ?Temp: (!) 97.5 ?F (36.4 ?C)  ?TempSrc: Temporal  ?SpO2: 96%  ?Weight: 177 lb 9.6  oz (80.6 kg)  ?Height: 5\' 4"  (1.626 m)  ? ?Body mass index is 30.48 kg/m?.  ? ?General: Well developed, well nourished. No acute distress. ?HEENT: Normocephalic, non-traumatic. Conjunctiva clear. Mucous membranes moist. Oropharynx  ? clear. Good dentition. Hoarseness when talking. ?Neck: Supple. No lymphadenopathy. No thyromegaly. ?Lungs: Clear to auscultation bilaterally. No wheezing, rales or rhonchi. ?Skin: Warm and dry. There is a single red, raised lesion with central crusting on the right upper lip. ?Psych: Alert and oriented. Normal mood and affect. ? ?There are no preventive care reminders to display for this patient. ?   ?Assessment & Plan:  ? ?1. Laryngitis ?We discussed the viral nature of laryngitis. I recommend she continue to drink warm liquids. I urged Olivia Hayes to rest her voice as  much as she is able. This should resolve with time. ? ?2. Bronchitis ?I will add a cough syrup to try and reduce cough and improve sleep. ? ?- HYDROcodone bit-homatropine (HYCODAN) 5-1.5 MG/5ML syrup; Take 5 mLs by mouth every 8 (eight) hours as needed for cough.  Dispense: 120 mL; Refill: 0 ? ?3. Impetigo ?The lesion on the lip is consistent with a localized strep infection rather than a spider bite. I will treat with topical mupirocin. ? ?- mupirocin cream (BACTROBAN) 2 %; Apply 1 application. topically 2 (two) times daily.  Dispense: 15 g; Refill: 0 ? ? ?Return if symptoms worsen or fail to improve.  ? ? , MD ?

## 2021-10-30 ENCOUNTER — Ambulatory Visit (INDEPENDENT_AMBULATORY_CARE_PROVIDER_SITE_OTHER): Payer: BC Managed Care – PPO | Admitting: Family Medicine

## 2021-10-30 ENCOUNTER — Encounter: Payer: Self-pay | Admitting: Family Medicine

## 2021-10-30 DIAGNOSIS — M84350D Stress fracture, pelvis, subsequent encounter for fracture with routine healing: Secondary | ICD-10-CM | POA: Diagnosis not present

## 2021-10-30 NOTE — Assessment & Plan Note (Signed)
Has been doing well with the adjustments.  The pain has improved and still mild to the touch. ?-Counseled on home exercise therapy and supportive care. ?-Could consider further imaging if still present in another month. ?

## 2021-10-30 NOTE — Progress Notes (Signed)
?  [name] - 26 y.o. female MRN [id number]  Date of birth: 08-Jun-1996 ? ?SUBJECTIVE:  Including CC & ROS.  ?No chief complaint on file. ? ? ?Olivia Hayes is a 26 y.o. female that is following up for her pelvis pain.  She has adjusted her workout and the pain has improved.  She no longer feels the pain all the time.  There is some sensitivity to touch still. ? ? ?Review of Systems ?See HPI  ? ?HISTORY: Past Medical, Surgical, Social, and Family History Reviewed & Updated per EMR.   ?Pertinent Historical Findings include: ? ?Past Medical History:  ?Diagnosis Date  ? Asthma   ? exercise induced  ? BV (bacterial vaginosis)   ? Depression   ? DVT (deep venous thrombosis) (HCC)   ? provoked by [facility] OCPs   ? HSV-1 (herpes simplex virus 1) infection   ? noted on genital   ? Pelvic floor dysfunction   ? Yeast infection   ? ? ?Past Surgical History:  ?Procedure Laterality Date  ? ABDOMINAL HERNIA REPAIR  age 48  ? INDUCED ABORTION    ? ? ? ?PHYSICAL EXAM:  ?VS: BP 100/64 (BP Location: Left Arm, Patient Position: Sitting)   Ht 5\' 4"  (1.626 m)   Wt 177 lb (80.3 kg)   BMI 30.38 kg/m?  ?Physical Exam ?Gen: NAD, alert, cooperative with exam, well-appearing ?MSK:  ?Neurovascularly intact   ? ? ? ? ?ASSESSMENT & PLAN:  ? ?Stress fx pelvis ?Has been doing well with the adjustments.  The pain has improved and still mild to the touch. ?-Counseled on home exercise therapy and supportive care. ?-Could consider further imaging if still present in another month. ? ? ? ? ?

## 2021-11-18 ENCOUNTER — Other Ambulatory Visit (HOSPITAL_COMMUNITY)
Admission: RE | Admit: 2021-11-18 | Discharge: 2021-11-18 | Disposition: A | Discharge status: Home / Self Care | Payer: BC Managed Care – PPO | Source: Ambulatory Visit | Attending: Family Medicine | Admitting: Family Medicine

## 2021-11-18 ENCOUNTER — Ambulatory Visit (INDEPENDENT_AMBULATORY_CARE_PROVIDER_SITE_OTHER): Payer: BC Managed Care – PPO | Admitting: Family Medicine

## 2021-11-18 VITALS — BP 112/64 | HR 88 | Temp 97.6°F | Ht 64.0 in | Wt 177.6 lb

## 2021-11-18 DIAGNOSIS — N9489 Other specified conditions associated with female genital organs and menstrual cycle: Secondary | ICD-10-CM | POA: Diagnosis not present

## 2021-11-18 DIAGNOSIS — Z975 Presence of (intrauterine) contraceptive device: Secondary | ICD-10-CM | POA: Diagnosis not present

## 2021-11-18 DIAGNOSIS — L858 Other specified epidermal thickening: Secondary | ICD-10-CM

## 2021-11-18 NOTE — Patient Instructions (Signed)
Use ibuprofen 800 mg bid for cramping ?Use a urea-containing lotion (such as Eucerin) on areas of keratosis. ?

## 2021-11-18 NOTE — Progress Notes (Signed)
?[facility] ?[facility] ?[address] ? [place] [address] ?Dept: [phone] ?Dept Fax: [phone] ? ?Office Visit ? ?Subjective:  ? ? Patient ID: Olivia Hayes, female    DOB: 1996-02-19, 26 y.o..   MRN: [id number] ? ?Chief Complaint  ?Patient presents with  ? Acute Visit  ?  C/o having severe cramps in lower back/abdomin, discharge with blood in it.  Had IUD placed 1 year ago. Unable to get in with OB-GYN till September.   ? ? ?History of Present Illness: ? ?Patient is in today complaining of a 48-month history of uterine cramping with some scant bleeding and a vagina discharge. She has a Paraguard IUD that was placed after the birth of her child a year ago. She is aware that some cramping and bleeding can occur. She was more concerned about the discharge. She notes her GYN would not be able to see her until Sept. The cramps have been more severe at times and she did take 800 mg of ibuprofen at home. Ms Victoria asks about trichomonas infection, as she had this int he past. She notes this caused some strife with her husband, due to this being an STI. ? ?[name] also has a history of keratosis pilaris and some areas of hyperpigmentation on her skin. She is planning to use a topical agent she found on line for reducing the hyperpigmentation on her knees. She wonders about other options for managing her KP. ? ?Past Medical History: ?Patient Active Problem List  ? Diagnosis Date Noted  ? Keratosis pilaris 11/18/2021  ? Stress fx pelvis 09/25/2021  ? Obesity (BMI 30.0-34.9) 08/26/2021  ? Aphthous ulcer of mouth 03/17/2021  ? Drug reaction 03/17/2021  ? History of ecchymosis 03/17/2021  ? Trichomoniasis 12/28/2020  ? Intrauterine device 12/22/2020  ? Seasonal allergic rhinitis 09/12/2020  ? Rectal bleeding 06/12/2020  ? History of DVT (deep vein thrombosis) 08/24/2019  ? Herpes simplex 10/15/2018  ? Inattention 01/06/2018  ? Globus sensation 07/11/2015  ? Acne  03/28/2013  ? Multiple allergies 02/21/2013  ? ?Past Surgical History:  ?Procedure Laterality Date  ? ABDOMINAL HERNIA REPAIR  age 28  ? INDUCED ABORTION    ? ?Family History  ?Problem Relation Age of Onset  ? Thyroid disease Mother   ? Colon cancer Neg Hx   ? Pancreatic cancer Neg Hx   ? Esophageal cancer Neg Hx   ? Stomach cancer Neg Hx   ? Rectal cancer Neg Hx   ? Liver cancer Neg Hx   ? ?Outpatient Medications Prior to Visit  ?Medication Sig Dispense Refill  ? Apple Cider Vinegar 300 MG TABS Take by mouth.    ? Ascorbic Acid (VITAMIN C) 1000 MG tablet Take 1,000 mg by mouth daily.    ? COLLAGEN PO Take by mouth.    ? magnesium 30 MG tablet Take 30 mg by mouth 2 (two) times daily.    ? Multiple Vitamin (MULTIVITAMIN) tablet Take 1 tablet by mouth daily.    ? paragard intrauterine copper IUD IUD 1 each by Intrauterine route once.    ? vitamin B-12 (CYANOCOBALAMIN) 100 MCG tablet Take 100 mcg by mouth daily.    ? mupirocin cream (BACTROBAN) 2 % Apply 1 application. topically 2 (two) times daily. (Patient not taking: Reported on 11/18/2021) 15 g 0  ? HYDROcodone bit-homatropine (HYCODAN) 5-1.5 MG/5ML syrup Take 5 mLs by mouth every 8 (eight) hours as needed for cough. 120 mL 0  ? ?No facility-administered medications  prior to visit.  ? ?Allergies  ?Allergen Reactions  ? Peanuts [Peanut Oil] Anaphylaxis and Hives  ? Naproxen Hives  ? ?Objective:  ? ?Today's Vitals  ? 11/18/21 1332  ?BP: 112/64  ?Pulse: 88  ?Temp: 97.6 ?F (36.4 ?C)  ?TempSrc: Temporal  ?SpO2: 97%  ?Weight: 177 lb 10.1 oz (80.6 kg)  ?Height: 5\' 4"  (1.626 m)  ? ?Body mass index is 30.49 kg/m?.  ? ?General: Well developed, well nourished. No acute distress. ?GU: Normal external genitalia. Vaginal vault is pink with a moderate, thick, slightly greenish discharge. Cervix is pink and appears normal.  ? IUD strings are present. ? Skin: Warm and dry. There are areas of slightly red papular rash across the outer aspect of both upper arms. ?Psych: Alert and  oriented. Normal mood and affect. ? ?There are no preventive care reminders to display for this patient. ?   ?Assessment & Plan:  ? ?1. Uterine cramping ?2. Intrauterine device ?We discussed that cramping and slight bleeding can be typical side effects or IUDs, particularly with the Paraguard (non-hormonal). I recommend She use her ibuprofen for the cramping currently. I will await the results of vaginal wet prep and GC/chlamydia culture.  ? ?- Cervicovaginal ancillary only ? ?3. Keratosis pilaris ?I recommend she use a urea-containg lotion (such as Eucerin) to reduce the keratosis. She asks abotu other potential therapies. I will refer her to dermatology to discuss. ? ?- Ambulatory referral to Dermatology ? ?Return if symptoms worsen or fail to improve.  ? ? , MD ?

## 2021-11-23 LAB — CERVICOVAGINAL ANCILLARY ONLY
Bacterial Vaginitis (gardnerella): NEGATIVE
Candida Glabrata: NEGATIVE
Candida Vaginitis: NEGATIVE
Chlamydia: NEGATIVE
Comment: NEGATIVE
Comment: NEGATIVE
Comment: NEGATIVE
Comment: NEGATIVE
Comment: NEGATIVE
Comment: NORMAL
Neisseria Gonorrhea: NEGATIVE
Trichomonas: NEGATIVE

## 2021-12-07 ENCOUNTER — Encounter: Payer: Self-pay | Admitting: Family Medicine

## 2021-12-07 ENCOUNTER — Ambulatory Visit (INDEPENDENT_AMBULATORY_CARE_PROVIDER_SITE_OTHER): Payer: BC Managed Care – PPO | Admitting: Family Medicine

## 2021-12-07 VITALS — BP 118/78 | HR 75 | Temp 97.0°F | Wt 174.0 lb

## 2021-12-07 DIAGNOSIS — J069 Acute upper respiratory infection, unspecified: Secondary | ICD-10-CM | POA: Diagnosis not present

## 2021-12-07 LAB — POCT RAPID STREP A (OFFICE): Rapid Strep A Screen: NEGATIVE

## 2021-12-07 LAB — POC COVID19 BINAXNOW: SARS Coronavirus 2 Ag: NEGATIVE

## 2021-12-07 NOTE — Progress Notes (Signed)
[facility] [address] [place] [address] Dept: [phone] Dept Fax: [phone]  Office Visit  Subjective:    Patient ID: Olivia Hayes, female    DOB: 01/15/1996, 25 y.o..   MRN: [id number]  Chief Complaint  Patient presents with   Sore Throat    Sore throat, body aches, congestion and right ear pain since yesterday.     History of Present Illness:  Patient is in today for complaining of a one day history of body aches, diaphoresis/chills, sore throat and left ear pain. She admits to some tenderness in the right neck. She has had mild nasal congestion and mild cough. She is taking ibuprofen and OTC cold meds. Her toddler has been sick for 2 days. She took her to UC yesterday, where she was diagnosed with a common cold and conjunctivitis.  Past Medical History: Patient Active Problem List   Diagnosis Date Noted   Keratosis pilaris 11/18/2021   Stress fx pelvis 09/25/2021   Obesity (BMI 30.0-34.9) 08/26/2021   Aphthous ulcer of mouth 03/17/2021   Drug reaction 03/17/2021   History of ecchymosis 03/17/2021   Trichomoniasis 12/28/2020   Intrauterine device 12/22/2020   Seasonal allergic rhinitis 09/12/2020   Rectal bleeding 06/12/2020   History of DVT (deep vein thrombosis) 08/24/2019   Herpes simplex 10/15/2018   Inattention 01/06/2018   Globus sensation 07/11/2015   Acne 03/28/2013   Multiple allergies 02/21/2013   Past Surgical History:  Procedure Laterality Date   ABDOMINAL HERNIA REPAIR  age 37   INDUCED ABORTION     Family History  Problem Relation Age of Onset   Thyroid disease Mother    Colon cancer Neg Hx    Pancreatic cancer Neg Hx    Esophageal cancer Neg Hx    Stomach cancer Neg Hx    Rectal cancer Neg Hx    Liver cancer Neg Hx    Outpatient Medications Prior to Visit  Medication Sig Dispense Refill   Apple Cider Vinegar 300 MG TABS Take by mouth.     Ascorbic Acid (VITAMIN C) 1000 MG  tablet Take 1,000 mg by mouth daily.     COLLAGEN PO Take by mouth.     magnesium 30 MG tablet Take 30 mg by mouth 2 (two) times daily.     Multiple Vitamin (MULTIVITAMIN) tablet Take 1 tablet by mouth daily.     paragard intrauterine copper IUD IUD 1 each by Intrauterine route once.     vitamin B-12 (CYANOCOBALAMIN) 100 MCG tablet Take 100 mcg by mouth daily.     mupirocin cream (BACTROBAN) 2 % Apply 1 application. topically 2 (two) times daily. (Patient not taking: Reported on 11/18/2021) 15 g 0   No facility-administered medications prior to visit.   Allergies  Allergen Reactions   Peanuts [Peanut Oil] Anaphylaxis and Hives   Naproxen Hives    Objective:   Today's Vitals   12/07/21 1439  BP: 118/78  Pulse: 75  Temp: (!) 97 F (36.1 C)  TempSrc: Temporal  SpO2: 97%  Weight: 174 lb (78.9 kg)   Body mass index is 29.87 kg/m.   General: Well developed, well nourished. No acute distress. HEENT: Normocephalic, non-traumatic. Conjunctiva clear. External ears normal. EAC and TMs normal   bilaterally. Nose with mild congestion. Mucous membranes moist. Right tonsil with multiple small white   exudates. Oropharynx clear. Good dentition. Neck: Supple. Mildly tender in the right neck. No lymphadenopathy. No thyromegaly. Lungs: Clear to auscultation bilaterally.  No wheezing, rales or rhonchi. CV: RRR without murmurs or rubs. Pulses 2+ bilaterally. Psych: Alert and oriented. Normal mood and affect.  Health Maintenance Due  Topic Date Due   COVID-19 Vaccine (1) Never done   Lab Results POCT Rapid Strep- Neg. POCT COVID- Neg.    Assessment & Plan:   1. Viral upper respiratory tract infection Discussed home care for viral illness, including rest, pushing fluids, and OTC medications as needed for symptom relief. Follow-up if needed for worsening or persistent symptoms.  - POCT rapid strep A - POC COVID-19   Return if symptoms worsen or fail to improve.   Haydee Salter, MD

## 2021-12-07 NOTE — Patient Instructions (Signed)

## 2021-12-08 ENCOUNTER — Ambulatory Visit
Admission: EM | Admit: 2021-12-08 | Discharge: 2021-12-08 | Disposition: A | Discharge status: Home / Self Care | Payer: BC Managed Care – PPO | Attending: Emergency Medicine | Admitting: Emergency Medicine

## 2021-12-08 DIAGNOSIS — J029 Acute pharyngitis, unspecified: Secondary | ICD-10-CM | POA: Diagnosis not present

## 2021-12-08 MED ORDER — LIDOCAINE VISCOUS HCL 2 % MT SOLN: 15.0000 mL | OROMUCOSAL | 0 refills | Status: DC | PRN

## 2021-12-08 MED ORDER — AMOXICILLIN 500 MG PO CAPS: 500.0000 mg | ORAL_CAPSULE | Freq: Two times a day (BID) | ORAL | 0 refills | Status: DC

## 2021-12-08 NOTE — ED Provider Notes (Signed)
[facility]    CSN: [id number] Arrival date & time: 12/08/21  1753      History   Chief Complaint Chief Complaint  Patient presents with   Sore Throat   Otalgia    HPI Olivia Hayes is a 26 y.o. female.  Patient has been having a sore throat with body aches, chills, right-sided ear pain since 12/06/2021.  Was seen by her PCP yesterday tested for strep and COVID and both came back negative.  Today her pain is worse and it hurts to swallow so much she has not been swallowing her saliva.  Did not take her temperature at home but does feel alternately hot and cold.  Took 800 mg of ibuprofen this morning and then took Tylenol an hour later for no relief of symptoms.  Reports her daughter has recently been sick with a URI but that her daughter appears to be getting better but patient feels like she is getting worse.   Sore Throat  Otalgia Associated symptoms: sore throat    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    Pelvic floor dysfunction    Yeast infection     Patient Active Problem List   Diagnosis Date Noted   Keratosis pilaris 11/18/2021   Stress fx pelvis 09/25/2021   Obesity (BMI 30.0-34.9) 08/26/2021   Aphthous ulcer of mouth 03/17/2021   Drug reaction 03/17/2021   History of ecchymosis 03/17/2021   Trichomoniasis 12/28/2020   Intrauterine device 12/22/2020   Seasonal allergic rhinitis 09/12/2020   Rectal bleeding 06/12/2020   History of DVT (deep vein thrombosis) 08/24/2019   Herpes simplex 10/15/2018   Inattention 01/06/2018   Globus sensation 07/11/2015   Acne 03/28/2013   Multiple allergies 02/21/2013    Past Surgical History:  Procedure Laterality Date   ABDOMINAL HERNIA REPAIR  age 89   INDUCED ABORTION      OB History     Gravida  3   Para   1   Term  1   Preterm  0   AB  2   Living  1      SAB  0   IAB  2   Ectopic  0   Multiple  0   Live Births  1            Home Medications    Prior to Admission medications   Medication Sig Start Date End Date Taking? Authorizing Provider  amoxicillin (AMOXIL) 500 MG capsule Take 1 capsule (500 mg total) by mouth 2 (two) times daily. 12/08/21  Yes Cathlyn ParsonsKabbe, Zylie Mumaw M, NP  lidocaine (XYLOCAINE) 2 % solution Use as directed 15 mLs in the mouth or throat as needed for mouth pain. 12/08/21  Yes Cathlyn ParsonsKabbe, Daylen Hack M, NP  Apple Cider Vinegar 300 MG TABS Take by mouth.    [provider]  Ascorbic Acid (VITAMIN C) 1000 MG tablet Take 1,000 mg by mouth daily.    [provider]  COLLAGEN PO Take by mouth.    [provider]  magnesium 30 MG tablet Take 30 mg by mouth 2 (two) times daily.    [provider]  Multiple Vitamin (MULTIVITAMIN) tablet Take 1 tablet by mouth daily.  [provider]  mupirocin cream (BACTROBAN) 2 % Apply 1 application. topically 2 (two) times daily. Patient not taking: Reported on 11/18/2021 10/13/21   Loyola Mast, MD  paragard intrauterine copper IUD IUD 1 each by Intrauterine route once. 11/02/20   [provider]  vitamin B-12 (CYANOCOBALAMIN) 100 MCG tablet Take 100 mcg by mouth daily.    [provider]    Family History Family History  Problem Relation Age of Onset   Thyroid disease Mother    Colon cancer Neg Hx    Pancreatic cancer Neg Hx    Esophageal cancer Neg Hx    Stomach cancer Neg Hx    Rectal cancer Neg Hx    Liver cancer Neg Hx     Social History Social History   Tobacco Use   Smoking status: Never   Smokeless tobacco: Never  Vaping Use   Vaping Use: Never used  Substance Use Topics   Alcohol use: Yes    Comment: Social   Drug use: Not Currently    Types: Marijuana    Comment: last smoked beginning February 2021     Allergies   Peanuts [peanut oil] and  Naproxen   Review of Systems Review of Systems  HENT:  Positive for ear pain and sore throat.     Physical Exam Triage Vital Signs ED Triage Vitals  Enc Vitals Group     BP 12/08/21 1804 130/81     Pulse Rate 12/08/21 1804 (!) 119     Resp 12/08/21 1810 18     Temp 12/08/21 1804 (!) 100.9 F (38.3 C)     Temp Source 12/08/21 1804 Oral     SpO2 12/08/21 1804 96 %     Weight --      Height --      Head Circumference --      Peak Flow --      Pain Score 12/08/21 1810 9     Pain Loc --      Pain Edu? --      Excl. in GC? --    No data found.  Updated Vital Signs BP 130/81 (BP Location: Left Arm)   Pulse (!) 119   Temp (!) 100.9 F (38.3 C) (Oral)   Resp 18   SpO2 96%   Visual Acuity Right Eye Distance:   Left Eye Distance:   Bilateral Distance:    Right Eye Near:   Left Eye Near:    Bilateral Near:     Physical Exam Constitutional:      Appearance: She is well-developed. She is ill-appearing.     Comments: Appears uncomfortable  HENT:     Right Ear: Tympanic membrane and ear canal normal.     Left Ear: Tympanic membrane and ear canal normal.     Mouth/Throat:     Mouth: Mucous membranes are moist.     Pharynx: Oropharyngeal exudate and posterior oropharyngeal erythema present.     Tonsils: 3+ on the right. 3+ on the left.  Cardiovascular:     Rate and Rhythm: Regular rhythm. Tachycardia present.  Pulmonary:     Effort: Pulmonary effort is normal.     Breath sounds: Normal breath sounds.  Musculoskeletal:     Cervical back: Normal range of motion.  Lymphadenopathy:     Head:     Right side of head: Submandibular adenopathy present.     Left side of head: Submandibular adenopathy present.     Cervical: No cervical  adenopathy.  Neurological:     Mental Status: She is alert.     UC Treatments / Results  Labs (all labs ordered are listed, but only abnormal results are displayed) Labs Reviewed - No data to display  EKG   Radiology No results  found.  Procedures Procedures (including critical care time)  Medications Ordered in UC Medications - No data to display  Initial Impression / Assessment and Plan / UC Course  I have reviewed the triage vital signs and the nursing notes.  Pertinent labs & imaging results that were available during my care of the patient were reviewed by me and considered in my medical decision making (see chart for details).    Patient worse since yesterday.  Although rapid strep was negative yesterday, I am concerned that her fever despite taking Tylenol and ibuprofen and her worsening throat pain may mean she has strep.  Prescribed viscous lidocaine for pain.  Patient to continue ibuprofen and Tylenol as needed for pain.  Prescribed amoxicillin.  Final Clinical Impressions(s) / UC Diagnoses   Final diagnoses:  Acute pharyngitis, unspecified etiology     Discharge Instructions      Try gargling with salt water to help relieve pain in your throat. Continue to use tylenol or ibuprofen as directed on the packages for fever and pain.   If you are not getting better, please follow up with your primary care provider.    ED Prescriptions     Medication Sig Dispense Auth. Provider   lidocaine (XYLOCAINE) 2 % solution Use as directed 15 mLs in the mouth or throat as needed for mouth pain. 100 mL Cathlyn Parsons, NP   amoxicillin (AMOXIL) 500 MG capsule Take 1 capsule (500 mg total) by mouth 2 (two) times daily. 20 capsule Cathlyn Parsons, NP      PDMP not reviewed this encounter.   Cathlyn Parsons, NP 12/08/21 [phone]

## 2021-12-08 NOTE — ED Notes (Signed)
Sore throat  started on Sunday with Body aches, went to PCP yesterday and was tested for strep and covid both came back negative. Patient is here today because her tonsil on Right side is swollen and now having ear pain.  Pain 9/10

## 2021-12-08 NOTE — ED Triage Notes (Signed)
Sore throat  started on Sunday with Body aches, went to PCP yesterday and was tested for strep and covid both came back negative. Patient is here today because her tonsil on Right side is swollen and now having ear pain.

## 2021-12-08 NOTE — Discharge Instructions (Signed)
Try gargling with salt water to help relieve pain in your throat. Continue to use tylenol or ibuprofen as directed on the packages for fever and pain.   If you are not getting better, please follow up with your primary care provider.

## 2022-01-20 DIAGNOSIS — M25552 Pain in left hip: Secondary | ICD-10-CM | POA: Diagnosis not present

## 2022-02-02 DIAGNOSIS — M25552 Pain in left hip: Secondary | ICD-10-CM | POA: Diagnosis not present

## 2022-02-12 ENCOUNTER — Encounter: Payer: Self-pay | Admitting: Family Medicine

## 2022-02-12 ENCOUNTER — Ambulatory Visit (INDEPENDENT_AMBULATORY_CARE_PROVIDER_SITE_OTHER): Payer: BC Managed Care – PPO | Admitting: Family Medicine

## 2022-02-12 VITALS — BP 102/70 | HR 89 | Temp 97.7°F | Ht 64.0 in | Wt 174.8 lb

## 2022-02-12 DIAGNOSIS — L509 Urticaria, unspecified: Secondary | ICD-10-CM | POA: Diagnosis not present

## 2022-02-12 MED ORDER — METHYLPREDNISOLONE 4 MG PO TBPK: ORAL_TABLET | ORAL | 0 refills | Status: DC

## 2022-02-12 MED ORDER — DIPHENHYDRAMINE HCL 50 MG PO TABS: 50.0000 mg | ORAL_TABLET | Freq: Three times a day (TID) | ORAL | 0 refills | Status: DC | PRN

## 2022-02-12 NOTE — Patient Instructions (Signed)
Hives Hives (urticaria) are itchy, red, swollen areas on the skin. Hives can appear on any part of the body. Hives often fade within 24 hours (acute hives). Sometimes, new hives appear after old ones fade and the cycle can continue for several days or weeks (chronic hives). Hives do not spread from person to person (are not contagious). Hives come from the body's reaction to something a person is allergic to (allergen), something that causes irritation, or various other triggers. When a person is exposed to a trigger, his or her body releases a chemical (histamine) that causes redness, itching, and swelling. Hives can appear right after exposure to a trigger or hours later. What are the causes? This condition may be caused by: Allergies to foods or ingredients. Insect bites or stings. Exposure to pollen or pets. Spending time in sunlight, heat, or cold (exposure). Exercise. Stress. You can also get hives from other medical conditions and treatments, such as: Viruses, including the common cold. Bacterial infections, such as urinary tract infections and strep throat. Certain medicines. Contact with latex or chemicals. Allergy shots. Blood transfusions. Sometimes, the cause of this condition is not known (idiopathic hives). What increases the risk? You are more likely to develop this condition if you: Are a woman. Have food allergies, especially to citrus fruits, milk, eggs, peanuts, tree nuts, or shellfish. Are allergic to: Medicines. Latex. Insects. Animals. Pollen. What are the signs or symptoms? Common symptoms of this condition include raised, itchy, red or white bumps or patches on your skin. These areas may: Become large and swollen (welts). Change in shape and location, quickly and repeatedly. Be separate hives or connect over a large area of skin. Sting or become painful. Turn white when pressed in the center (blanch). In severe cases, your hands, feet, and face may also  become swollen. This may occur if hives develop deeper in your skin. How is this diagnosed? This condition may be diagnosed by your symptoms, medical history, and physical exam. Your skin, urine, or blood may be tested to find out what is causing your hives and to rule out other health issues. Your health care provider may also remove a small sample of skin from the affected area and examine it under a microscope (biopsy). How is this treated? Treatment for this condition depends on the cause and severity of your symptoms. Your health care provider may recommend using cool, wet cloths (cool compresses) or taking cool showers to relieve itching. Treatment may include: Medicines that help: Relieve itching (antihistamines). Reduce swelling (corticosteroids). Treat infection (antibiotics). An injectable medicine (omalizumab). Your health care provider may prescribe this if you have chronic idiopathic hives and you continue to have symptoms even after treatment with antihistamines. Severe cases may require an emergency injection of adrenaline (epinephrine) to prevent a life-threatening allergic reaction (anaphylaxis). Follow these instructions at home: Medicines Take and apply over-the-counter and prescription medicines only as told by your health care provider. If you were prescribed an antibiotic medicine, take it as told by your health care provider. Do not stop using the antibiotic even if you start to feel better. Skin care Apply cool compresses to the affected areas. Do not scratch or rub your skin. General instructions Do not take hot showers or baths. This can make itching worse. Do not wear tight-fitting clothing. Use sunscreen and wear protective clothing when you are outside. Avoid any substances that cause your hives. Keep a journal to help track what causes your hives. Write down: What medicines you take.   What you eat and drink. What products you use on your skin. Keep all  follow-up visits as told by your health care provider. This is important. Contact a health care provider if: Your symptoms are not controlled with medicine. Your joints are painful or swollen. Get help right away if: You have a fever. You have pain in your abdomen. Your tongue or lips are swollen. Your eyelids are swollen. Your chest or throat feels tight. You have trouble breathing or swallowing. These symptoms may represent a serious problem that is an emergency. Do not wait to see if the symptoms will go away. Get medical help right away. Call your local emergency services (911 in the [place] Do not drive yourself to the hospital. Summary Hives (urticaria) are itchy, red, swollen areas on your skin. Hives come from the body's reaction to something a person is allergic to (allergen), something that causes irritation, or various other triggers. Treatment for this condition depends on the cause and severity of your symptoms. Avoid any substances that cause your hives. Keep a journal to help track what causes your hives. Take and apply over-the-counter and prescription medicines only as told by your health care provider. Get help right away if your chest or throat feels tight or if you have trouble breathing or swallowing. This information is not intended to replace advice given to you by your health care provider. Make sure you discuss any questions you have with your health care provider. Document Revised: 11/04/2021 Document Reviewed: 08/10/2020 Elsevier Patient Education  2023 [employer] Inc[employer]  

## 2022-02-12 NOTE — Progress Notes (Signed)
[facility] [address] [place] [address] Dept: [phone] Dept Fax: [phone]  Office Visit  Subjective:    Patient ID: Olivia Hayes, female    DOB: September 27, 1995, 26 y.o..   MRN: [id number]  Chief Complaint  Patient presents with   Acute Visit    C/o of getting bit by a strange insect 2 days ago & she woke up in hives all over body since 02/11/22.  No other concerns     History of Present Illness:  Patient is in today for evaluation of hives. Olivia Hayes notes that she broke out with hives 2 days ago. These are pretty much all over her body. The rash has been pruritic. She has had this in the past. She notes she had some dermatographism. She had seen an allergist for this at one point. She ahs not taken any medication for this.  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   Keratosis pilaris 11/18/2021   Stress fx pelvis 09/25/2021   Obesity (BMI 30.0-34.9) 08/26/2021   Aphthous ulcer of mouth 03/17/2021   Drug reaction 03/17/2021   History of ecchymosis 03/17/2021   Trichomoniasis 12/28/2020   Intrauterine device 12/22/2020   Seasonal allergic rhinitis 09/12/2020   Rectal bleeding 06/12/2020   History of DVT (deep vein thrombosis) 08/24/2019   Herpes simplex 10/15/2018   Inattention 01/06/2018   Globus sensation 07/11/2015   Acne 03/28/2013   Multiple allergies 02/21/2013   Past Surgical History:  Procedure Laterality Date   ABDOMINAL HERNIA REPAIR  age 27   INDUCED ABORTION     Family History  Problem Relation Age of Onset   Thyroid disease Mother    Colon cancer Neg Hx    Pancreatic cancer Neg Hx    Esophageal cancer Neg Hx    Stomach cancer Neg Hx    Rectal cancer Neg Hx    Liver cancer Neg Hx    Outpatient Medications Prior to Visit  Medication Sig Dispense Refill   amoxicillin (AMOXIL) 500 MG capsule Take 1 capsule (500 mg total) by  mouth 2 (two) times daily. (Patient not taking: Reported on 02/12/2022) 20 capsule 0   Apple Cider Vinegar 300 MG TABS Take by mouth. (Patient not taking: Reported on 02/12/2022)     Ascorbic Acid (VITAMIN C) 1000 MG tablet Take 1,000 mg by mouth daily. (Patient not taking: Reported on 02/12/2022)     COLLAGEN PO Take by mouth. (Patient not taking: Reported on 02/12/2022)     lidocaine (XYLOCAINE) 2 % solution Use as directed 15 mLs in the mouth or throat as needed for mouth pain. (Patient not taking: Reported on 02/12/2022) 100 mL 0   magnesium 30 MG tablet Take 30 mg by mouth 2 (two) times daily. (Patient not taking: Reported on 02/12/2022)     Multiple Vitamin (MULTIVITAMIN) tablet Take 1 tablet by mouth daily. (Patient not taking: Reported on 02/12/2022)     mupirocin cream (BACTROBAN) 2 % Apply 1 application. topically 2 (two) times daily. (Patient not taking: Reported on 11/18/2021) 15 g 0   paragard intrauterine copper IUD IUD 1 each by Intrauterine route once. (Patient not taking: Reported on 02/12/2022)     vitamin B-12 (CYANOCOBALAMIN) 100 MCG tablet Take 100 mcg by mouth daily. (Patient not taking: Reported on 02/12/2022)     No facility-administered medications prior to visit.   Allergies  Allergen Reactions   Peanuts [Peanut Oil] Anaphylaxis  and Hives   Naproxen Hives     Objective:   Today's Vitals   02/12/22 1551  BP: 102/70  Pulse: 89  Temp: 97.7 F (36.5 C)  TempSrc: Temporal  SpO2: 98%  Weight: 174 lb 12.8 oz (79.3 kg)  Height: 5\' 4"  (1.626 m)   Body mass index is 30 kg/m.   General: Well developed, well nourished. No acute distress. Skin: There is a diffuse redness tot he skin of the extremities and trunk with some slight urticarial   appearance. Patient has a photograph that shows coalescing urticarial lesions. Psych: Alert and oriented. Normal mood and affect.  Health Maintenance Due  Topic Date Due   INFLUENZA VACCINE  02/02/2022     Assessment & Plan:   1.  Urticaria The exam today and the patient's photograph are consistent with acute hives. This may have been due to a change in detergents with her now living with her father. I will treat her with antihistamines and a course of steroids.  - methylPREDNISolone (MEDROL DOSEPAK) 4 MG TBPK tablet; Take per package instructions.  Dispense: 21 tablet; Refill: 0 - diphenhydrAMINE (BENADRYL) 50 MG tablet; Take 1 tablet (50 mg total) by mouth every 8 (eight) hours as needed for itching.  Dispense: 30 tablet; Refill: 0  Return if symptoms worsen or fail to improve.   04/04/2022, MD

## 2022-02-13 ENCOUNTER — Encounter: Payer: Self-pay | Admitting: Family Medicine

## 2022-02-13 DIAGNOSIS — L509 Urticaria, unspecified: Secondary | ICD-10-CM

## 2022-02-13 DIAGNOSIS — Z8759 Personal history of other complications of pregnancy, childbirth and the puerperium: Secondary | ICD-10-CM

## 2022-02-16 ENCOUNTER — Telehealth: Payer: Self-pay | Admitting: Family Medicine

## 2022-02-16 NOTE — Telephone Encounter (Signed)
Caller Name: Olivia Hayes Call back phone #: [phone]  Reason for Call: Pt was in August 11th for hives on her body, she says they are not getting better and would like a referral if possible to an allergy specialist.

## 2022-02-17 NOTE — Telephone Encounter (Signed)
Advised patient that referral was placed by PCP on 02/16/2022 and once it's processed that office will contact her for scheduling. She verbalized understanding.

## 2022-02-18 IMAGING — US US MFM OB FOLLOW-UP
1 series · 14 of 28 positions shown · non-contrast
Comparison: none

[Series 1: us mfm ob follow-up · 14 of 30 slices shown]
[im 2/30]
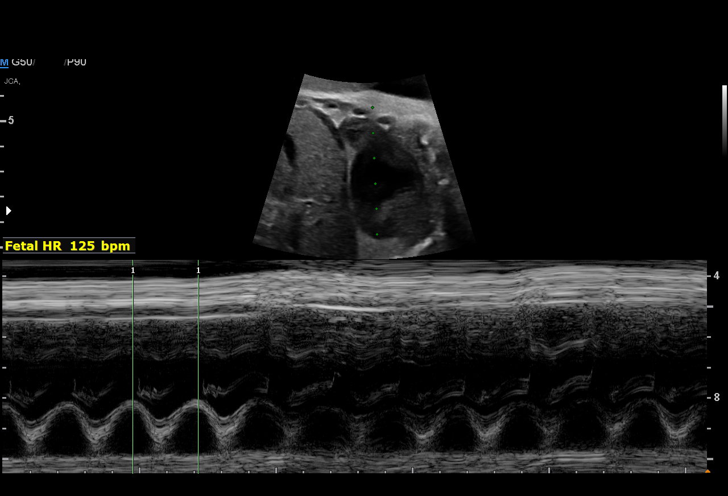
[im 4/30]
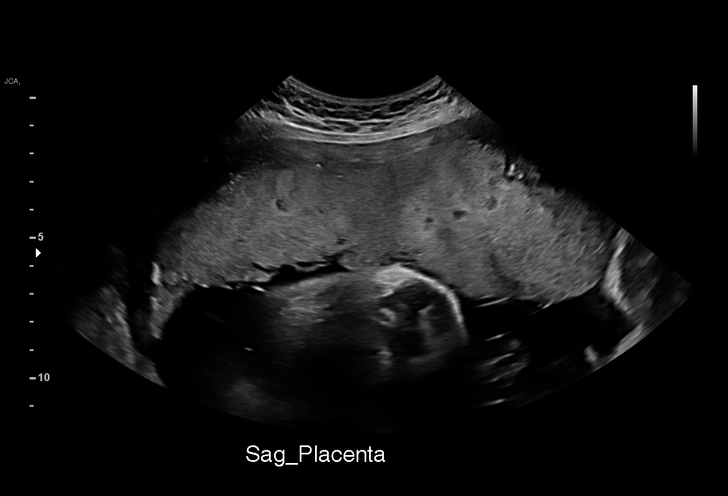
[im 6/30]
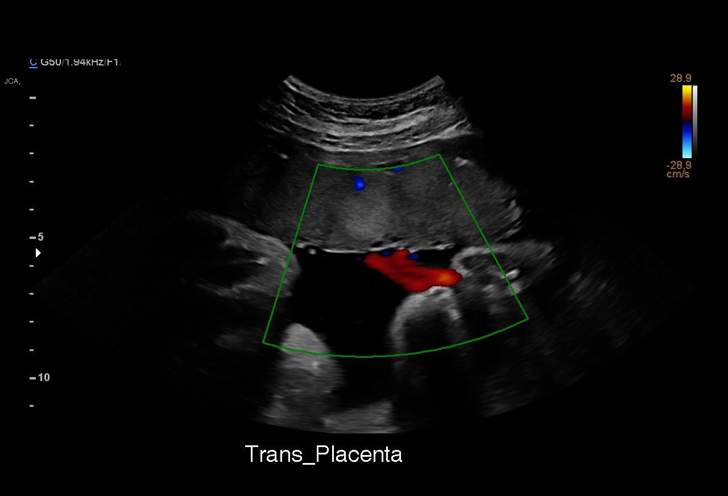
[im 8/30]
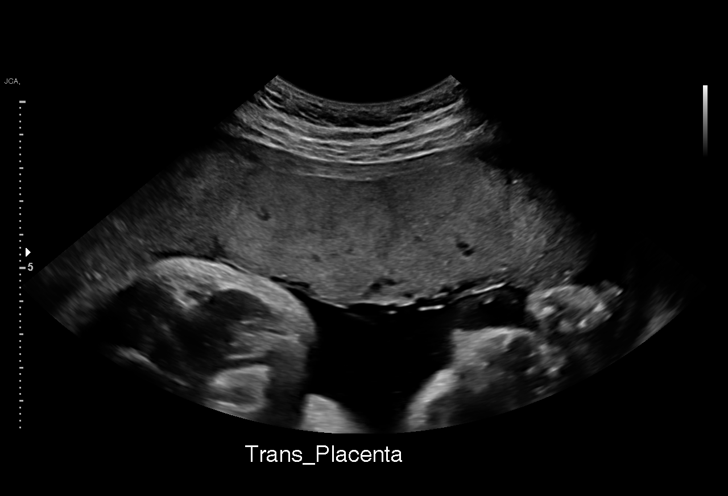
[im 10/30]
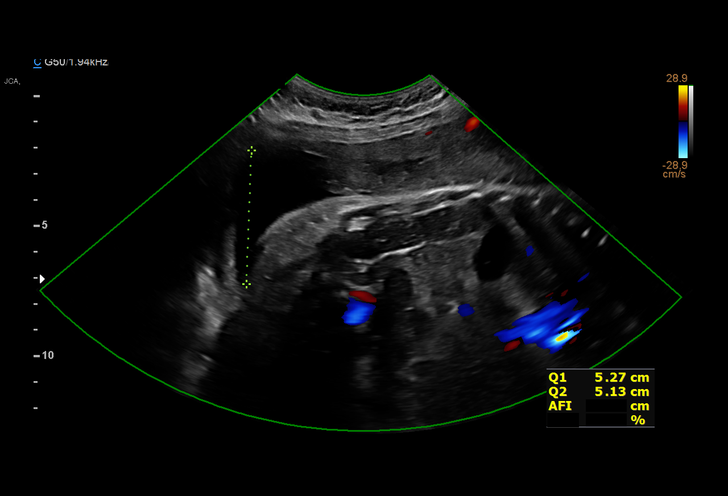
[im 12/30]
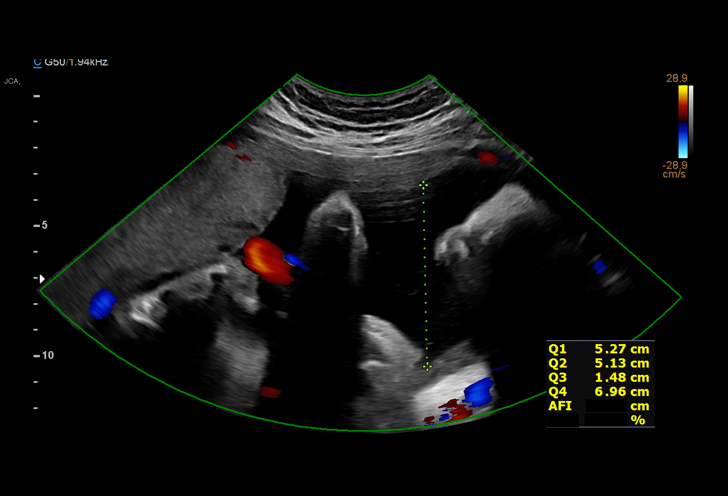
[im 14/30]
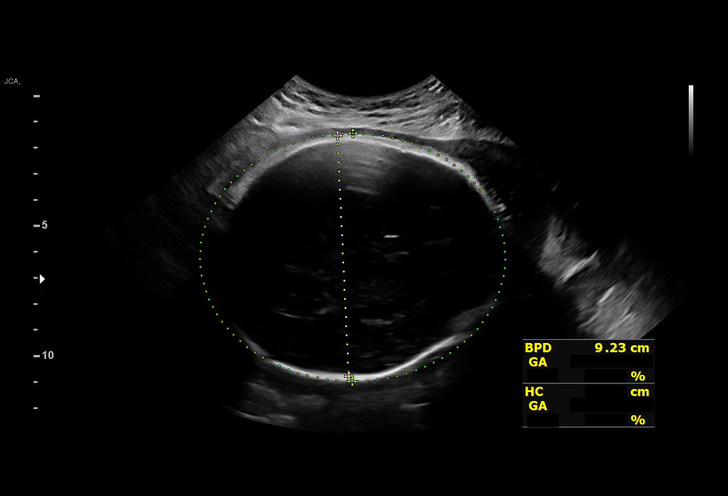
[im 17/30]
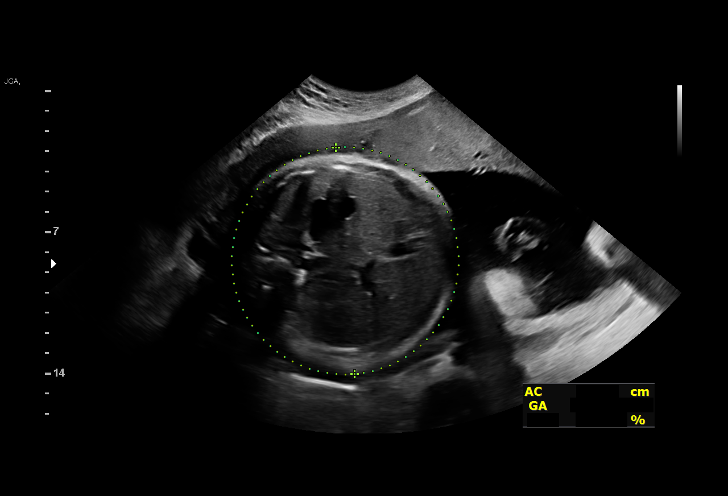
[im 19/30]
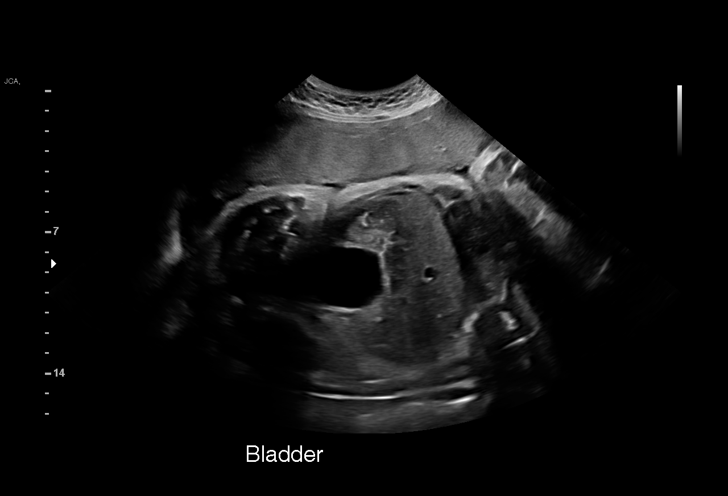
[im 21/30]
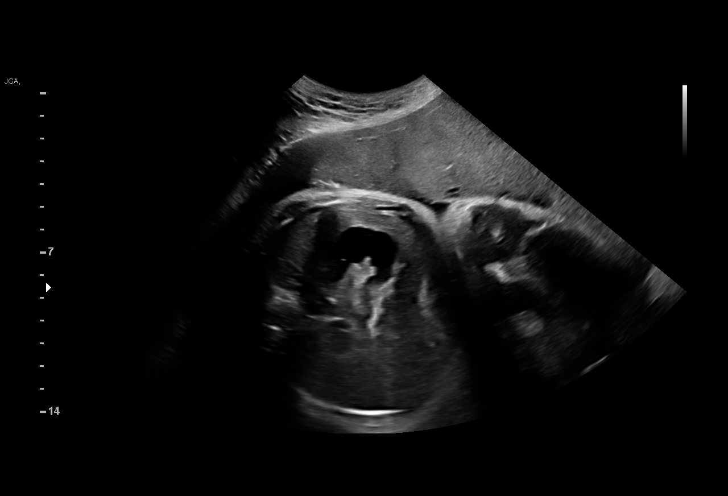
[im 23/30]
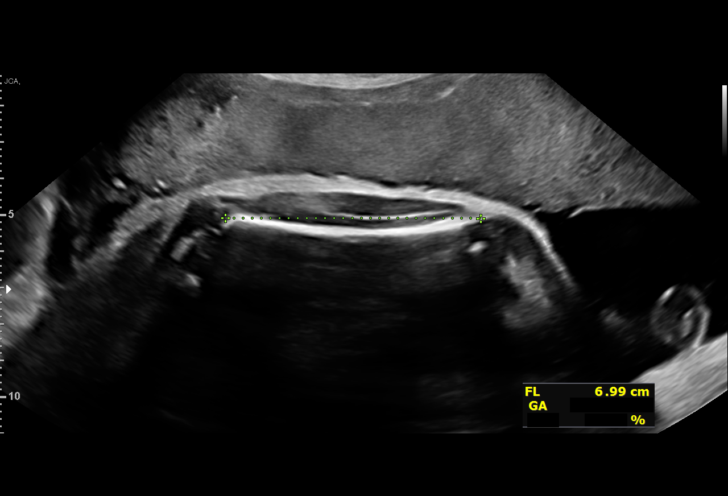
[im 25/30]
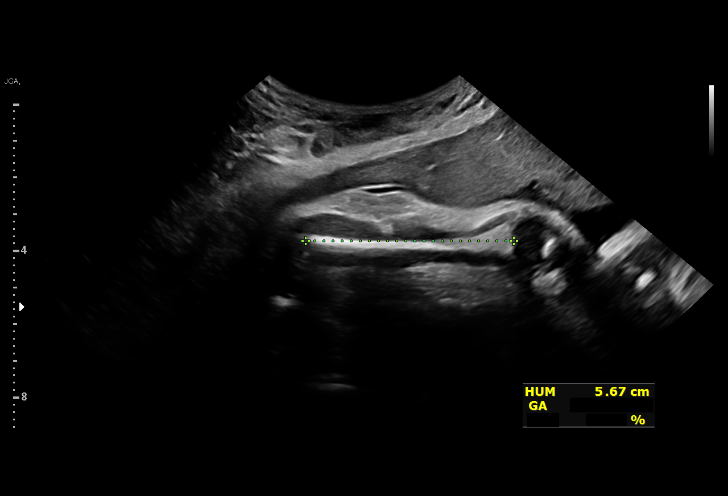
[im 27/30]
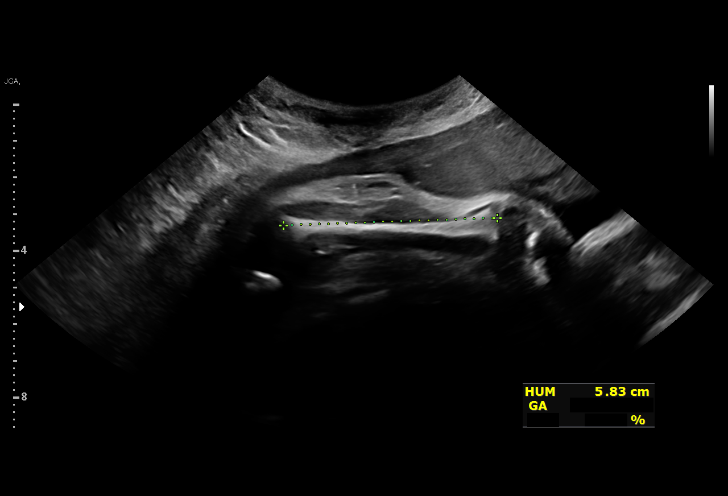
[im 30/30]
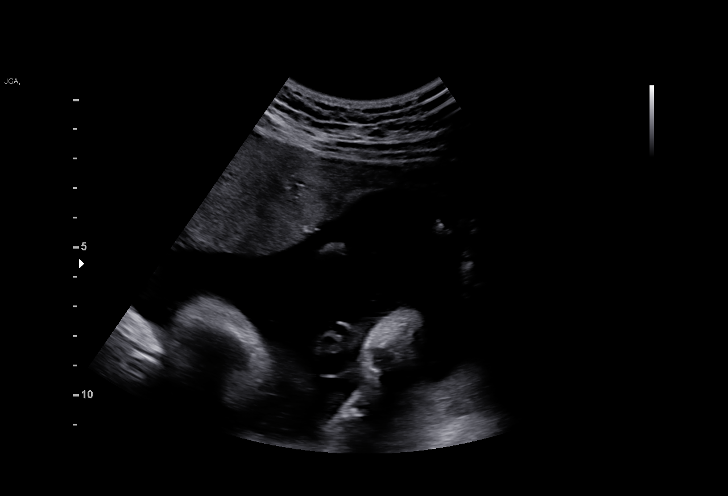

[14 of 28 positions shown; findings below may reference images not displayed]

Indications

 Hx of Deep vein thrombosis (DVT) (lovenox)
 Genetic screening: Negative
 Encounter for other antenatal screening
 follow-up
 36 weeks gestation of pregnancy
Fetal Evaluation

 Num Of Fetuses:         1
 Fetal Heart Rate(bpm):  125
 Cardiac Activity:       Observed
 Presentation:           Cephalic
 Placenta:               Anterior
 P. Cord Insertion:      Visualized

 Amniotic Fluid
 AFI FV:      Within normal limits

 AFI Sum(cm)     %Tile       Largest Pocket(cm)
 18.84           71

 RUQ(cm)       RLQ(cm)       LUQ(cm)        LLQ(cm)

Biometry
 BPD:      93.6  mm     G. Age:  38w 1d         93  %    CI:           77   %    70 - 86
                                                         FL/HC:      20.6   %    20.1 -
 HC:      337.8  mm     G. Age:  38w 5d         77  %    HC/AC:      0.96        0.93 -
 AC:      352.1  mm     G. Age:  39w 1d         99  %    FL/BPD:     74.4   %    71 - 87
 FL:       69.6  mm     G. Age:  35w 5d         28  %    FL/AC:      19.8   %    20 - 24
 HUM:      57.3  mm     G. Age:  33w 2d          9  %

 Est. FW:    7171  gm      7 lb 9 oz     92  %
OB History

 Gravidity:    2
Gestational Age

 LMP:           36w 3d        Date:  07/20/19                 EDD:   04/25/20
 U/S Today:     38w 0d                                        EDD:   04/14/20
 Best:          36w 3d     Det. By:  LMP  (07/20/19)          EDD:   04/25/20
Anatomy

 Cranium:               Appears normal         Aortic Arch:            Previously seen
 Cavum:                 Appears normal         Ductal Arch:            Previously seen
 Ventricles:            Appears normal         Diaphragm:              Appears normal
 Choroid Plexus:        Previously seen        Stomach:                Appears normal, left
                                                                       sided
 Cerebellum:            Previously seen        Abdomen:                Appears normal
 Posterior Fossa:       Previously seen        Abdominal Wall:         Previously seen
 Nuchal Fold:           Not applicable (>20    Cord Vessels:           Previously seen
                        wks GA)
 Face:                  Orbits and profile     Kidneys:                Appear normal
                        previously seen
 Lips:                  Previously seen        Bladder:                Appears normal
 Thoracic:              Appears normal         Spine:                  Previously seen
 Heart:                 Previously seen        Upper Extremities:      Previously seen
 RVOT:                  Previously seen        Lower Extremities:      Previously seen
 LVOT:                  Previously seen

 Other:  Fetus appears to be female. Hands and feet prev visualized. Heels
         not well  visualized.
Comments

 This patient was seen for a follow up growth scan due to a
 history of a prior DVT currently treated with prophylactic
 Lovenox 80 mg daily.  She denies any problems since her
 last exam.
 She was informed that the fetal growth and amniotic fluid
 level appears appropriate for her gestational age.
 Due to her history of a prior DVT, she should continue to be
 treated with anticoagulation up until the day prior to her
 delivery and for 6 weeks postpartum.
 As the fetal growth is within normal limits, no further exams
 were scheduled in our office.

## 2022-03-01 ENCOUNTER — Ambulatory Visit: Payer: BC Managed Care – PPO | Admitting: Behavioral Health

## 2022-03-02 NOTE — Progress Notes (Deleted)
   Acute Office Visit  Subjective:     Patient ID: Olivia Hayes, female    DOB: 01-30-1996, 26 y.o.   MRN: [id number]  No chief complaint on file.   HPI Patient is in today for UTI symptoms.   ROS      Objective:    There were no vitals taken for this visit. {Vitals History (Optional):23777}  Physical Exam  No results found for any visits on 03/03/22.      Assessment & Plan:   Problem List Items Addressed This Visit   None   No orders of the defined types were placed in this encounter.   No follow-ups on file.  Gerre Scull, NP

## 2022-03-03 ENCOUNTER — Encounter: Payer: Self-pay | Admitting: Nurse Practitioner

## 2022-03-03 ENCOUNTER — Ambulatory Visit: Payer: BC Managed Care – PPO | Admitting: Nurse Practitioner

## 2022-03-03 ENCOUNTER — Telehealth: Payer: Self-pay | Admitting: Nurse Practitioner

## 2022-03-03 NOTE — Telephone Encounter (Signed)
8.30.23 no show letter sent 

## 2022-03-03 NOTE — Progress Notes (Unsigned)
   Acute Office Visit  Subjective:     Patient ID: Olivia Hayes, female    DOB: [birth year], 26 y.o.   MRN: [id number]  No chief complaint on file.   HPI Patient is in today for UTI symptoms.   URINARY SYMPTOMS  Dysuria: {Blank single:19197::"yes","no","burning"} Urinary frequency: {Blank single:19197::"yes","no"} Urgency: {Blank single:19197::"yes","no"} Small volume voids: {Blank single:19197::"yes","no"} Symptom severity: {Blank single:19197::"yes","no"} Urinary incontinence: {Blank single:19197::"yes","no"} Foul odor: {Blank single:19197::"yes","no"} Hematuria: {Blank single:19197::"yes","no"} Abdominal pain: {Blank single:19197::"yes","no"} Back pain: {Blank single:19197::"yes","no"} Suprapubic pain/pressure: {Blank single:19197::"yes","no"} Flank pain: {Blank single:19197::"yes","no"} Fever:  {Blank multiple:19196::"yes","no","subjective","low grade"} Vomiting: {Blank single:19197::"yes","no"} Relief with cranberry juice: {Blank single:19197::"yes","no"} Relief with pyridium: {Blank single:19197::"yes","no"} Status: better/worse/stable Previous urinary tract infection: {Blank single:19197::"yes","no"} Recurrent urinary tract infection: {Blank single:19197::"yes","no"} Sexual activity: No sexually active/monogomous/practicing safe sex History of sexually transmitted disease: {Blank single:19197::"yes","no"} Penile discharge: {Blank single:19197::"yes","no"} Treatments attempted: {Blank multiple:19196::"none","antibiotics","pyridium","cranberry","increasing fluids"}   ROS      Objective:    There were no vitals taken for this visit. {Vitals History (Optional):23777}  Physical Exam  No results found for any visits on 03/04/22.      Assessment & Plan:   Problem List Items Addressed This Visit   None   No orders of the defined types were placed in this encounter.   No follow-ups on file.  Gerre Scull, NP

## 2022-03-03 NOTE — Telephone Encounter (Signed)
Pt called 10:03a to cancel/reschedule for 8/31 - 1st late cancel/no show, fee waived, letter sent via [facility]

## 2022-03-03 NOTE — Telephone Encounter (Signed)
noted 

## 2022-03-04 ENCOUNTER — Ambulatory Visit (INDEPENDENT_AMBULATORY_CARE_PROVIDER_SITE_OTHER): Payer: BC Managed Care – PPO | Admitting: Nurse Practitioner

## 2022-03-04 ENCOUNTER — Encounter: Payer: Self-pay | Admitting: Nurse Practitioner

## 2022-03-04 ENCOUNTER — Other Ambulatory Visit (HOSPITAL_COMMUNITY)
Admission: RE | Admit: 2022-03-04 | Discharge: 2022-03-04 | Disposition: A | Discharge status: Home / Self Care | Payer: BC Managed Care – PPO | Source: Ambulatory Visit | Attending: Nurse Practitioner | Admitting: Nurse Practitioner

## 2022-03-04 VITALS — BP 105/70 | HR 91 | Temp 95.5°F | Wt 172.3 lb

## 2022-03-04 DIAGNOSIS — R300 Dysuria: Secondary | ICD-10-CM | POA: Diagnosis not present

## 2022-03-04 LAB — POCT URINALYSIS DIPSTICK
Bilirubin, UA: NEGATIVE
Blood, UA: NEGATIVE
Glucose, UA: NEGATIVE
Ketones, UA: NEGATIVE
Leukocytes, UA: NEGATIVE
Nitrite, UA: NEGATIVE
Protein, UA: POSITIVE — AB
Spec Grav, UA: 1.025 (ref 1.010–1.025)
Urobilinogen, UA: 0.2 E.U./dL
pH, UA: 6 (ref 5.0–8.0)

## 2022-03-04 NOTE — Patient Instructions (Signed)
It was great to see you!  Drink plenty of water/fluids over the next few days.   We are also adding on a urine culture and test for STD.   Let's follow-up if your symptoms worsen or don't improve.   Take care,  Rodman Pickle, NP

## 2022-03-05 LAB — URINE CYTOLOGY ANCILLARY ONLY
Chlamydia: NEGATIVE
Comment: NEGATIVE
Comment: NORMAL
Neisseria Gonorrhea: NEGATIVE

## 2022-03-06 LAB — URINE CULTURE
MICRO NUMBER:: 13858629
SPECIMEN QUALITY:: ADEQUATE

## 2022-04-08 ENCOUNTER — Encounter: Payer: Self-pay | Admitting: Allergy & Immunology

## 2022-04-08 ENCOUNTER — Ambulatory Visit (INDEPENDENT_AMBULATORY_CARE_PROVIDER_SITE_OTHER): Payer: BC Managed Care – PPO | Admitting: Allergy & Immunology

## 2022-04-08 VITALS — BP 108/70 | HR 79 | Temp 98.4°F | Resp 12 | Ht 62.8 in | Wt 174.0 lb

## 2022-04-08 DIAGNOSIS — J3089 Other allergic rhinitis: Secondary | ICD-10-CM | POA: Diagnosis not present

## 2022-04-08 DIAGNOSIS — L508 Other urticaria: Secondary | ICD-10-CM | POA: Diagnosis not present

## 2022-04-08 DIAGNOSIS — T63481D Toxic effect of venom of other arthropod, accidental (unintentional), subsequent encounter: Secondary | ICD-10-CM

## 2022-04-08 DIAGNOSIS — J302 Other seasonal allergic rhinitis: Secondary | ICD-10-CM

## 2022-04-08 DIAGNOSIS — T7800XD Anaphylactic reaction due to unspecified food, subsequent encounter: Secondary | ICD-10-CM | POA: Diagnosis not present

## 2022-04-08 DIAGNOSIS — R21 Rash and other nonspecific skin eruption: Secondary | ICD-10-CM | POA: Diagnosis not present

## 2022-04-08 DIAGNOSIS — J310 Chronic rhinitis: Secondary | ICD-10-CM

## 2022-04-08 DIAGNOSIS — J45990 Exercise induced bronchospasm: Secondary | ICD-10-CM

## 2022-04-08 MED ORDER — RYALTRIS 665-25 MCG/ACT NA SUSP: NASAL | 5 refills | Status: DC

## 2022-04-08 NOTE — Patient Instructions (Addendum)
1. Exercise induced bronchospasm - Lung testing looked awesome. - I do not think that we need a controller medication at this time. - We can send in an albuterol inhaler in case she needed for more intense cardio.  2. GERD - I would like to start Pepcid 40 mg daily to see if this helps.  - I think this might be why you get shortness of breath when you lay down at night. - We will try this and see how it works.  4. Insect sting allergy - We will get a stinging insect IgE panel as well as a fire ant IgE. - We are going to get a tryptase to rule out mast cell disease as well. - We we will hold off on EpiPen for now until we get the lab results back so you do not have an added expense.  5. Chronic rhinitis - Testing today showed: grasses, ragweed, weeds, trees, indoor molds, outdoor molds, cockroach, and mouse . - Copy of test results provided.  - Avoidance measures provided. - Stop taking:  - Continue with: Zyrtec (cetirizine) 10mg  tablet once daily - Start taking: Ryaltris (olopatadine/mometasone) two sprays per nostril 1-2 times daily as needed - You can use an extra dose of the Zyrtec, if needed, for breakthrough symptoms.  - Consider nasal saline rinses 1-2 times daily to remove allergens from the nasal cavities as well as help with mucous clearance (this is especially helpful to do before the nasal sprays are given) - Consider allergy shots as a means of long-term control and cure.  - Allergy shots "re-train" and "reset" the immune system to ignore environmental allergens and decrease the resulting immune response to those allergens (sneezing, itchy watery eyes, runny nose, nasal congestion, etc).    - Allergy shots improve symptoms in 75-85% of patients.  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 - We are going to get some blood work to confirm this. - Make an  appointment for peanut challenge any way out today. - We we will call you in 1 to 2 weeks with the results of the testing.  7. Return in about 4 weeks (around 05/06/2022) for PEANUT CHALLENGE and then 3 months for a regular FOLLOW UP.    Please inform 13/08/2021 of any Emergency Department visits, hospitalizations, or changes in symptoms. Call us before going to the ED for breathing or allergy symptoms since we might be able to fit you in for a sick visit. Feel free to contact us anytime with any questions, problems, or concerns.  It was a pleasure to meet you today!  Websites that have reliable patient information: 1. American Academy of Asthma, Allergy, and Immunology: www.aaaai.org 2. Food Allergy Research and Education (FARE): foodallergy.org 3. Mothers of Asthmatics: http://www.asthmacommunitynetwork.org 4. American College of Allergy, Asthma, and Immunology: www.acaai.org   COVID-19 Vaccine Information can be found at: [place] For questions related to vaccine distribution or appointments, please email [email or web address] or call [phone].   We realize that you might be concerned about having an allergic reaction to the COVID19 vaccines. To help with that concern, WE ARE OFFERING THE COVID19 VACCINES IN OUR OFFICE! Ask the front desk for dates!     "Like" [phone] on Facebook and Instagram for our latest updates!      A healthy democracy works best when [place] participate! Make sure you are registered  to vote! If you have moved or changed any of your contact information, you will need to get this updated before voting!  In some cases, you MAY be able to register to vote online: [email or web address]         Airborne Adult Perc - 04/08/22 1006     Time Antigen Placed [place]    Location Back    Number of Test 59    1. Control-Buffer 50% Glycerol  Negative    2. Control-Histamine 1 mg/ml 2+    3. Albumin saline Negative    4. [place] 2+    5. [place] 2+    6. [place] 2+    7. [place] Blue 2+    8. Meadow Fescue 2+    9. Perennial Rye 2+    10. Sweet Vernal 2+    11. Timothy 2+    12. Cocklebur Negative    13. Burweed Marshelder Negative    14. Ragweed, short 2+    15. Ragweed, Giant 2+    16. Plantain,  English Negative    17. Lamb's Quarters 2+    18. Sheep Sorrell 2+    19. Rough Pigweed 2+    20. Marsh Elder, Rough Negative    21. Mugwort, Common Negative    22. Ash mix Negative    23. Birch mix Negative    24. Beech American Negative    25. Box, Elder 3+    26. Cedar, red 2+    27. Cottonwood, Eastern 2+    28. Elm mix 2+    29. Hickory Negative    30. Maple mix Negative    31. Oak, [place] mix 4+    32. Pecan Pollen 3+    33. Pine mix 2+    34. Sycamore Eastern Negative    35. Hassell, Black Pollen 3+    36. Alternaria alternata 2+    37. Cladosporium Herbarum 3+    38. Aspergillus mix 2+    39. Penicillium mix 3+    40. Bipolaris sorokiniana (Helminthosporium) 3+    41. Drechslera spicifera (Curvularia) 2+    42. Mucor plumbeus 3+    43. Fusarium moniliforme Negative    44. Aureobasidium pullulans (pullulara) Negative    45. Rhizopus oryzae 3+    46. Botrytis cinera 2+    47. Epicoccum nigrum 2+    48. Phoma betae 2+    49. Candida Albicans 2+    50. Trichophyton mentagrophytes 2+    51. Mite, D Farinae  5,000 AU/ml Negative    52. Mite, D Pteronyssinus  5,000 AU/ml Negative    53. Cat Hair 10,000 BAU/ml Negative    54.  Dog Epithelia Negative    55. Mixed Feathers Negative    56. Horse Epithelia Negative    57. Cockroach, German 2+    58. Mouse 3+    59. Tobacco Leaf Negative             Food Adult Perc - 04/08/22 1000     Time Antigen Placed 1011    Allergen Manufacturer Lavella Hammock    Location Back    Number of allergen test 1    1. Peanut Negative             Reducing Pollen  Exposure  The American Academy of Allergy, Asthma and Immunology suggests the following steps to reduce your exposure to pollen during allergy seasons.    Do not hang sheets or  clothing out to dry; pollen may collect on these items. Do not mow lawns or spend time around freshly cut grass; mowing stirs up pollen. Keep windows closed at night.  Keep car windows closed while driving. Minimize morning activities outdoors, a time when pollen counts are usually at their highest. Stay indoors as much as possible when pollen counts or humidity is high and on windy days when pollen tends to remain in the air longer. Use air conditioning when possible.  Many air conditioners have filters that trap the pollen spores. Use a HEPA room air filter to remove pollen form the indoor air you breathe.   Control of Mold Allergen   Mold and fungi can grow on a variety of surfaces provided certain temperature and moisture conditions exist.  Outdoor molds grow on plants, decaying vegetation and soil.  The major outdoor mold, Alternaria and Cladosporium, are found in very high numbers during hot and dry conditions.  Generally, a late Summer - Fall peak is seen for common outdoor fungal spores.  Rain will temporarily lower outdoor mold spore count, but counts rise rapidly when the rainy period ends.  The most important indoor molds are Aspergillus and Penicillium.  Dark, humid and poorly ventilated basements are ideal sites for mold growth.  The next most common sites of mold growth are the bathroom and the kitchen.  Outdoor (Seasonal) Mold Control  Positive outdoor molds via skin testing: Alternaria, Cladosporium, Bipolaris (Helminthsporium), Drechslera (Curvalaria), Mucor, and Epicoccum  Use air conditioning and keep windows closed Avoid exposure to decaying vegetation. Avoid leaf raking. Avoid grain handling. Consider wearing a face mask if working in moldy areas.    Indoor (Perennial) Mold Control   Positive  indoor molds via skin testing: Aspergillus, Penicillium, Rhizopus, Botrytis, Phoma, Candida, and Trichophyton  Maintain humidity below 50%. Clean washable surfaces with 5% bleach solution. Remove sources e.g. contaminated carpets.   Control of Cockroach Allergen  Cockroach allergen has been identified as an important cause of acute attacks of asthma, especially in urban settings.  There are fifty-five species of cockroach that exist in the [place], however only three, the [place], [place] species produce allergen that can affect patients with Asthma.  Allergens can be obtained from fecal particles, egg casings and secretions from cockroaches.    Remove food sources. Reduce access to water. Seal access and entry points. Spray runways with 0.5-1% Diazinon or Chlorpyrifos Blow boric acid power under stoves and refrigerator. Place bait stations (hydramethylnon) at feeding sites.   Allergy Shots   Allergies are the result of a chain reaction that starts in the immune system. Your immune system controls how your body defends itself. For instance, if you have an allergy to pollen, your immune system identifies pollen as an invader or allergen. Your immune system overreacts by producing antibodies called Immunoglobulin E (IgE). These antibodies travel to cells that release chemicals, causing an allergic reaction.  The concept behind allergy immunotherapy, whether it is received in the form of shots or tablets, is that the immune system can be desensitized to specific allergens that trigger allergy symptoms. Although it requires time and patience, the payback can be long-term relief.  How Do Allergy Shots Work?  Allergy shots work much like a vaccine. Your body responds to injected amounts of a particular allergen given in increasing doses, eventually developing a resistance and tolerance to it. Allergy shots can lead to decreased, minimal or no allergy symptoms.  There  generally are two phases: build-up  and maintenance. Build-up often ranges from three to six months and involves receiving injections with increasing amounts of the allergens. The shots are typically given once or twice a week, though more rapid build-up schedules are sometimes used.  The maintenance phase begins when the most effective dose is reached. This dose is different for each person, depending on how allergic you are and your response to the build-up injections. Once the maintenance dose is reached, there are longer periods between injections, typically two to four weeks.  Occasionally doctors give cortisone-type shots that can temporarily reduce allergy symptoms. These types of shots are different and should not be confused with allergy immunotherapy shots.  Who Can Be Treated with Allergy Shots?  Allergy shots may be a good treatment approach for people with allergic rhinitis (hay fever), allergic asthma, conjunctivitis (eye allergy) or stinging insect allergy.   Before deciding to begin allergy shots, you should consider:   The length of allergy season and the severity of your symptoms  Whether medications and/or changes to your environment can control your symptoms  Your desire to avoid long-term medication use  Time: allergy immunotherapy requires a major time commitment  Cost: may vary depending on your insurance coverage  Allergy shots for children age 72 and older are effective and often well tolerated. They might prevent the onset of new allergen sensitivities or the progression to asthma.  Allergy shots are not started on patients who are pregnant but can be continued on patients who become pregnant while receiving them. In some patients with other medical conditions or who take certain common medications, allergy shots may be of risk. It is important to mention other medications you talk to your allergist.   When Will I Feel Better?  Some may experience decreased allergy  symptoms during the build-up phase. For others, it may take as long as 12 months on the maintenance dose. If there is no improvement after a year of maintenance, your allergist will discuss other treatment options with you.  If you aren't responding to allergy shots, it may be because there is not enough dose of the allergen in your vaccine or there are missing allergens that were not identified during your allergy testing. Other reasons could be that there are high levels of the allergen in your environment or major exposure to non-allergic triggers like tobacco smoke.  What Is the Length of Treatment?  Once the maintenance dose is reached, allergy shots are generally continued for three to five years. The decision to stop should be discussed with your allergist at that time. Some people may experience a permanent reduction of allergy symptoms. Others may relapse and a longer course of allergy shots can be considered.  What Are the Possible Reactions?  The two types of adverse reactions that can occur with allergy shots are local and systemic. Common local reactions include very mild redness and swelling at the injection site, which can happen immediately or several hours after. A systemic reaction, which is less common, affects the entire body or a particular body system. They are usually mild and typically respond quickly to medications. Signs include increased allergy symptoms such as sneezing, a stuffy nose or hives.  Rarely, a serious systemic reaction called anaphylaxis can develop. Symptoms include swelling in the throat, wheezing, a feeling of tightness in the chest, nausea or dizziness. Most serious systemic reactions develop within 30 minutes of allergy shots. This is why it is strongly recommended you wait in your doctor's office for 30  minutes after your injections. Your allergist is trained to watch for reactions, and his or her staff is trained and equipped with the proper medications to  identify and treat them.  Who Should Administer Allergy Shots?  The preferred location for receiving shots is your prescribing allergist's office. Injections can sometimes be given at another facility where the physician and staff are trained to recognize and treat reactions, and have received instructions by your prescribing allergist.

## 2022-04-08 NOTE — Progress Notes (Signed)
NEW PATIENT  Date of Service/Encounter:  04/08/22  Consult requested by: Loyola Mastudd, Stephen M, MD   Assessment:   Exercise induced bronchospasm   Rash - with likely stinging insect allergy  Perennial and seasonal allergic rhinitis  Anaphylactic shock due to food  Plan/Recommendations:   1. Exercise induced bronchospasm - Lung testing looked awesome. - I do not think that we need a controller medication at this time. - We can send in an albuterol inhaler in case she needed for more intense cardio.  2. GERD - I would like to start Pepcid 40 mg daily to see if this helps.  - I think this might be why you get shortness of breath when you lay down at night. - We will try this and see how it works.  4. 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 - We are going to get a tryptase to rule out mast cell disease as well. - We we will hold off on EpiPen for now until we get the lab results back so you do not have an added expense.  5. Chronic rhinitis - Testing today showed: grasses, ragweed, weeds, trees, indoor molds, outdoor molds, cockroach, and mouse . - Copy of test results provided.  - Avoidance measures provided. - Stop taking:  - Continue with: Zyrtec (cetirizine) 10mg  tablet once daily - Start taking: Ryaltris (olopatadine/mometasone) two sprays per nostril 1-2 times daily as needed - You can use an extra dose of the Zyrtec, if needed, for breakthrough symptoms.  - Consider nasal saline rinses 1-2 times daily to remove allergens from the nasal cavities as well as help with mucous clearance (this is especially helpful to do before the nasal sprays are given) - Consider allergy shots as a means of long-term control and cure.  - Allergy shots "re-train" and "reset" the immune system to ignore environmental allergens and decrease the resulting immune response to those allergens (sneezing, itchy watery  eyes, runny nose, nasal congestion, etc).    - Allergy shots improve symptoms in 75-85% of patients.  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 - We are going to get some blood work to confirm this. - Make an appointment for peanut challenge any way out today. - We we will call you in 1 to 2 weeks with the results of the testing.  7. Return in about 4 weeks (around 05/06/2022) for PEANUT CHALLENGE and then 3 months for a regular FOLLOW UP.     This note in its entirety was forwarded to the Provider who requested this consultation.  Subjective:   Olivia Hayes is a 26 y.o. female presenting today for evaluation of  Chief Complaint  Patient presents with   Urticaria    August got stung by something in the pool and broke out in hives and since then hives have been coming and going randomly.   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  Olivia Hayes was referred by Loyola Mast, MD.     Sounds like "ta-da"  Olivia Hayes is a 26 y.o. female presenting for an evaluation of allergies and asthma .  She a two year old at home who loves to play in the water.     Asthma/Respiratory Symptom History: She reports that she has exercise induced asthma in middle school. She felt like there was smoke in her lungs when she was running. She has been back in the gym for a while.  She does not have any nighttime coughing. She does do some cardio but she is not running. This is what makes her symptoms bad. She is not avoiding anything due to her pulmonary issues.   Allergic Rhinitis Symptom History: She does have environmental allergies ot pollen as well as dust. She has been tested in the past. This was an old practice when she was 79 or 26 years of age. She was never on allergy shots for this. She is currently using cetirizine or an OTC medication.  She does react to cats. She grew up with cats. This is mostly a problem when she touches them and she gets hives. She does do fine with dogs. She will sneeze occasionally. She had a nose spray but never used it.  She denies sinus infections. She did have AOM multiple times when she was younger and she saw ENT. She did have to have something removed from her ears occasionally.   Food Allergy Symptom History: She did have anaphylaxis to peanuts, but she eats other nuts without a problem. She was around 26 years of age when this happened. She thinks that she ate peanut butter. She was at a sleep over. She previously tolerated them. She is not reading labels at all.   She has a history of DVT when she was first put on birch control. She now how the copper IUD.   Skin Symptom History: She does have KP. She has tried multiple otpions without improvement.  She has never had eczema or urticaria.   She did have some episide where she wwas at the pool and was stung by something. She had hives for 3 days and this did not pinpoint.  Otherwise, there is no history of other atopic diseases, including asthma, drug allergies, stinging insect allergies, or contact dermatitis. There is no significant infectious history. Vaccinations are up  to date.    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   Keratosis pilaris 11/18/2021   Stress fx pelvis 09/25/2021   Obesity (BMI 30.0-34.9) 08/26/2021   Aphthous ulcer of mouth 03/17/2021   Drug reaction 03/17/2021   History of ecchymosis 03/17/2021   Trichomoniasis 12/28/2020   Intrauterine device 12/22/2020   Seasonal allergic rhinitis 09/12/2020   Rectal bleeding 06/12/2020   History of DVT (deep vein thrombosis) 08/24/2019   Herpes simplex 10/15/2018   Inattention 01/06/2018   Globus sensation 07/11/2015   Acne 03/28/2013   Multiple allergies 02/21/2013    Medication List:  Allergies as of 04/08/2022  Reactions   Peanuts [peanut Oil] Anaphylaxis, Hives   Naproxen Hives        Medication List        Accurate as of April 08, 2022  1:08 PM. If you have any questions, ask your nurse or doctor.          STOP taking these medications    methylPREDNISolone 4 MG Tbpk tablet Commonly known as: MEDROL DOSEPAK Stopped by: Valentina Shaggy, MD       TAKE these medications    diphenhydrAMINE 50 MG tablet Commonly known as: BENADRYL Take 1 tablet (50 mg total) by mouth every 8 (eight) hours as needed for itching.   Ryaltris 409-81 MCG/ACT Susp Generic drug: Olopatadine-Mometasone Do two sprays per nostril 1-2 times daily as needed Started by: Valentina Shaggy, MD        Birth History: non-contributory  Developmental History: non-contributory  Past Surgical History: Past Surgical History:  Procedure Laterality Date   ABDOMINAL HERNIA REPAIR  age 40   INDUCED ABORTION       Family History: Family History  Problem Relation Age of Onset   Asthma Mother    Allergic rhinitis Mother    Thyroid disease Mother    Colon cancer Neg Hx    Pancreatic cancer Neg Hx    Esophageal cancer Neg Hx    Stomach cancer Neg Hx    Rectal cancer Neg Hx    Liver cancer Neg Hx      Social  History: Terianna lives at home with her family. 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  She lives in an apartment.  There is carpeting throughout the apartment.  She has electric heating and central cooling.  There are no animals inside of the home.  There are no dust mite covers on the pillows, but she does have them on the bed.  There is no tobacco exposure.  She does not use a HEPA filter in the home.  She is exposed to fumes, chemicals, and dust.  She does not live near an interstate or industrial area.   Review of Systems  Constitutional: Negative.  Negative for chills, fever, malaise/fatigue and weight loss.  HENT:  Positive for congestion. Negative for ear discharge and ear pain.        Positive for postnasal drip.  Eyes:  Negative for pain, discharge and redness.  Respiratory:  Negative for cough, sputum production, shortness of breath and wheezing.   Cardiovascular: Negative.  Negative for chest pain and palpitations.  Gastrointestinal:  Negative for abdominal pain, heartburn, nausea and vomiting.  Skin:  Positive for rash. Negative for itching.  Neurological:  Negative for dizziness and headaches.  Endo/Heme/Allergies:  Positive for environmental allergies. Does not bruise/bleed easily.       Objective:   Blood pressure 108/70, pulse 79, temperature 98.4 F (36.9 C), temperature source Temporal, resp. rate 12, height 5' 2.8" (1.595 m), weight 174 lb (78.9 kg), SpO2 98 %, not currently breastfeeding. Body mass index is 31.02 kg/m.     Physical Exam Vitals reviewed.  Constitutional:      Appearance: She is well-developed.  HENT:     Head: Normocephalic and atraumatic.     Right Ear: Tympanic membrane, ear canal and external ear normal. No drainage, swelling or tenderness. Tympanic membrane is not injected, scarred, erythematous, retracted or bulging.     Left Ear: Tympanic membrane,  ear canal  and external ear normal. No drainage, swelling or tenderness. Tympanic membrane is not injected, scarred, erythematous, retracted or bulging.     Nose: No nasal deformity, septal deviation, mucosal edema or rhinorrhea.     Right Turbinates: Enlarged, swollen and pale.     Left Turbinates: Enlarged, swollen and pale.     Right Sinus: No maxillary sinus tenderness or frontal sinus tenderness.     Left Sinus: No maxillary sinus tenderness or frontal sinus tenderness.     Mouth/Throat:     Mouth: Mucous membranes are not pale and not dry.     Pharynx: Uvula midline.  Eyes:     General:        Right eye: No discharge.        Left eye: No discharge.     Conjunctiva/sclera: Conjunctivae normal.     Right eye: Right conjunctiva is not injected. No chemosis.    Left eye: Left conjunctiva is not injected. No chemosis.    Pupils: Pupils are equal, round, and reactive to light.  Cardiovascular:     Rate and Rhythm: Normal rate and regular rhythm.     Heart sounds: Normal heart sounds.  Pulmonary:     Effort: Pulmonary effort is normal. No tachypnea, accessory muscle usage or respiratory distress.     Breath sounds: Normal breath sounds. No wheezing, rhonchi or rales.     Comments: Moving air well in all lung fields.  No increased work of breathing. Chest:     Chest wall: No tenderness.  Abdominal:     Tenderness: There is no abdominal tenderness. There is no guarding or rebound.  Lymphadenopathy:     Head:     Right side of head: No submandibular, tonsillar or occipital adenopathy.     Left side of head: No submandibular, tonsillar or occipital adenopathy.     Cervical: No cervical adenopathy.  Skin:    Coloration: Skin is not pale.     Findings: No abrasion, erythema, petechiae or rash. Rash is not papular, urticarial or vesicular.  Neurological:     Mental Status: She is alert.  Psychiatric:        Behavior: Behavior is cooperative.      Diagnostic studies:    Spirometry: results  normal (FEV1: 3.43/120%, FVC: 4.02/123%, FEV1/FVC: 85%).    Spirometry consistent with normal pattern.    Allergy Studies:     Airborne Adult Perc - 04/08/22 1006     Time Antigen Placed 1011    Allergen Manufacturer Waynette Buttery    Location Back    Number of Test 59    1. Control-Buffer 50% Glycerol Negative    2. Control-Histamine 1 mg/ml 2+    3. Albumin saline Negative    4. Bahia 2+    5. [place] 2+    6. Johnson 2+    7. [place] Blue 2+    8. Meadow Fescue 2+    9. Perennial Rye 2+    10. Sweet Vernal 2+    11. Timothy 2+    12. Cocklebur Negative    13. Burweed Marshelder Negative    14. Ragweed, short 2+    15. Ragweed, Giant 2+    16. Plantain,  English Negative    17. Lamb's Quarters 2+    18. Sheep Sorrell 2+    19. Rough Pigweed 2+    20. Marsh Elder, Rough Negative    21. Mugwort, Common Negative    22. Ash mix Negative  23Charletta Cousin mix Negative    24. Beech American Negative    25. Box, Elder 3+    26. Cedar, red 2+    27. Cottonwood, Eastern 2+    28. Elm mix 2+    29. Hickory Negative    30. Maple mix Negative    31. Oak, Guinea-Bissau mix 4+    32. Pecan Pollen 3+    33. Pine mix 2+    34. Sycamore Eastern Negative    35. Walnut, Black Pollen 3+    36. Alternaria alternata 2+    37. Cladosporium Herbarum 3+    38. Aspergillus mix 2+    39. Penicillium mix 3+    40. Bipolaris sorokiniana (Helminthosporium) 3+    41. Drechslera spicifera (Curvularia) 2+    42. Mucor plumbeus 3+    43. Fusarium moniliforme Negative    44. Aureobasidium pullulans (pullulara) Negative    45. Rhizopus oryzae 3+    46. Botrytis cinera 2+    47. Epicoccum nigrum 2+    48. Phoma betae 2+    49. Candida Albicans 2+    50. Trichophyton mentagrophytes 2+    51. Mite, D Farinae  5,000 AU/ml Negative    52. Mite, D Pteronyssinus  5,000 AU/ml Negative    53. Cat Hair 10,000 BAU/ml Negative    54.  Dog Epithelia Negative    55. Mixed Feathers Negative    56. Horse Epithelia  Negative    57. Cockroach, German 2+    58. Mouse 3+    59. Tobacco Leaf Negative             Food Adult Perc - 04/08/22 1000     Time Antigen Placed 1011    Allergen Manufacturer Waynette Buttery    Location Back    Number of allergen test 1    1. Peanut Negative             Allergy testing results were read and interpreted by myself, documented by clinical staff.         Malachi Bonds, MD [facility]

## 2022-04-09 ENCOUNTER — Encounter: Payer: Self-pay | Admitting: Allergy & Immunology

## 2022-04-11 LAB — TRYPTASE: Tryptase: 5.5 ug/L (ref 2.2–13.2)

## 2022-04-11 LAB — ALLERGEN STINGING INSECT PANEL
Honeybee IgE: <0.1 kU/L
Hornet, White Face, IgE: <0.1 kU/L
Hornet, Yellow, IgE: <0.1 kU/L
Paper Wasp IgE: 0.17 kU/L — AB
Yellow Jacket, IgE: <0.1 kU/L

## 2022-04-11 LAB — ALLERGEN FIRE ANT: I070-IgE Fire Ant (Invicta): <0.1 kU/L

## 2022-04-12 LAB — PEANUT COMPONENTS
F352-IgE Ara h 8: 0.84 kU/L — AB
F422-IgE Ara h 1: <0.1 kU/L
F423-IgE Ara h 2: <0.1 kU/L
F424-IgE Ara h 3: <0.1 kU/L
F427-IgE Ara h 9: 0.19 kU/L — AB
F447-IgE Ara h 6: <0.1 kU/L

## 2022-04-12 LAB — ALLERGEN COMPONENT COMMENTS

## 2022-04-12 LAB — IGE PEANUT W/COMPONENT REFLEX: Peanut, IgE: 0.19 kU/L — AB

## 2022-04-21 NOTE — Progress Notes (Signed)
Looks great. Thank you.

## 2022-05-03 ENCOUNTER — Encounter: Payer: BC Managed Care – PPO | Admitting: Family Medicine

## 2022-05-19 DIAGNOSIS — F339 Major depressive disorder, recurrent, unspecified: Secondary | ICD-10-CM | POA: Diagnosis not present

## 2022-05-25 ENCOUNTER — Encounter: Payer: BC Managed Care – PPO | Admitting: Family Medicine

## 2022-05-25 DIAGNOSIS — J309 Allergic rhinitis, unspecified: Secondary | ICD-10-CM

## 2022-05-25 NOTE — Progress Notes (Deleted)
   [address] [place] [address] Dept: [phone]  FOLLOW UP NOTE  Patient ID: Olivia Hayes, female    DOB: 1995/08/25  Age: 26 y.o. MRN: [id number] Date of Office Visit: 05/25/2022  Assessment  Chief Complaint: No chief complaint on file.  HPI Olivia Hayes is a 26 year old female who presents to the clinic for follow-up visit with peanut challenge.  She was last seen in this clinic on 04/08/2022 by Dr. Dr. For evaluation of exercise-induced asthma, rash, allergic rhinitis, and food allergy to peanut.  She had negative skin prick testing to peanut on 04/08/2022 and peanut IgE was 0.19 on 04/08/2022.  Peanut components were slightly positive to Ara H8 and Ara H9.   Drug Allergies:  Allergies  Allergen Reactions   Peanuts [Peanut Oil] Anaphylaxis and Hives   Naproxen Hives    Physical Exam: There were no vitals taken for this visit.   Physical Exam  Diagnostics:   Procedure note: Written consent obtained {Blank single:19197::"Open graded *** challenge","Open graded *** oral challenge"}: The patient was able to tolerate the challenge today without adverse signs or symptoms. Vital signs were stable throughout the challenge and observation period. She received multiple doses separated by {Blank single:19197::"30 minutes","20 minutes","15 minutes","10 minutes"}, each of which was separated by vitals and a brief physical exam. She received the following doses: lip rub, 1 gm, 2 gm, 4 gm, 8 gm, and 16 gm. She was monitored for 60 minutes following the last dose.   The patient had {Blank single:19197::"***","negative skin prick test and sIgE tests to ***","negative sIgE tests to ***","negative skin prick tests to ***"} and was able to tolerate the open graded oral challenge today without adverse signs or symptoms. Therefore, she has the same risk of systemic reaction associated with {Blank single:19197::"***","the consumption of ***"} as the general population.   Assessment and  Plan: No diagnosis found.  No orders of the defined types were placed in this encounter.   There are no Patient Instructions on file for this visit.  No follow-ups on file.    Thank you for the opportunity to care for this patient.  Please do not hesitate to contact me with questions.  Thermon Leyland, FNP [facility]

## 2022-05-25 NOTE — Patient Instructions (Incomplete)
In office oral peanut challenge *** was able to tolerate the *** food challenge today at the office without adverse signs or symptoms of an allergic reaction. Therefore, *** has the same risk of systemic reaction associated with the consumption of *** as the general population.  - Do not give any ***  for the next 24 hours. - Monitor for allergic symptoms such as rash, wheezing, diarrhea, swelling, and vomiting for the next 24 hours. If severe symptoms occur, treat with EpiPen injection and call 911. For less severe symptoms treat with Benadryl *** teaspoonfuls every *** hours and call the clinic.  - If no allergic symptoms are evident, reintroduce ***  into the diet. If *** develops an allergic reaction to *** , record what was eaten the amount eaten, preparation method, time from ingestion to reaction, and symptoms.   Call the clinic if this treatment plan is not working well for you  Follow up in *** or sooner if needed.

## 2022-05-31 ENCOUNTER — Ambulatory Visit: Payer: BC Managed Care – PPO

## 2022-06-02 DIAGNOSIS — F4312 Post-traumatic stress disorder, chronic: Secondary | ICD-10-CM | POA: Diagnosis not present

## 2022-06-16 DIAGNOSIS — F4312 Post-traumatic stress disorder, chronic: Secondary | ICD-10-CM | POA: Diagnosis not present

## 2022-06-17 ENCOUNTER — Encounter: Payer: Self-pay | Admitting: Family Medicine

## 2022-06-17 ENCOUNTER — Ambulatory Visit (INDEPENDENT_AMBULATORY_CARE_PROVIDER_SITE_OTHER): Payer: BC Managed Care – PPO | Admitting: Family Medicine

## 2022-06-17 VITALS — BP 118/78 | HR 100 | Temp 97.9°F | Wt 176.2 lb

## 2022-06-17 DIAGNOSIS — R52 Pain, unspecified: Secondary | ICD-10-CM

## 2022-06-17 DIAGNOSIS — J4520 Mild intermittent asthma, uncomplicated: Secondary | ICD-10-CM

## 2022-06-17 DIAGNOSIS — J101 Influenza due to other identified influenza virus with other respiratory manifestations: Secondary | ICD-10-CM | POA: Diagnosis not present

## 2022-06-17 LAB — POCT INFLUENZA A/B
Influenza A, POC: POSITIVE — AB
Influenza B, POC: NEGATIVE

## 2022-06-17 LAB — POC COVID19 BINAXNOW: SARS Coronavirus 2 Ag: NEGATIVE

## 2022-06-17 MED ORDER — ALBUTEROL SULFATE HFA 108 (90 BASE) MCG/ACT IN AERS: 2.0000 | INHALATION_SPRAY | Freq: Four times a day (QID) | RESPIRATORY_TRACT | 0 refills | Status: DC | PRN

## 2022-06-17 MED ORDER — OSELTAMIVIR PHOSPHATE 75 MG PO CAPS: 75.0000 mg | ORAL_CAPSULE | Freq: Two times a day (BID) | ORAL | 0 refills | Status: DC

## 2022-06-17 NOTE — Patient Instructions (Signed)
Please be sure to drink plenty of fluids.  Take tamiflu  May use albuterol inhaler as needed for wheezing or cough You may take the following OTC medications to help with symptoms:  For cough, use  cough syrups or other cough suppressants.  For headache, sore throat, fevers, muscle aches, chills, other pain, take ibuprofen or tylenol  For congestion, use nasal sprays, decongestants, or antihistamines  Please follow up if no improvement.   Go to ED if you have severe chest pain, fevers, shortness of breath or other worrisome symptoms.

## 2022-06-17 NOTE — Progress Notes (Signed)
Assessment/Plan:   Problem List Items Addressed This Visit   None Visit Diagnoses     Body aches    -  Primary   Relevant Orders   POCT Influenza A/B (Completed)   POC COVID-19 (Completed)   Influenza A       Relevant Medications   oseltamivir (TAMIFLU) 75 MG capsule   Mild intermittent asthma without complication       Relevant Medications   albuterol (VENTOLIN HFA) 108 (90 Base) MCG/ACT inhaler      Flu A. Course mild. Tamiflu and OTC remedies as needed.  Will send albuterol inhaler due to h/o asthma, but no indication to use it at this time.  ED and return precautions discussed    Subjective:  HPI:  Olivia Hayes is a 26 y.o. female who has Multiple allergies; Acne; Globus sensation; Inattention; History of DVT (deep vein thrombosis); Herpes simplex; Rectal bleeding; Seasonal allergic rhinitis; Intrauterine device; Trichomoniasis; Aphthous ulcer of mouth; Drug reaction; History of ecchymosis; Obesity (BMI 30.0-34.9); Stress fx pelvis; Keratosis pilaris; and Urticaria on their problem list..   She  has a past medical history of Asthma, BV (bacterial vaginosis), Depression, DVT (deep venous thrombosis) (HCC), HSV-1 (herpes simplex virus 1) infection, Pelvic floor dysfunction, Urticaria, and Yeast infection..   She presents with chief complaint of sinus pressure (Sinus pressure, body aches, cough, x 2 days. ) .   Tolerating PO. No CP or SOB.   Influenza This is a new problem. The current episode started yesterday. The problem occurs constantly. The problem has been gradually worsening. Associated symptoms include chills, congestion, coughing, fatigue and headaches. Pertinent negatives include no abdominal pain, change in bowel habit, chest pain, fever or vomiting. She has tried acetaminophen and NSAIDs for the symptoms. The treatment provided mild relief.   H/o asthma, exercise induced. Has not used albuerol inhaler. No wheezing.   Past Surgical History:  Procedure  Laterality Date   ABDOMINAL HERNIA REPAIR  age 35   INDUCED ABORTION      Outpatient Medications Prior to Visit  Medication Sig Dispense Refill   diphenhydrAMINE (BENADRYL) 50 MG tablet Take 1 tablet (50 mg total) by mouth every 8 (eight) hours as needed for itching. (Patient not taking: Reported on 06/17/2022) 30 tablet 0   Olopatadine-Mometasone (RYALTRIS) 665-25 MCG/ACT SUSP Do two sprays per nostril 1-2 times daily as needed (Patient not taking: Reported on 06/17/2022) 29 g 5   No facility-administered medications prior to visit.    Family History  Problem Relation Age of Onset   Asthma Mother    Allergic rhinitis Mother    Thyroid disease Mother    Colon cancer Neg Hx    Pancreatic cancer Neg Hx    Esophageal cancer Neg Hx    Stomach cancer Neg Hx    Rectal cancer Neg Hx    Liver cancer Neg Hx     Social History   Socioeconomic History   Marital status: Married    Spouse name: Donnie   Number of children: 1   Years of education: Not on file   Highest education level: Not on file  Occupational History   Not on file  Tobacco Use   Smoking status: Never    Passive exposure: Past   Smokeless tobacco: Never  Vaping Use   Vaping Use: Never used  Substance and Sexual Activity   Alcohol use: Yes    Comment: Social   Drug use: Not Currently    Types: Marijuana  Comment: last smoked beginning February 2021   Sexual activity: Yes    Birth control/protection: None    Comment: intercourse age 6, less than 5 sexua partners  Other Topics Concern   Not on file  Social History Narrative   Not on file   Social Determinants of Health   Financial Resource Strain: Not on file  Food Insecurity: No Food Insecurity (04/29/2021)   Hunger Vital Sign    Worried About Running Out of Food in the Last Year: Never true    Ran Out of Food in the Last Year: Never true  Recent Concern: Food Insecurity - Food Insecurity Present (03/06/2021)   Hunger Vital Sign    Worried About  Running Out of Food in the Last Year: Sometimes true    Ran Out of Food in the Last Year: Never true  Transportation Needs: No Transportation Needs (04/29/2021)   PRAPARE - Administrator, Civil Service (Medical): No    Lack of Transportation (Non-Medical): No  Physical Activity: Not on file  Stress: Not on file  Social Connections: Not on file  Intimate Partner Violence: Not At Risk (10/10/2019)   Humiliation, Afraid, Rape, and Kick questionnaire    Fear of Current or Ex-Partner: No    Emotionally Abused: No    Physically Abused: No    Sexually Abused: No                                                                                                 Objective:  Physical Exam: BP 118/78 (BP Location: Left Arm, Patient Position: Sitting, Cuff Size: Large)   Pulse 100   Temp 97.9 F (36.6 C) (Temporal)   Wt 176 lb 3.2 oz (79.9 kg)   SpO2 94%   BMI 31.42 kg/m    General: No acute distress. Awake and conversant.  Eyes: Normal conjunctiva, anicteric. Round symmetric pupils.  ENT: Hearing grossly intact. No nasal discharge.  Neck: Neck is supple. No masses or thyromegaly.  Respiratory: Respirations are non-labored. CTAB.  Skin: Warm. No rashes or ulcers.  Psych: Alert and oriented. Cooperative, Appropriate mood and affect, Normal judgment.  CV: No cyanosis or JVD, RRR, no MRG MSK: Normal ambulation. No clubbing  Neuro: Sensation and CN II-XII grossly normal.   Results for orders placed or performed in visit on 06/17/22  POCT Influenza A/B  Result Value Ref Range   Influenza A, POC Positive (A) Negative   Influenza B, POC Negative Negative  POC COVID-19  Result Value Ref Range   SARS Coronavirus 2 Ag Negative Negative         Garner Nash, MD, MS

## 2022-07-07 DIAGNOSIS — F4312 Post-traumatic stress disorder, chronic: Secondary | ICD-10-CM | POA: Diagnosis not present

## 2022-07-14 DIAGNOSIS — F4312 Post-traumatic stress disorder, chronic: Secondary | ICD-10-CM | POA: Diagnosis not present

## 2022-07-16 ENCOUNTER — Telehealth: Payer: Self-pay | Admitting: Family Medicine

## 2022-07-16 ENCOUNTER — Ambulatory Visit: Payer: BC Managed Care – PPO | Admitting: Family Medicine

## 2022-07-16 NOTE — Telephone Encounter (Signed)
No show 1/12 for OV with Dr. Gena Fray. Second, did not send letteer

## 2022-07-21 ENCOUNTER — Encounter: Payer: Self-pay | Admitting: Family Medicine

## 2022-07-21 ENCOUNTER — Ambulatory Visit (INDEPENDENT_AMBULATORY_CARE_PROVIDER_SITE_OTHER): Payer: BC Managed Care – PPO | Admitting: Family Medicine

## 2022-07-21 VITALS — BP 118/66 | HR 104 | Temp 97.3°F | Ht 62.5 in | Wt 174.2 lb

## 2022-07-21 DIAGNOSIS — J069 Acute upper respiratory infection, unspecified: Secondary | ICD-10-CM

## 2022-07-21 DIAGNOSIS — Z3009 Encounter for other general counseling and advice on contraception: Secondary | ICD-10-CM | POA: Diagnosis not present

## 2022-07-21 DIAGNOSIS — Z113 Encounter for screening for infections with a predominantly sexual mode of transmission: Secondary | ICD-10-CM

## 2022-07-21 NOTE — Progress Notes (Signed)
[facility] [address] [place] [address] Dept: [phone] Dept Fax: [phone]  Office Visit  Subjective:    Patient ID: Olivia Hayes, female    DOB: 03/26/1996, 27 y.o..   MRN: [id number]  Chief Complaint  Patient presents with   Acute Visit    Would like to get STD testing.  Also c/o that having RT ear clogged and recently had ST x 1 week.     History of Present Illness:  Patient is in today for requesting an STD check. Olivia Hayes notes she is now separated form her husband, in part due to ongoing marital infidelity. He has given her STDs in the past. She continues to worry about this. She has not had any symptoms of an STD, but does want this checked. 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 She worries she might have HSV-2 that has not been identified.  Olivia Hayes has had a recent issues with nasal congestion and her right ear feeling plugged. She is not running fever or having significant cough.  Olivia Hayes has a Paraguard IUD. She has some concerns about this. she notes she gets occasional pains low on the right side of her abdomen. She also has heard that an IUD can leave a woman infertile. She has seen a gynecologist, but has not gone in recently.  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   Keratosis pilaris 11/18/2021   Stress fx pelvis 09/25/2021   Obesity (BMI 30.0-34.9) 08/26/2021   Aphthous ulcer of mouth 03/17/2021   Drug reaction 03/17/2021   History of ecchymosis 03/17/2021   Trichomoniasis 12/28/2020   Intrauterine device 12/22/2020   Seasonal allergic rhinitis 09/12/2020   Rectal bleeding 06/12/2020   History of DVT (deep vein thrombosis) 08/24/2019   Herpes simplex 10/15/2018   Inattention 01/06/2018   Globus sensation 07/11/2015   Acne 03/28/2013   Multiple allergies 02/21/2013   Past Surgical History:   Procedure Laterality Date   ABDOMINAL HERNIA REPAIR  age 75   INDUCED ABORTION     Family History  Problem Relation Age of Onset   Asthma Mother    Allergic rhinitis Mother    Thyroid disease Mother    Colon cancer Neg Hx    Pancreatic cancer Neg Hx    Esophageal cancer Neg Hx    Stomach cancer Neg Hx    Rectal cancer Neg Hx    Liver cancer Neg Hx    Outpatient Medications Prior to Visit  Medication Sig Dispense Refill   albuterol (VENTOLIN HFA) 108 (90 Base) MCG/ACT inhaler Inhale 2 puffs into the lungs every 6 (six) hours as needed for wheezing or shortness of breath. 8 g 0   diphenhydrAMINE (BENADRYL) 50 MG tablet Take 1 tablet (50 mg total) by mouth every 8 (eight) hours as needed for itching. 30 tablet 0   Olopatadine-Mometasone (RYALTRIS) 665-25 MCG/ACT SUSP Do two sprays per nostril 1-2 times daily as needed (Patient not taking: Reported on 06/17/2022) 29 g 5   oseltamivir (TAMIFLU) 75 MG capsule Take 1 capsule (75 mg total) by mouth 2 (two) times daily. 10 capsule 0   No facility-administered medications prior to visit.   Allergies  Allergen Reactions   Peanuts [Peanut Oil] Anaphylaxis and Hives   Naproxen Hives     Objective:   Today's Vitals   07/21/22 1343  BP: 118/66  Pulse: (!) 104  Temp: Marland Kitchen)  97.3 F (36.3 C)  TempSrc: Temporal  SpO2: 98%  Weight: 174 lb 3.2 oz (79 kg)  Height: 5' 2.5" (1.588 m)   Body mass index is 31.35 kg/m.   General: Well developed, well nourished. No acute distress. HEENT: Normocephalic, non-traumatic. Conjunctiva clear. External ears normal. EAC and TMs normal   bilaterally. Nose clear without congestion or rhinorrhea. Mucous membranes moist. Oropharynx clear.   Good dentition. Neck: Supple. No lymphadenopathy. No thyromegaly. Lungs: Clear to auscultation bilaterally. No wheezing, rales or rhonchi. Psych: Alert and oriented. Normal mood and affect.  There are no preventive care reminders to display for this patient.     Assessment & Plan:   1. Screen for STD (sexually transmitted disease) I reviewed lab results Olivia Hayes had for her husband. These STD tests were negative. I will go ahead and conduct STD testing for her today, including a check for herpes.   - Chlamydia/GC NAA, Confirmation - HIV Antibody (routine testing w rflx) - HSV(herpes simplex vrs) 1+2 ab-IgG - RPR  2. Viral URI Discussed home care for viral illness, including rest, pushing fluids, and OTC medications as needed for symptom relief. Recommend hot tea with honey for sore throat symptoms. Follow-up if needed for worsening or persistent symptoms.  3. Counseling for birth control regarding intrauterine device (IUD) We did discuss that IUDs can be associated with an increased risk for PID in women who are high risk for contact of STDs. She is on Paraguard because of an issue taking hormones. I did reassure her about the benefits of an IUD as a non-hormonal reversible long-term birth control option that is not typically associated with infertility.   Return if symptoms worsen or fail to improve.   Haydee Salter, MD

## 2022-07-22 ENCOUNTER — Encounter: Payer: Self-pay | Admitting: Family Medicine

## 2022-07-22 LAB — RPR: RPR Ser Ql: NONREACTIVE

## 2022-07-22 LAB — HSV(HERPES SIMPLEX VRS) I + II AB-IGG
HSV 1 IGG,TYPE SPECIFIC AB: 22.9 {index} — ABNORMAL HIGH
HSV 2 IGG,TYPE SPECIFIC AB: <0.9 {index}

## 2022-07-22 LAB — HIV ANTIBODY (ROUTINE TESTING W REFLEX): HIV 1&2 Ab, 4th Generation: NONREACTIVE

## 2022-07-22 NOTE — Telephone Encounter (Signed)
same day cancellation/2nd no show (prior late arrival), fee generated, final warning sent via mail and mychart

## 2022-07-23 LAB — CHLAMYDIA/GC NAA, CONFIRMATION
Chlamydia trachomatis, NAA: NEGATIVE
Neisseria gonorrhoeae, NAA: NEGATIVE

## 2022-07-28 DIAGNOSIS — F4312 Post-traumatic stress disorder, chronic: Secondary | ICD-10-CM | POA: Diagnosis not present

## 2022-08-04 DIAGNOSIS — F4312 Post-traumatic stress disorder, chronic: Secondary | ICD-10-CM | POA: Diagnosis not present

## 2022-08-08 ENCOUNTER — Encounter: Payer: Self-pay | Admitting: Family Medicine

## 2022-08-10 ENCOUNTER — Ambulatory Visit (INDEPENDENT_AMBULATORY_CARE_PROVIDER_SITE_OTHER): Payer: BC Managed Care – PPO | Admitting: Family Medicine

## 2022-08-10 VITALS — BP 110/70 | HR 105 | Temp 98.2°F | Ht 62.5 in | Wt 172.8 lb

## 2022-08-10 DIAGNOSIS — L089 Local infection of the skin and subcutaneous tissue, unspecified: Secondary | ICD-10-CM

## 2022-08-10 DIAGNOSIS — M79604 Pain in right leg: Secondary | ICD-10-CM | POA: Diagnosis not present

## 2022-08-10 MED ORDER — MUPIROCIN 2 % EX OINT: 1.0000 | TOPICAL_OINTMENT | Freq: Two times a day (BID) | CUTANEOUS | 0 refills | Status: DC

## 2022-08-10 NOTE — Patient Instructions (Signed)
Recommend hot packs for 15 min 3-4 times a day. Apply mupirocin twice a day until resolved.

## 2022-08-10 NOTE — Progress Notes (Signed)
[facility] [address] [place] [address] Dept: [phone] Dept Fax: [phone]  Office Visit  Subjective:    Patient ID: Olivia Hayes, female    DOB: Dec 26, 1995, 27 y.o..   MRN: [id number]  Chief Complaint  Patient presents with   Acute Visit    C/o having LT leg pain and a pot on forehead.      History of Present Illness:  Patient is in today having experienced some right popliteal pain yesterday. She did not have any recent injury. She does go to the gym, so was not sure if this was due to her exercise. She was worried in regards to her past history of a DVT. She notes her prior DVT appears to have been provoked by OCP use and smoking. She currently uses a Paragard IUD. She does occasionally smoke a hookah. Today, the pain is resolved. She has no lower leg swelling.  Also, [name] notes in the past 1-2 days, she has developed a swollen, tender are between her eyes. Her father told her this may be a cyst.  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   Keratosis pilaris 11/18/2021   Stress fx pelvis 09/25/2021   Obesity (BMI 30.0-34.9) 08/26/2021   Aphthous ulcer of mouth 03/17/2021   Drug reaction 03/17/2021   History of ecchymosis 03/17/2021   Trichomoniasis 12/28/2020   Intrauterine device 12/22/2020   Seasonal allergic rhinitis 09/12/2020   Rectal bleeding 06/12/2020   History of DVT (deep vein thrombosis) 08/24/2019   Herpes simplex 10/15/2018   Inattention 01/06/2018   Globus sensation 07/11/2015   Acne 03/28/2013   Multiple allergies 02/21/2013   Past Surgical History:  Procedure Laterality Date   ABDOMINAL HERNIA REPAIR  age 30   INDUCED ABORTION     Family History  Problem Relation Age of Onset   Asthma Mother    Allergic rhinitis Mother    Thyroid disease Mother    Colon cancer Neg Hx    Pancreatic cancer Neg Hx    Esophageal  cancer Neg Hx    Stomach cancer Neg Hx    Rectal cancer Neg Hx    Liver cancer Neg Hx    Outpatient Medications Prior to Visit  Medication Sig Dispense Refill   albuterol (VENTOLIN HFA) 108 (90 Base) MCG/ACT inhaler Inhale 2 puffs into the lungs every 6 (six) hours as needed for wheezing or shortness of breath. 8 g 0   diphenhydrAMINE (BENADRYL) 50 MG tablet Take 1 tablet (50 mg total) by mouth every 8 (eight) hours as needed for itching. 30 tablet 0   No facility-administered medications prior to visit.   Allergies  Allergen Reactions   Peanuts [Peanut Oil] Anaphylaxis and Hives   Naproxen Hives     Objective:   Today's Vitals   08/10/22 1115  BP: 110/70  Pulse: (!) 105  Temp: 98.2 F (36.8 C)  TempSrc: Temporal  SpO2: 96%  Weight: 172 lb 12.8 oz (78.4 kg)  Height: 5' 2.5" (1.588 m)   Body mass index is 31.1 kg/m.   General: Well developed, well nourished. No acute distress. Extremities: No tenderness to palpation of the right popliteal fossa. No lower leg swelling or edema. Skin: Warm and dry. There is a 3-4 mm area of induration with overlying erythema located between the brows.   This is not fluctuant. Neuro: CN II-XII intact. Normal sensation and DTR bilaterally. Psych: Alert and  oriented. Normal mood and affect.  There are no preventive care reminders to display for this patient.    Assessment & Plan:   Problem List Items Addressed This Visit   None Visit Diagnoses     Right leg pain    -  Primary   Issue resolved today. Discussed low risk of recurrent DVT for a person with a prior transient provoked DVT.   Local skin infection       This appears to be a localized soft tissue infeciton. Recommend hot packs and use of mupirocin.   Relevant Medications   mupirocin ointment (BACTROBAN) 2 %       Return if symptoms worsen or fail to improve.   Haydee Salter, MD

## 2022-08-11 DIAGNOSIS — F4312 Post-traumatic stress disorder, chronic: Secondary | ICD-10-CM | POA: Diagnosis not present

## 2022-08-20 DIAGNOSIS — F4312 Post-traumatic stress disorder, chronic: Secondary | ICD-10-CM | POA: Diagnosis not present

## 2022-09-24 DIAGNOSIS — F4312 Post-traumatic stress disorder, chronic: Secondary | ICD-10-CM | POA: Diagnosis not present

## 2022-09-27 ENCOUNTER — Telehealth: Payer: Self-pay | Admitting: Family Medicine

## 2022-09-27 ENCOUNTER — Encounter (HOSPITAL_COMMUNITY): Payer: Self-pay

## 2022-09-27 ENCOUNTER — Emergency Department (HOSPITAL_COMMUNITY)
Admission: EM | Admit: 2022-09-27 | Discharge: 2022-09-27 | Disposition: A | Discharge status: Home / Self Care | Payer: BC Managed Care – PPO | Attending: Emergency Medicine | Admitting: Emergency Medicine

## 2022-09-27 ENCOUNTER — Other Ambulatory Visit: Payer: Self-pay

## 2022-09-27 DIAGNOSIS — F4312 Post-traumatic stress disorder, chronic: Secondary | ICD-10-CM | POA: Diagnosis not present

## 2022-09-27 DIAGNOSIS — R1013 Epigastric pain: Secondary | ICD-10-CM | POA: Diagnosis not present

## 2022-09-27 DIAGNOSIS — Z91010 Allergy to peanuts: Secondary | ICD-10-CM | POA: Insufficient documentation

## 2022-09-27 LAB — CBC WITH DIFFERENTIAL/PLATELET
Abs Immature Granulocytes: 0.04 10*3/uL (ref 0.00–0.07)
Basophils Absolute: 0.1 10*3/uL (ref 0.0–0.1)
Basophils Relative: 1 %
Eosinophils Absolute: 0.2 10*3/uL (ref 0.0–0.5)
Eosinophils Relative: 2 %
HCT: 46.5 % — ABNORMAL HIGH (ref 36.0–46.0)
Hemoglobin: 14.5 g/dL (ref 12.0–15.0)
Immature Granulocytes: 1 %
Lymphocytes Relative: 28 %
Lymphs Abs: 2.1 10*3/uL (ref 0.7–4.0)
MCH: 28.4 pg (ref 26.0–34.0)
MCHC: 31.2 g/dL (ref 30.0–36.0)
MCV: 91 fL (ref 80.0–100.0)
Monocytes Absolute: 0.5 10*3/uL (ref 0.1–1.0)
Monocytes Relative: 6 %
Neutro Abs: 4.7 10*3/uL (ref 1.7–7.7)
Neutrophils Relative %: 62 %
Platelets: 237 10*3/uL (ref 150–400)
RBC: 5.11 MIL/uL (ref 3.87–5.11)
RDW: 14 % (ref 11.5–15.5)
WBC: 7.5 10*3/uL (ref 4.0–10.5)
nRBC: 0 % (ref 0.0–0.2)

## 2022-09-27 LAB — COMPREHENSIVE METABOLIC PANEL
ALT: 24 U/L (ref 0–44)
AST: 26 U/L (ref 15–41)
Albumin: 3.8 g/dL (ref 3.5–5.0)
Alkaline Phosphatase: 44 U/L (ref 38–126)
Anion gap: 8 (ref 5–15)
BUN: 7 mg/dL (ref 6–20)
CO2: 24 mmol/L (ref 22–32)
Calcium: 8.7 mg/dL — ABNORMAL LOW (ref 8.9–10.3)
Chloride: 104 mmol/L (ref 98–111)
Creatinine, Ser: 0.68 mg/dL (ref 0.44–1.00)
GFR, Estimated: >60 mL/min (ref >60)
Glucose, Bld: 89 mg/dL (ref 70–99)
Potassium: 4.4 mmol/L (ref 3.5–5.1)
Sodium: 136 mmol/L (ref 135–145)
Total Bilirubin: 0.7 mg/dL (ref 0.3–1.2)
Total Protein: 6.8 g/dL (ref 6.5–8.1)

## 2022-09-27 LAB — URINALYSIS, ROUTINE W REFLEX MICROSCOPIC
Bilirubin Urine: NEGATIVE
Glucose, UA: NEGATIVE mg/dL
Hgb urine dipstick: NEGATIVE
Ketones, ur: NEGATIVE mg/dL
Leukocytes,Ua: NEGATIVE
Nitrite: NEGATIVE
Protein, ur: NEGATIVE mg/dL
Specific Gravity, Urine: 1.019 (ref 1.005–1.030)
pH: 6 (ref 5.0–8.0)

## 2022-09-27 LAB — LIPASE, BLOOD: Lipase: 31 U/L (ref 11–51)

## 2022-09-27 LAB — PREGNANCY, URINE: Preg Test, Ur: NEGATIVE

## 2022-09-27 MED ORDER — ONDANSETRON HCL 4 MG/2ML IJ SOLN: 4.0000 mg | Freq: Once | INTRAMUSCULAR | Status: DC

## 2022-09-27 MED ORDER — HYDROCODONE-ACETAMINOPHEN 5-325 MG PO TABS
1.0000 | ORAL_TABLET | Freq: Once | ORAL | Status: AC
  Administered 2022-09-27: 1 via ORAL
  Filled 2022-09-27: qty 1

## 2022-09-27 MED ORDER — DICYCLOMINE HCL 10 MG/5ML PO SOLN: 10.0000 mg | Freq: Once | ORAL | Status: DC

## 2022-09-27 MED ORDER — HYOSCYAMINE SULFATE 0.125 MG SL SUBL
0.2500 mg | SUBLINGUAL_TABLET | Freq: Once | SUBLINGUAL | Status: AC
  Administered 2022-09-27: 0.25 mg via SUBLINGUAL
  Filled 2022-09-27: qty 2

## 2022-09-27 MED ORDER — FAMOTIDINE 20 MG PO TABS: 20.0000 mg | ORAL_TABLET | Freq: Two times a day (BID) | ORAL | 0 refills | Status: DC

## 2022-09-27 MED ORDER — DICYCLOMINE HCL 10 MG PO CAPS
10.0000 mg | ORAL_CAPSULE | Freq: Once | ORAL | Status: AC
  Administered 2022-09-27: 10 mg via ORAL
  Filled 2022-09-27: qty 1

## 2022-09-27 MED ORDER — ALUM & MAG HYDROXIDE-SIMETH 200-200-20 MG/5ML PO SUSP
30.0000 mL | Freq: Once | ORAL | Status: AC
  Administered 2022-09-27: 30 mL via ORAL
  Filled 2022-09-27: qty 30

## 2022-09-27 MED ORDER — MORPHINE SULFATE (PF) 4 MG/ML IV SOLN: 4.0000 mg | Freq: Once | INTRAVENOUS | Status: DC

## 2022-09-27 NOTE — ED Provider Notes (Signed)
[place] EMERGENCY DEPARTMENT AT [facility] Provider Note   CSN: [id number] Arrival date & time: 09/27/22  1523     History  Chief Complaint  Patient presents with   Abdominal Pain   HPI Olivia Hayes is a 27 y.o. female presenting for abdominal pain.  Started at midnight this morning.  Located in the epigastric region radiating to the back.  It is constant and sharp.  Unable to sleep because of the pain.  Hurts worse when she takes a deep breath.  She vomited twice 2 days ago but has not since her abdominal pain started.  Last bowel movement was this morning.  Denies urinary changes.  Denies bloody output.  Patient states she is participating in Ramadan.  Denies recent alcohol consumption.   Abdominal Pain      Home Medications Prior to Admission medications   Medication Sig Start Date End Date Taking? Authorizing Provider  famotidine (PEPCID) 20 MG tablet Take 1 tablet (20 mg total) by mouth 2 (two) times daily. 09/27/22  Yes Gareth Eagle, PA-C  albuterol (VENTOLIN HFA) 108 (90 Base) MCG/ACT inhaler Inhale 2 puffs into the lungs every 6 (six) hours as needed for wheezing or shortness of breath. 06/17/22   Garnette Gunner, MD  diphenhydrAMINE (BENADRYL) 50 MG tablet Take 1 tablet (50 mg total) by mouth every 8 (eight) hours as needed for itching. 02/12/22   Loyola Mast, MD  mupirocin ointment (BACTROBAN) 2 % Apply 1 Application topically 2 (two) times daily. 08/10/22   Loyola Mast, MD      Allergies    Peanuts [peanut oil] and Naproxen    Review of Systems   Review of Systems  Gastrointestinal:  Positive for abdominal pain.    Physical Exam   Vitals:   09/27/22 1609 09/27/22 1948  BP: 133/76 133/75  Pulse: 83 85  Resp:  16  Temp: 97.9 F (36.6 C) 97.9 F (36.6 C)  SpO2: 100% 100%    CONSTITUTIONAL:  well-appearing, NAD NEURO:  Alert and oriented x 3, CN 3-12 grossly intact EYES:  eyes equal and reactive ENT/NECK:  Supple, no stridor   CARDIO:  regular rate and rhythm, appears well-perfused  PULM:  No respiratory distress, CTAB GI/GU:  non-distended, soft, epigastric tenderness MSK/SPINE:  No gross deformities, no edema, moves all extremities  SKIN:  no rash, atraumatic  *Additional and/or pertinent findings included in MDM below  ED Results / Procedures / Treatments   Labs (all labs ordered are listed, but only abnormal results are displayed) Labs Reviewed  CBC WITH DIFFERENTIAL/PLATELET - Abnormal; Notable for the following components:      Result Value   HCT 46.5 (*)    All other components within normal limits  COMPREHENSIVE METABOLIC PANEL - Abnormal; Notable for the following components:   Calcium 8.7 (*)    All other components within normal limits  URINALYSIS, ROUTINE W REFLEX MICROSCOPIC - Abnormal; Notable for the following components:   APPearance HAZY (*)    All other components within normal limits  LIPASE, BLOOD  PREGNANCY, URINE    EKG None  Radiology No results found.  Procedures Procedures    Medications Ordered in ED Medications  hyoscyamine (LEVSIN SL) SL tablet 0.25 mg (0.25 mg Sublingual Given 09/27/22 1857)  alum & mag hydroxide-simeth (MAALOX/MYLANTA) 200-200-20 MG/5ML suspension 30 mL (30 mLs Oral Given 09/27/22 1857)  HYDROcodone-acetaminophen (NORCO/VICODIN) 5-325 MG per tablet 1 tablet (1 tablet Oral Given 09/27/22 1857)  dicyclomine (BENTYL)  capsule 10 mg (10 mg Oral Given 09/27/22 1857)    ED Course/ Medical Decision Making/ A&P                             Medical Decision Making  Initial Impression and Ddx 27 year old female who is well-appearing presenting for abdominal pain.  Exam remarkable for epigastric tenderness.  DDx includes pancreatitis, acute cholecystitis, reflux, other intra-abdominal infection, dehydration, ACS. Patient PMH that increases complexity of ED encounter:  none  Interpretation of Diagnostics - I independent reviewed and interpreted the labs  as followed: no acute derangement  Patient Reassessment and Ultimate Disposition/Management Treated with GI cocktail.  Treated pain with Norco.  Upon reassessment patient felt better overall.  Suspect reflux.  Sent Pepcid to patient's pharmacy.  Advised to follow-up with her PCP.  Discussed pertinent return precautions.  Doubt dehydration, ACS, and intra-abdominal infection given reassuring workup.  Patient management required discussion with the following services or consulting groups:  None  Complexity of Problems Addressed Acute complicated illness or Injury  Additional Data Reviewed and Analyzed Further history obtained from: None  Patient Encounter Risk Assessment Prescriptions         Final Clinical Impression(s) / ED Diagnoses Final diagnoses:  Epigastric pain    Rx / DC Orders ED Discharge Orders          Ordered    famotidine (PEPCID) 20 MG tablet  2 times daily        09/27/22 1902              Gareth Eagle, PA-C 09/27/22 1952    Lorre Nick, MD 09/28/22 [phone]

## 2022-09-27 NOTE — ED Triage Notes (Signed)
Pt presents with mid epigastric pain and pain to her right flank/back.  Pt states she called her PCP who sent her to urgent care and then they sent her here for eval of possible pancreatitis. One episode of vomiting 2 days ago and non since then

## 2022-09-27 NOTE — Telephone Encounter (Signed)
FYI:Pt called in complaining of a sharp pain in her abdominal area that started last night around midnight. She said "it feels like there is a knife in my stomach", no other symptoms. I transferred her over to nurse triage.

## 2022-09-27 NOTE — ED Provider Triage Note (Signed)
Emergency Medicine Provider Triage Evaluation Note  Olivia Hayes , a 27 y.o. female  was evaluated in triage.  Pt complains of upper abdominal pain onset yesterday. Pt notes that she is currently fasting for Ramadan.  Denies EtOH use or excessive Tylenol use.  Patient still has her gallbladder and appendix.  Has associated vomiting onset 2 days ago.  Denies nausea or urinary symptoms.  Review of Systems  Positive:  Negative:   Physical Exam  BP 133/76 (BP Location: Right Arm)   Pulse 83   Temp 97.9 F (36.6 C) (Oral)   SpO2 100%  Gen:   Awake, no distress   Resp:  Normal effort  MSK:   Moves extremities without difficulty  Other:  Tenderness to palpation noted to upper abdominal region.  Medical Decision Making  Medically screening exam initiated at 4:22 PM.  Appropriate orders placed.  Britanya Reincke was informed that the remainder of the evaluation will be completed by another provider, this initial triage assessment does not replace that evaluation, and the importance of remaining in the ED until their evaluation is complete.  Work-up initiated.    Meshell Abdulaziz A, PA-C 09/27/22 1622

## 2022-09-27 NOTE — Discharge Instructions (Signed)
Evaluation today for your epigastric abdominal pain was overall reassuring.  I suspect this is reflux.  I have started you on famotidine which is an antacid.  Recommend that you start taking this for your symptoms and follow-up with your PCP.  If your abdominal pain worsens, you have blood in your stool, vomit or urine or any other concerning symptom please return emergency department further evaluation.

## 2022-09-27 NOTE — Telephone Encounter (Signed)
Nurse triage advises pt to go to ED. Pt refuses due to lack of child care. No providers with availability in the office today. Spoke with pt's spouse to suggest going to the ED for evaluation. Link for [facility] sent to [facility].

## 2022-09-28 ENCOUNTER — Ambulatory Visit: Payer: Self-pay

## 2022-09-28 ENCOUNTER — Telehealth: Payer: Self-pay

## 2022-09-28 NOTE — Transitions of Care (Post Inpatient/ED Visit) (Signed)
   09/28/2022  Name: Olivia Hayes MRN: [id number] DOB: May 23, 1996  Today's TOC FU Call Status:    Attempted to reach the patient regarding the most recent Inpatient/ED visit.  Follow Up Plan: Additional outreach attempts will be made to reach the patient to complete the Transitions of Care (Post Inpatient/ED visit) call.   Signature Angeline Slim, BSN, Therapist, sports

## 2022-09-29 ENCOUNTER — Telehealth: Payer: Self-pay | Admitting: Family Medicine

## 2022-09-29 NOTE — Telephone Encounter (Signed)
Can you call the pt concerning issue with pain sharp pain in stomach

## 2022-09-29 NOTE — Transitions of Care (Post Inpatient/ED Visit) (Signed)
   09/29/2022  Name: Olivia Hayes MRN: [id number] DOB: 16-Oct-1995  Today's TOC FU Call Status: Today's TOC FU Call Status:: Successful TOC FU Call Competed TOC FU Call Complete Date: 09/29/22  Transition Care Management Follow-up Telephone Call Date of Discharge: 09/27/22 Discharge Facility: [facility]) Type of Discharge: Emergency Department Reason for ED Visit: Other: (epigastric pain) How have you been since you were released from the hospital?: Better Any questions or concerns?: No  Items Reviewed: Did you receive and understand the discharge instructions provided?: Yes Medications obtained and verified?: Yes (Medications Reviewed) Any new allergies since your discharge?: No Dietary orders reviewed?: No Do you have support at home?: Yes People in Home: spouse  Home Care and Equipment/Supplies: [place] Ordered?: NA Any new equipment or medical supplies ordered?: NA  Functional Questionnaire: Do you need assistance with bathing/showering or dressing?: No Do you need assistance with meal preparation?: No Do you need assistance with eating?: No Do you have difficulty maintaining continence: No Do you need assistance with getting out of bed/getting out of a chair/moving?: No Do you have difficulty managing or taking your medications?: No  Follow up appointments reviewed: PCP Follow-up appointment confirmed?: No MD Provider Line [phone] Given: No Specialist Hospital Follow-up appointment confirmed?: No Do you need transportation to your follow-up appointment?: No Do you understand care options if your condition(s) worsen?: Yes-patient verbalized understanding    SIGNATURE Angeline Slim, BSN, RN

## 2022-09-30 NOTE — Telephone Encounter (Signed)
TOC call completed 

## 2022-10-04 ENCOUNTER — Telehealth: Payer: Self-pay

## 2022-10-04 ENCOUNTER — Encounter: Payer: Self-pay | Admitting: Family Medicine

## 2022-10-04 ENCOUNTER — Ambulatory Visit (INDEPENDENT_AMBULATORY_CARE_PROVIDER_SITE_OTHER): Payer: BC Managed Care – PPO | Admitting: Family Medicine

## 2022-10-04 VITALS — BP 118/66 | HR 86 | Temp 98.2°F | Ht 62.0 in | Wt 168.2 lb

## 2022-10-04 DIAGNOSIS — Z113 Encounter for screening for infections with a predominantly sexual mode of transmission: Secondary | ICD-10-CM

## 2022-10-04 NOTE — Telephone Encounter (Signed)
Fyi. Patient hasn't had any water due to religious fasting. Patient stated that she fasts from sun up to sun down. I advised patient if she is able to drink the night before her lab visit that may be enough hydration to obtain lab work. She verbalized understanding and will try to schedule a lab visit this week in the morning. I advised her that I would be here at 7:45 am to attempt to draw labs again. Patient scheduled at 9:40am for lab visit. Labs placed back in future.

## 2022-10-04 NOTE — Progress Notes (Signed)
[facility] [address] [place] [address] Dept: [phone] Dept Fax: [phone]  Office Visit  Subjective:    Patient ID: Olivia Hayes, female    DOB: 02-04-1996, 27 y.o..   MRN: [id number]  Chief Complaint  Patient presents with   Back Pain    C/o having low back pain and hormonal outbreaks and wants STD testing.     History of Present Illness:  Patient is in today mainly to see about STD testing. She notes she and her husband, who she is legally separated form, continue to have intercourse at times. She is not trusting his word that he is remaining faithful to her. She has not had any recent vaginal discharge or pelvic pains.  Although she had recent low back pain, this resolved when she took ibuprofen. She does have some minor acne outbreak on her chin, but thinks this may be either a hormonal issue or related to her daughter sleeping with her. She is managing with azelaic acid and benzoyl peroxide that she gets over the counter.  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   Keratosis pilaris 11/18/2021   Stress fx pelvis 09/25/2021   Obesity (BMI 30.0-34.9) 08/26/2021   Aphthous ulcer of mouth 03/17/2021   Drug reaction 03/17/2021   History of ecchymosis 03/17/2021   Trichomoniasis 12/28/2020   Intrauterine device 12/22/2020   Seasonal allergic rhinitis 09/12/2020   Rectal bleeding 06/12/2020   History of DVT (deep vein thrombosis) 08/24/2019   Herpes simplex 10/15/2018   Inattention 01/06/2018   Globus sensation 07/11/2015   Acne 03/28/2013   Multiple allergies 02/21/2013   Past Surgical History:  Procedure Laterality Date   ABDOMINAL HERNIA REPAIR  age 60   INDUCED ABORTION     Family History  Problem Relation Age of Onset   Asthma Mother    Allergic rhinitis Mother    Thyroid disease Mother    Colon cancer Neg Hx    Pancreatic cancer Neg Hx     Esophageal cancer Neg Hx    Stomach cancer Neg Hx    Rectal cancer Neg Hx    Liver cancer Neg Hx    Outpatient Medications Prior to Visit  Medication Sig Dispense Refill   albuterol (VENTOLIN HFA) 108 (90 Base) MCG/ACT inhaler Inhale 2 puffs into the lungs every 6 (six) hours as needed for wheezing or shortness of breath. 8 g 0   diphenhydrAMINE (BENADRYL) 50 MG tablet Take 1 tablet (50 mg total) by mouth every 8 (eight) hours as needed for itching. 30 tablet 0   famotidine (PEPCID) 20 MG tablet Take 1 tablet (20 mg total) by mouth 2 (two) times daily. 30 tablet 0   levonorgestrel (MIRENA) 20 MCG/DAY IUD 1 each by Intrauterine route once.     mupirocin ointment (BACTROBAN) 2 % Apply 1 Application topically 2 (two) times daily. 22 g 0   No facility-administered medications prior to visit.   Allergies  Allergen Reactions   Peanuts [Peanut Oil] Anaphylaxis and Hives   Naproxen Hives     Objective:   Today's Vitals   10/04/22 1545  BP: 118/66  Pulse: 86  Temp: 98.2 F (36.8 C)  TempSrc: Temporal  SpO2: 98%  Weight: 168 lb 3.2 oz (76.3 kg)  Height: 5\' 2"  (1.575 m)   Body mass index is 30.76 kg/m.   General: Well developed, well nourished. No acute distress. Psych: Alert and oriented.  Normal mood and affect.  There are no preventive care reminders to display for this patient.    Assessment & Plan:   Problem List Items Addressed This Visit   None Visit Diagnoses     Screen for STD (sexually transmitted disease)    -  Primary   We will do STD testing today. We did discuss risk for PID related to her use of a Copper IUD. I support her caution.   Relevant Orders   Chlamydia/GC NAA, Confirmation   HIV Antibody (routine testing w rflx)   RPR       Return if symptoms worsen or fail to improve.   Haydee Salter, MD

## 2022-10-05 ENCOUNTER — Other Ambulatory Visit: Payer: BC Managed Care – PPO

## 2022-10-05 DIAGNOSIS — Z113 Encounter for screening for infections with a predominantly sexual mode of transmission: Secondary | ICD-10-CM

## 2022-10-06 LAB — RPR: RPR Ser Ql: NONREACTIVE

## 2022-10-06 LAB — HIV ANTIBODY (ROUTINE TESTING W REFLEX): HIV 1&2 Ab, 4th Generation: NONREACTIVE

## 2022-10-07 LAB — CHLAMYDIA/GC NAA, CONFIRMATION
Chlamydia trachomatis, NAA: NEGATIVE
Neisseria gonorrhoeae, NAA: NEGATIVE

## 2022-10-08 DIAGNOSIS — F4312 Post-traumatic stress disorder, chronic: Secondary | ICD-10-CM | POA: Diagnosis not present

## 2022-10-18 ENCOUNTER — Encounter: Payer: Self-pay | Admitting: *Deleted

## 2022-10-29 DIAGNOSIS — F4312 Post-traumatic stress disorder, chronic: Secondary | ICD-10-CM | POA: Diagnosis not present

## 2022-11-03 DIAGNOSIS — F4312 Post-traumatic stress disorder, chronic: Secondary | ICD-10-CM | POA: Diagnosis not present

## 2023-01-13 ENCOUNTER — Ambulatory Visit: Payer: BC Managed Care – PPO | Admitting: Internal Medicine

## 2023-01-13 ENCOUNTER — Encounter: Payer: Self-pay | Admitting: Internal Medicine

## 2023-01-13 ENCOUNTER — Ambulatory Visit: Payer: BC Managed Care – PPO | Admitting: Family Medicine

## 2023-01-13 ENCOUNTER — Ambulatory Visit (INDEPENDENT_AMBULATORY_CARE_PROVIDER_SITE_OTHER): Payer: BC Managed Care – PPO | Admitting: Internal Medicine

## 2023-01-13 VITALS — BP 106/70 | HR 74 | Temp 97.6°F | Ht 62.0 in | Wt 172.8 lb

## 2023-01-13 DIAGNOSIS — S0006XA Insect bite (nonvenomous) of scalp, initial encounter: Secondary | ICD-10-CM

## 2023-01-13 DIAGNOSIS — W57XXXA Bitten or stung by nonvenomous insect and other nonvenomous arthropods, initial encounter: Secondary | ICD-10-CM | POA: Diagnosis not present

## 2023-01-13 NOTE — Progress Notes (Signed)
[facility] [address] [place] [address] Dept: [phone] Dept Fax: [phone]  Acute Care Office Visit  Subjective:   Olivia Hayes [birth year] 01/13/2023  Chief Complaint  Patient presents with   Mass    Lump or knot on back of the neck around hair line noticed two days ago     HPI: The patient presents complaining of a knot on the back of her head/neck of her hairline onset approximately 2 days ago.  Patient states she had recently killed a large flying insect that day and is unsure if it may have bitten her.  She has used ibuprofen which has helped with the pain.  She denies any fever, chills, active drainage.       The following portions of the patient's history were reviewed and updated as appropriate: past medical history, past surgical history, family history, social history, allergies, medications, and problem list.   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   Keratosis pilaris 11/18/2021   Stress fx pelvis 09/25/2021   Obesity (BMI 30.0-34.9) 08/26/2021   Aphthous ulcer of mouth 03/17/2021   Drug reaction 03/17/2021   History of ecchymosis 03/17/2021   Trichomoniasis 12/28/2020   Intrauterine device 12/22/2020   Seasonal allergic rhinitis 09/12/2020   Rectal bleeding 06/12/2020   History of DVT (deep vein thrombosis) 08/24/2019   Herpes simplex 10/15/2018   Inattention 01/06/2018   Globus sensation 07/11/2015   Acne 03/28/2013   Multiple allergies 02/21/2013   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    Pelvic floor dysfunction    Urticaria    Yeast infection    Past Surgical History:  Procedure Laterality Date   ABDOMINAL HERNIA REPAIR  age 69   INDUCED  ABORTION     Family History  Problem Relation Age of Onset   Asthma Mother    Allergic rhinitis Mother    Thyroid disease Mother    Colon cancer Neg Hx    Pancreatic cancer Neg Hx    Esophageal cancer Neg Hx    Stomach cancer Neg Hx    Rectal cancer Neg Hx    Liver cancer Neg Hx    Outpatient Medications Prior to Visit  Medication Sig Dispense Refill   diphenhydrAMINE (BENADRYL) 50 MG tablet Take 1 tablet (50 mg total) by mouth every 8 (eight) hours as needed for itching. 30 tablet 0   levonorgestrel (MIRENA) 20 MCG/DAY IUD 1 each by Intrauterine route once.     mupirocin ointment (BACTROBAN) 2 % Apply 1 Application topically 2 (two) times daily. 22 g 0   albuterol (VENTOLIN HFA) 108 (90 Base) MCG/ACT inhaler Inhale 2 puffs into the lungs every 6 (six) hours as needed for wheezing or shortness of breath. (Patient not taking: Reported on 01/13/2023) 8 g 0   famotidine (PEPCID) 20 MG tablet Take 1 tablet (20 mg total) by mouth 2 (two) times daily. (Patient not taking: Reported on 01/13/2023) 30 tablet 0   No facility-administered medications prior to visit.   Allergies  Allergen Reactions   Peanuts [Peanut Oil] Anaphylaxis and Hives   Naproxen Hives     ROS: A complete ROS was performed with  pertinent positives/negatives noted in the HPI. The remainder of the ROS are negative.    Objective:   Today's Vitals   01/13/23 1105  BP: 106/70  Pulse: 74  Temp: 97.6 F (36.4 C)  TempSrc: Temporal  SpO2: 99%  Weight: 172 lb 12.8 oz (78.4 kg)  Height: 5\' 2"  (1.575 m)    GENERAL: Well-appearing, in NAD. Well nourished.  SKIN: Pink, warm and dry.  Single raised knot to left occipital area. no redness, drainage, or warmth. NECK: No lymphadenopathy.  RESPIRATORY: Respirations even and non-labored.  NEUROLOGIC: Steady, even gait.  PSYCH/MENTAL STATUS: Alert, oriented x 3. Cooperative, appropriate mood and affect.    No results found for any visits on 01/13/23.    Assessment &  Plan:  Assessment and Plan   1. Insect bite of scalp, initial encounter Continue ibuprofen every 8 hours as needed, can apply warm compresses as needed Educated patient on warning signs of infection and to return to clinic if such signs or symptoms appear      No orders of the defined types were placed in this encounter.  Lab Orders  No laboratory test(s) ordered today   No images are attached to the encounter or orders placed in the encounter.  Return if symptoms worsen or fail to improve.   Of note, portions of this note may have been created with voice recognition software Physicist, medical). While this note has been edited for accuracy, occasional wrong-word or 'sound-a-like' substitutions may have occurred due to the inherent limitations of voice recognition software.   Salvatore Decent, FNP

## 2023-01-13 NOTE — Patient Instructions (Signed)
Ibuprofen every 8 hours as needed for pain Warm compresses as needed

## 2023-03-09 ENCOUNTER — Encounter: Payer: Self-pay | Admitting: Family Medicine

## 2023-03-09 ENCOUNTER — Ambulatory Visit (INDEPENDENT_AMBULATORY_CARE_PROVIDER_SITE_OTHER): Payer: BC Managed Care – PPO | Admitting: Family Medicine

## 2023-03-09 VITALS — BP 110/66 | HR 100 | Temp 98.1°F | Ht 62.0 in | Wt 176.0 lb

## 2023-03-09 DIAGNOSIS — M25551 Pain in right hip: Secondary | ICD-10-CM | POA: Insufficient documentation

## 2023-03-09 DIAGNOSIS — U071 COVID-19: Secondary | ICD-10-CM | POA: Insufficient documentation

## 2023-03-09 LAB — POC COVID19 BINAXNOW: SARS Coronavirus 2 Ag: POSITIVE — AB

## 2023-03-09 LAB — UNLABELED: Test Ordered On Req: 111734418

## 2023-03-09 NOTE — Progress Notes (Signed)
[facility] [address] [place] [address] Dept: [phone] Dept Fax: [phone]  Office Visit  Subjective:    Patient ID: Olivia Hayes, female    DOB: [birth year], 27 y.o..   MRN: [id number]  Chief Complaint  Patient presents with   Sinus Problem    C/o having ST x 3 days, sneezing, runny nose, cough, and sinus pressure x 2 days.  No OTC meds taken.  No home covid test.     History of Present Illness:  Patient is in today with a 3-day history of sneezing, runny nose, headache, cough, sore throat, and fatigue. She has been drinking tea with honey. She has been trying to avoid other medicines at this point.   Olivia Hayes has a history of prior left hip pain that was felt to be due to a pelvic stress fracture. She notes the left hip issues resolved, but now she is having similar pain on the right. She was previously seen by Dr. Jordan Likes (Sports medicine). She asks about going back to be seen. She has had multiple joint pains involving the knees, hips, and back. She wonders about an arthritis cause.  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   Keratosis pilaris 11/18/2021   Stress fx pelvis 09/25/2021   Obesity (BMI 30.0-34.9) 08/26/2021   Aphthous ulcer of mouth 03/17/2021   Drug reaction 03/17/2021   History of ecchymosis 03/17/2021   Trichomoniasis 12/28/2020   Intrauterine device 12/22/2020   Seasonal allergic rhinitis 09/12/2020   Rectal bleeding 06/12/2020   History of DVT (deep vein thrombosis) 08/24/2019   Herpes simplex 10/15/2018   Inattention 01/06/2018   Globus sensation 07/11/2015   Acne 03/28/2013   Multiple allergies 02/21/2013   Past Surgical History:  Procedure Laterality Date   ABDOMINAL HERNIA REPAIR  age 27   INDUCED ABORTION     Family History  Problem Relation Age of Onset   Asthma Mother    Allergic rhinitis Mother    Thyroid  disease Mother    Colon cancer Neg Hx    Pancreatic cancer Neg Hx    Esophageal cancer Neg Hx    Stomach cancer Neg Hx    Rectal cancer Neg Hx    Liver cancer Neg Hx    Outpatient Medications Prior to Visit  Medication Sig Dispense Refill   cetirizine (ZYRTEC) 10 MG chewable tablet Chew 10 mg by mouth daily.     diphenhydrAMINE (BENADRYL) 50 MG tablet Take 1 tablet (50 mg total) by mouth every 8 (eight) hours as needed for itching. 30 tablet 0   levonorgestrel (MIRENA) 20 MCG/DAY IUD 1 each by Intrauterine route once. Cooper IUD     albuterol (VENTOLIN HFA) 108 (90 Base) MCG/ACT inhaler Inhale 2 puffs into the lungs every 6 (six) hours as needed for wheezing or shortness of breath. (Patient not taking: Reported on 01/13/2023) 8 g 0   famotidine (PEPCID) 20 MG tablet Take 1 tablet (20 mg total) by mouth 2 (two) times daily. (Patient not taking: Reported on 01/13/2023) 30 tablet 0   mupirocin ointment (BACTROBAN) 2 % Apply 1 Application topically 2 (two) times daily. (Patient not taking: Reported on 03/09/2023) 22 g 0   No facility-administered medications prior to visit.   Allergies  Allergen Reactions   Peanuts [Peanut Oil] Anaphylaxis and Hives   Naproxen Hives     Objective:   Today's Vitals   03/09/23 1611  BP: 110/66  Pulse: 100  Temp: 98.1 F (36.7 C)  TempSrc: Temporal  SpO2: 99%  Weight: 176 lb (79.8 kg)  Height: 5\' 2"  (1.575 m)   Body mass index is 32.19 kg/m.   General: Well developed, well nourished. No acute distress. HEENT: Normocephalic, non-traumatic. PERRL, EOMI. Conjunctiva clear. External ears normal.   EAC and TMs normal bilaterally. Nose clear without congestion or rhinorrhea. Mucous membranes   moist. Oropharynx clear. Good dentition. Neck: Supple. No lymphadenopathy. No thyromegaly. Lungs: Clear to auscultation bilaterally. No wheezing, rales or rhonchi. Psych: Alert and oriented. Normal mood and affect.  There are no preventive care reminders to  display for this patient.  Lab Results POCT Covid: Pos.    Assessment & Plan:   Problem List Items Addressed This Visit       Other   COVID-19 - Primary    Reviewed home care instructions for COVID, including rest, pushing fluids, and OTC medications as needed for symptom relief. Recommend hot tea with honey for sore throat symptoms. Advised self-isolation at home until 24 hours after fever resolved and symptoms improve. Continue to wear a mask around others for an additional 5 days. If symptoms, esp, dyspnea develops/worsens, recommend in-person evaluation at either an urgent care or the emergency room.       Relevant Orders   POC COVID-19 (Completed)   Right hip pain    At Olivia Hayes's request, I will refer her back to sports medicine. She could be at risk for recurrence of her stress fracture involving the opposite pelvis. I will check a CCP and RF, but am doubtful that her symptoms represent an autoimmune disease.      Relevant Orders   Cyclic citrul peptide antibody, IgG   Rheumatoid factor   Ambulatory referral to Sports Medicine    Return if symptoms worsen or fail to improve.   Loyola Mast, MD

## 2023-03-09 NOTE — Assessment & Plan Note (Signed)
Reviewed home care instructions for COVID, including rest, pushing fluids, and OTC medications as needed for symptom relief. Recommend hot tea with honey for sore throat symptoms. Advised self-isolation at home until 24 hours after fever resolved and symptoms improve. Continue to wear a mask around others for an additional 5 days. If symptoms, esp, dyspnea develops/worsens, recommend in-person evaluation at either an urgent care or the emergency room.

## 2023-03-09 NOTE — Assessment & Plan Note (Signed)
At Olivia Hayes's request, I will refer her back to sports medicine. She could be at risk for recurrence of her stress fracture involving the opposite pelvis. I will check a CCP and RF, but am doubtful that her symptoms represent an autoimmune disease.

## 2023-03-10 ENCOUNTER — Ambulatory Visit: Payer: BC Managed Care – PPO | Admitting: Family Medicine

## 2023-03-14 LAB — CYCLIC CITRUL PEPTIDE ANTIBODY, IGG: Cyclic Citrullin Peptide Ab: <16 U

## 2023-03-14 LAB — PAT ID TIQ DOC

## 2023-03-14 LAB — RHEUMATOID FACTOR: Rheumatoid fact SerPl-aCnc: <10 [IU]/mL (ref <14)

## 2023-03-14 NOTE — Progress Notes (Deleted)
    Olivia Hayes D[facility] [address] [place] [address] Phone: [phone]   Assessment and Plan:     There are no diagnoses linked to this encounter.  ***   Pertinent previous records reviewed include ***   Follow Up: ***     Subjective:    Chief Complaint: ***  HPI:   03/14/23 ***  Relevant Historical Information: ***  Additional pertinent review of systems negative.   Current Outpatient Medications:    cetirizine (ZYRTEC) 10 MG chewable tablet, Chew 10 mg by mouth daily., Disp: , Rfl:    diphenhydrAMINE (BENADRYL) 50 MG tablet, Take 1 tablet (50 mg total) by mouth every 8 (eight) hours as needed for itching., Disp: 30 tablet, Rfl: 0   levonorgestrel (MIRENA) 20 MCG/DAY IUD, 1 each by Intrauterine route once. Cooper IUD, Disp: , Rfl:    Objective:     There were no vitals filed for this visit.    There is no height or weight on file to calculate BMI.    Physical Exam:    ***   Electronically signed by:  Olivia Hayes [facility] 4:38 PM 03/14/23

## 2023-03-15 ENCOUNTER — Ambulatory Visit: Payer: BC Managed Care – PPO | Admitting: Sports Medicine

## 2023-03-16 ENCOUNTER — Ambulatory Visit (INDEPENDENT_AMBULATORY_CARE_PROVIDER_SITE_OTHER): Payer: BC Managed Care – PPO

## 2023-03-16 ENCOUNTER — Ambulatory Visit (INDEPENDENT_AMBULATORY_CARE_PROVIDER_SITE_OTHER): Payer: BC Managed Care – PPO | Admitting: Sports Medicine

## 2023-03-16 VITALS — BP 120/80 | HR 94 | Ht 62.0 in | Wt 176.0 lb

## 2023-03-16 DIAGNOSIS — M25551 Pain in right hip: Secondary | ICD-10-CM

## 2023-03-16 MED ORDER — MELOXICAM 15 MG PO TABS: 15.0000 mg | ORAL_TABLET | Freq: Every day | ORAL | 0 refills | Status: DC

## 2023-03-16 NOTE — Progress Notes (Signed)
Olivia Hayes [facility] [address] [place] [address] Phone: [phone]   Assessment and Plan:     1. 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  IT band and tensor fascia lata may also be involved.  Patient had similar pain a year ago on the left which resolved with relative rest and exercises, however has been more persistent over the past 7 to 8 months on the right side.  Potentially worsened by compensation - X-ray obtained in clinic.  My interpretation: No acute fracture or dislocation.  Small spurring at ASIS likely representing small enthesophytes at sartorius origin with history of bilateral ASIS pain and bilateral tight hip flexors. - Start meloxicam 15 mg daily x2 weeks.  If still having pain after 2 weeks, complete 3rd-week of meloxicam. May use remaining meloxicam as needed once daily for pain control.  Do not to use additional NSAIDs while taking meloxicam.  May use Tylenol [phone] mg 2 to 3 times a day for breakthrough pain. - Start HEP for hip and hip flexors - DG HIP UNILAT WITH PELVIS 2-3 VIEWS RIGHT; Future  15 additional minutes spent for educating Therapeutic Home Exercise Program.  This included exercises focusing on stretching, strengthening, with focus on eccentric aspects.   Long term goals include an improvement in range of motion, strength, endurance as well as avoiding reinjury. Patient's frequency would include in 1-2 times a day, 3-5 times a week for a duration of 6-12 weeks. Proper technique shown and discussed handout in great detail with ATC.  All questions were discussed and answered.     Pertinent previous records reviewed include left hip x-ray 08/26/2021, hip ultrasound 09/25/2021, sports medicine visit 10/30/2021   Follow Up: 4 weeks for reevaluation.  If  no improvement or worsening symptoms, could consider ultrasound versus physical therapy versus CSI   Subjective:   I, Olivia Hayes, am serving as a Neurosurgeon for Doctor Richardean Sale  Chief Complaint: right hip pain   HPI:   03/16/23 Patient is a 27 year old female complaining of right hip pain. Patient states hx of stress fx of left hip. She states the right side pain is the same if not worst. Pain for 7-8 months. Pt stretches have not helped. Intermittent tylenol for the pain. Pain radiates down the leg through the butt. No numbness or tingling. Random pain that feels like it is locked up. Decreased ROM   Relevant Historical Information: None pertinent  Additional pertinent review of systems negative.   Current Outpatient Medications:    cetirizine (ZYRTEC) 10 MG chewable tablet, Chew 10 mg by mouth daily., Disp: , Rfl:    diphenhydrAMINE (BENADRYL) 50 MG tablet, Take 1 tablet (50 mg total) by mouth every 8 (eight) hours as needed for itching., Disp: 30 tablet, Rfl: 0   levonorgestrel (MIRENA) 20 MCG/DAY IUD, 1 each by Intrauterine route once. Cooper IUD, Disp: , Rfl:    meloxicam (MOBIC) 15 MG tablet, Take 1 tablet (15 mg total) by mouth daily., Disp: 30 tablet, Rfl: 0   Objective:     Vitals:   03/16/23 1336  BP: 120/80  Pulse: 94  SpO2: 96%  Weight: 176 lb (79.8 kg)  Height: 5\' 2"  (1.575 m)      Body mass index is 32.19 kg/m.    Physical Exam:  General: awake, alert, and oriented no acute distress, nontoxic Skin: no suspicious lesions or rashes Neuro:sensation intact distally with no deficits, normal muscle tone, no atrophy, strength 5/5 in all tested lower ext groups Psych: normal mood and affect, speech clear   Right hip: No deformity, swelling or wasting ROM Flexion 90, ext 30, IR 45, ER 45 TTP right ASIS NTTP over the hip flexors, greater trochanter, gluteal musculature, si joint, lumbar spine Negative log roll with FROM Negative FABER Negative  FADIR Negative Piriformis test Negative trendelenberg Gait normal  Thomas test with 3 fingerbreadths under right leg and 2 fingerbreadths on her left leg  Electronically signed by:  Olivia Hayes [facility] 2:07 PM 03/16/23

## 2023-03-16 NOTE — Patient Instructions (Signed)
Hip HEP  - Start meloxicam 15 mg daily x2 weeks.  If still having pain after 2 weeks, complete 3rd-week of meloxicam. May use remaining meloxicam as needed once daily for pain control.  Do not to use additional NSAIDs while taking meloxicam.  May use Tylenol [phone] mg 2 to 3 times a day for breakthrough pain. 4 week follow up

## 2023-04-12 NOTE — Progress Notes (Deleted)
    Olivia Hayes [facility] [address] [place] [address] Phone: [phone]   Assessment and Plan:     There are no diagnoses linked to this encounter.  ***   Pertinent previous records reviewed include ***   Follow Up: ***     Subjective:   I, Olivia Hayes, am serving as a Neurosurgeon for Olivia Hayes   Chief Complaint: right hip pain    HPI:    03/16/23 Patient is a 27 year old female complaining of right hip pain. Patient states hx of stress fx of left hip. She states the right side pain is the same if not worst. Pain for 7-8 months. Pt stretches have not helped. Intermittent tylenol for the pain. Pain radiates down the leg through the butt. No numbness or tingling. Random pain that feels like it is locked up. Decreased ROM   04/13/2023 Patient states   Relevant Historical Information: None pertinent  Additional pertinent review of systems negative.   Current Outpatient Medications:    cetirizine (ZYRTEC) 10 MG chewable tablet, Chew 10 mg by mouth daily., Disp: , Rfl:    diphenhydrAMINE (BENADRYL) 50 MG tablet, Take 1 tablet (50 mg total) by mouth every 8 (eight) hours as needed for itching., Disp: 30 tablet, Rfl: 0   levonorgestrel (MIRENA) 20 MCG/DAY IUD, 1 each by Intrauterine route once. Cooper IUD, Disp: , Rfl:    meloxicam (MOBIC) 15 MG tablet, Take 1 tablet (15 mg total) by mouth daily., Disp: 30 tablet, Rfl: 0   Objective:     There were no vitals filed for this visit.    There is no height or weight on file to calculate BMI.    Physical Exam:    ***   Electronically signed by:  Olivia Hayes [facility] 8:23 AM 04/12/23

## 2023-04-13 ENCOUNTER — Ambulatory Visit: Payer: BC Managed Care – PPO | Admitting: Sports Medicine

## 2023-04-19 NOTE — Progress Notes (Deleted)
    Aleen Sells D.[facility] [address] [place] [address] Phone: [phone]   Assessment and Plan:     There are no diagnoses linked to this encounter.  ***   Pertinent previous records reviewed include ***   Follow Up: ***     Subjective:   I, Wilena Tyndall, am serving as a Neurosurgeon for Doctor Richardean Sale   Chief Complaint: right hip pain    HPI:    03/16/23 Patient is a 27 year old female complaining of right hip pain. Patient states hx of stress fx of left hip. She states the right side pain is the same if not worst. Pain for 7-8 months. Pt stretches have not helped. Intermittent tylenol for the pain. Pain radiates down the leg through the butt. No numbness or tingling. Random pain that feels like it is locked up. Decreased ROM   05/02/2023 Patient states   Relevant Historical Information: None pertinent  Additional pertinent review of systems negative.   Current Outpatient Medications:    cetirizine (ZYRTEC) 10 MG chewable tablet, Chew 10 mg by mouth daily., Disp: , Rfl:    diphenhydrAMINE (BENADRYL) 50 MG tablet, Take 1 tablet (50 mg total) by mouth every 8 (eight) hours as needed for itching., Disp: 30 tablet, Rfl: 0   levonorgestrel (MIRENA) 20 MCG/DAY IUD, 1 each by Intrauterine route once. Cooper IUD, Disp: , Rfl:    meloxicam (MOBIC) 15 MG tablet, Take 1 tablet (15 mg total) by mouth daily., Disp: 30 tablet, Rfl: 0   Objective:     There were no vitals filed for this visit.    There is no height or weight on file to calculate BMI.    Physical Exam:    ***   Electronically signed by:  Aleen Sells [facility] 8:27 AM 04/19/23

## 2023-05-02 ENCOUNTER — Ambulatory Visit: Payer: BC Managed Care – PPO | Admitting: Sports Medicine

## 2023-05-12 DIAGNOSIS — F4312 Post-traumatic stress disorder, chronic: Secondary | ICD-10-CM | POA: Diagnosis not present

## 2023-05-16 DIAGNOSIS — F4312 Post-traumatic stress disorder, chronic: Secondary | ICD-10-CM | POA: Diagnosis not present

## 2023-05-17 DIAGNOSIS — F4312 Post-traumatic stress disorder, chronic: Secondary | ICD-10-CM | POA: Diagnosis not present

## 2023-05-23 DIAGNOSIS — F4312 Post-traumatic stress disorder, chronic: Secondary | ICD-10-CM | POA: Diagnosis not present

## 2023-05-30 DIAGNOSIS — F4312 Post-traumatic stress disorder, chronic: Secondary | ICD-10-CM | POA: Diagnosis not present

## 2023-06-06 DIAGNOSIS — F4312 Post-traumatic stress disorder, chronic: Secondary | ICD-10-CM | POA: Diagnosis not present

## 2023-06-13 DIAGNOSIS — F4312 Post-traumatic stress disorder, chronic: Secondary | ICD-10-CM | POA: Diagnosis not present

## 2023-06-20 DIAGNOSIS — F4312 Post-traumatic stress disorder, chronic: Secondary | ICD-10-CM | POA: Diagnosis not present

## 2023-07-13 DIAGNOSIS — F4312 Post-traumatic stress disorder, chronic: Secondary | ICD-10-CM | POA: Diagnosis not present

## 2023-07-16 IMAGING — DX DG HIP (WITH OR WITHOUT PELVIS) 2-3V*L*
2 series · 2 of 2 positions shown · non-contrast
Comparison: None.

CLINICAL DATA: Left-sided hip pain

EXAM:
DG HIP (WITH OR WITHOUT PELVIS) 2-3V LEFT

[pelvis ap]
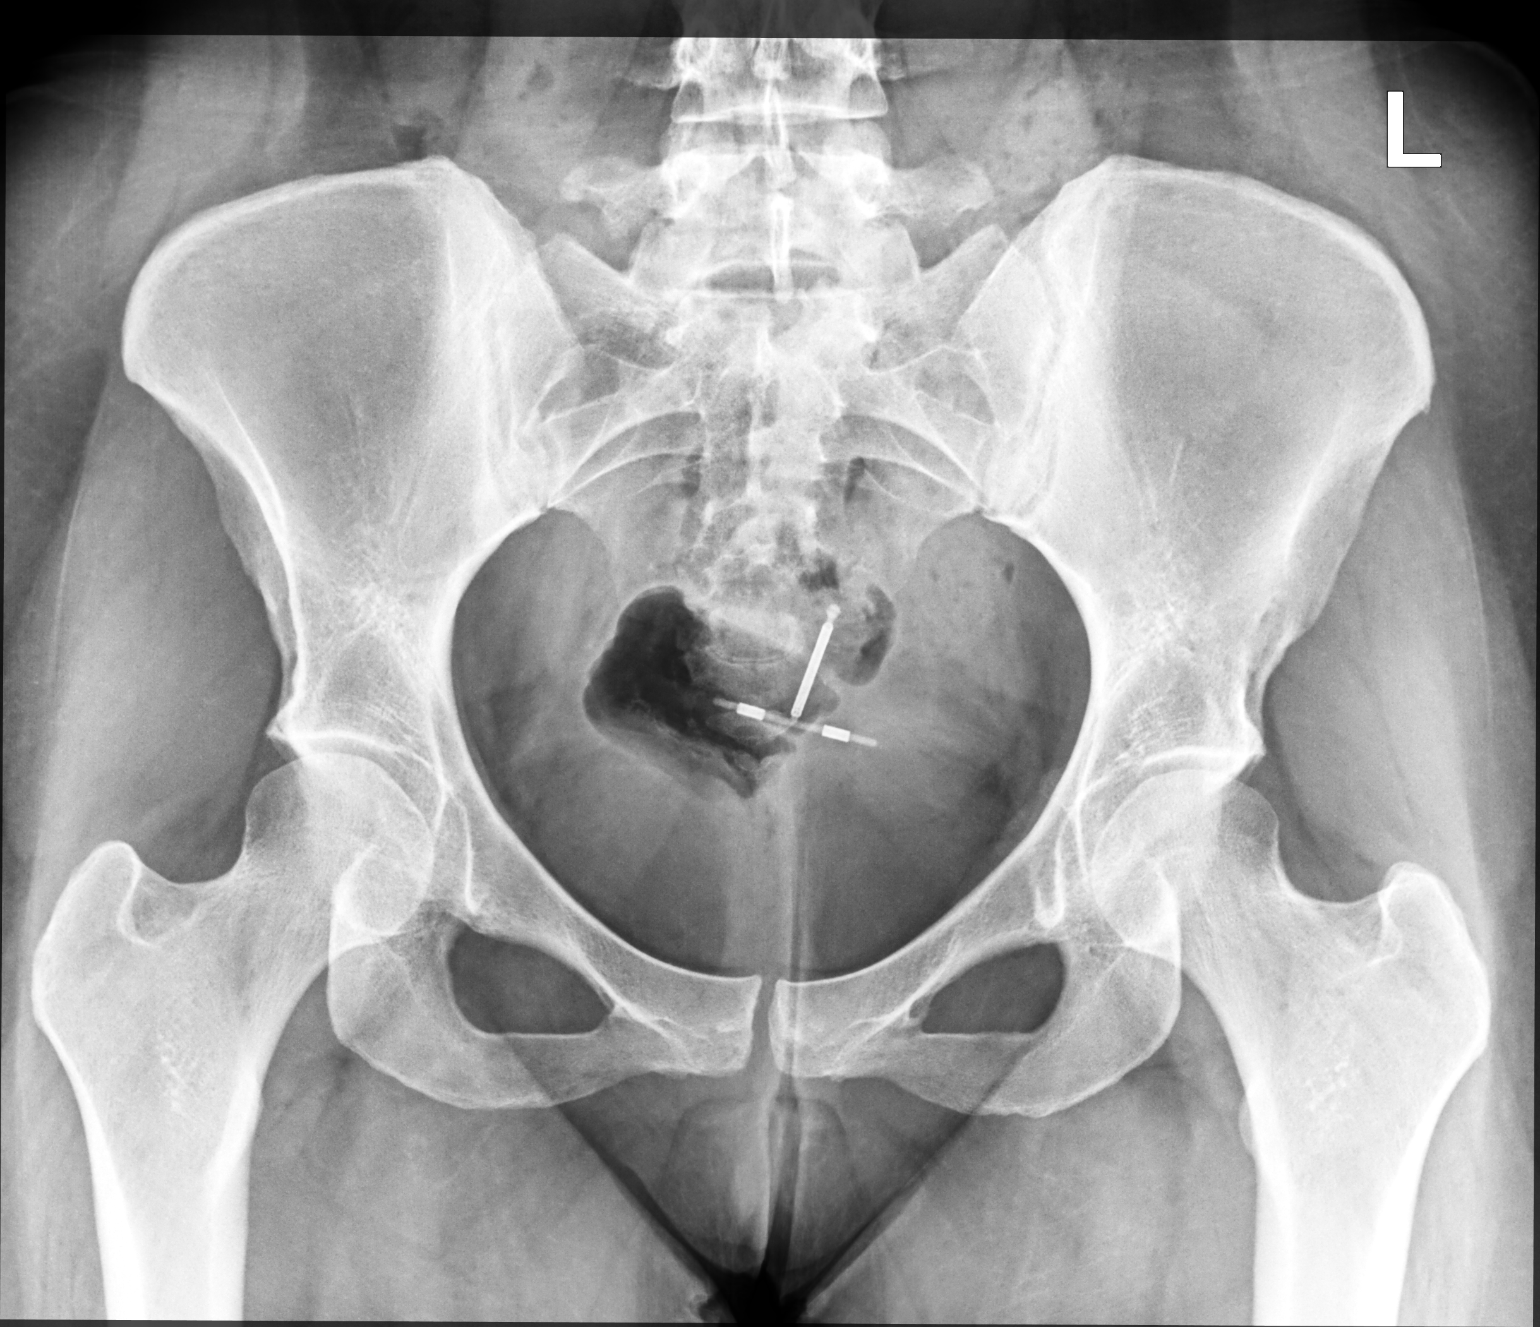

[hip frog leg lat]
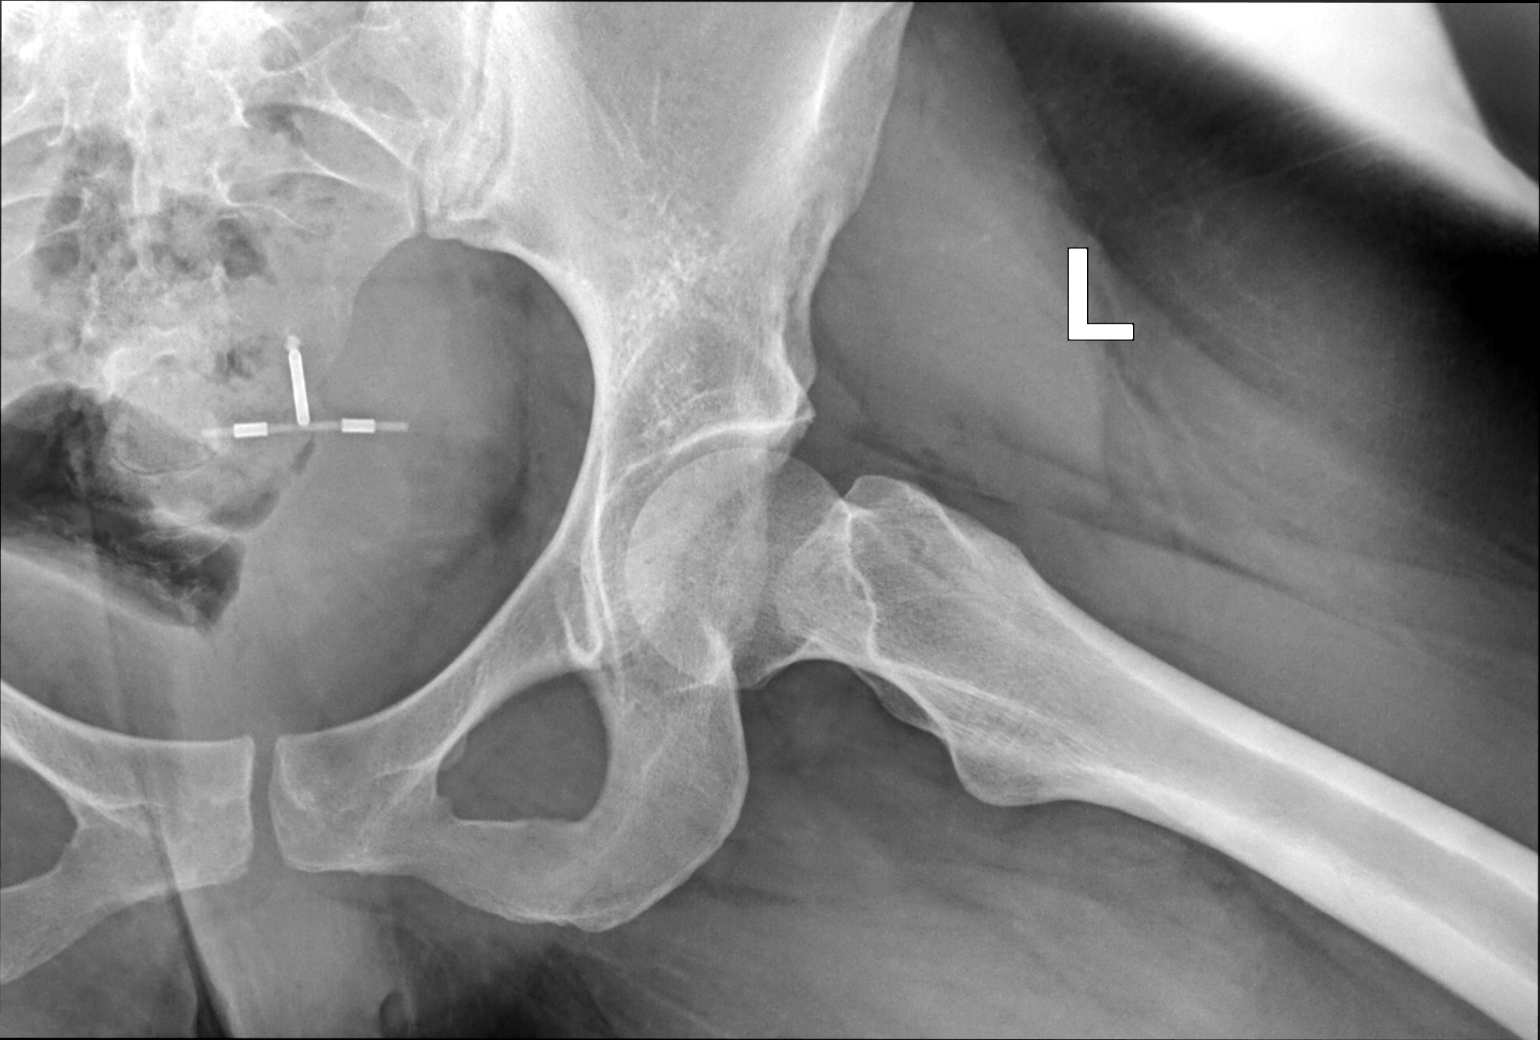

[2 of 2 positions shown; findings below may reference images not displayed]

FINDINGS: IUD in the pelvis. SI joints are patent. Pubic symphysis and rami
are intact. No fracture or malalignment. The joint spaces are
maintained
IMPRESSION: Negative.

## 2023-07-18 DIAGNOSIS — F4312 Post-traumatic stress disorder, chronic: Secondary | ICD-10-CM | POA: Diagnosis not present

## 2023-07-27 DIAGNOSIS — F4312 Post-traumatic stress disorder, chronic: Secondary | ICD-10-CM | POA: Diagnosis not present

## 2023-08-15 ENCOUNTER — Ambulatory Visit: Payer: Self-pay | Admitting: Family Medicine

## 2023-08-15 NOTE — Telephone Encounter (Signed)
  Chief Complaint: hearing loss, sounds like I'm under water Symptoms: muffled hearing Frequency: 5 days ago started Pertinent Negatives: Patient denies fever, denies drainage from ear  Disposition: [] ED /[] Urgent Care (no appt availability in office) / [x] Appointment(In office/virtual)/ []  [facility]/ [] Home Care/ [] Refused Recommended Disposition /[] [facility]/ []  Follow-up with PCP  Additional Notes: Was told by her dentist that her wisdom tooth area was infected.  Pt was rx'd abx and has been taking them as prescribed. Pt states that she suffered the loss of hearing and then picked up the abx. Pt states that she now feels like she is under water, her hearing is impacted. She states that there is a high pitch to it. Pt suffered the sudden change in hearing 5 days ago.    Copied from CRM [id number]. Topic: Clinical - Pink Word Triage >> Aug 15, 2023  9:41 AM Lennie Ra H wrote: Reason for Triage: patient is having pain in ear and hardly able hearing a high pitch ring and it sounds like underwater or muffled Reason for Disposition  [1] Decreased hearing in 1 ear AND [2] gradual onset  Answer Assessment - Initial Assessment Questions 1. DESCRIPTION: "What type of hearing problem are you having? Describe it for me." (e.g., complete hearing loss, partial loss)     L ear feels like it is underwater 2. LOCATION: "One or both ears?" If one, ask: "Which ear?"     L 3. SEVERITY: "Can you hear anything?" If Yes, ask: "What can you hear?" (e.g., ticking watch, whisper, talking)   - MILD:  Difficulty hearing soft speech, quiet library sounds, or speech from a distance or over background noise.   - MODERATE: Difficulty hearing normal speech even at closed distances.   - SEVERE: Unable to hear most normal conversation and talking; only able to hear loud sounds such as an alarm clock.     moderate 4. ONSET: "When did this begin?" "Did it start suddenly or come on gradually?"      sudden 5. PATTERN: "Does this come and go, or has it been constant since it started?"     constant 6. PAIN: "Is there any pain in your ear(s)?"  (Scale 1-10; or mild, moderate, severe)   - NONE (0): no pain   - MILD (1-3): doesn't interfere with normal activities    - MODERATE (4-7): interferes with normal activities or awakens from sleep    - SEVERE (8-10): excruciating pain, unable to do any normal activities      0 7. CAUSE: "What do you think is causing this hearing problem?"     unsure 8. OTHER SYMPTOMS: "Do you have any other symptoms?" (e.g., dizziness, ringing in ears)     Denies dizziness, high pitched ringing, comes and goes 9. PREGNANCY: "Is there any chance you are pregnant?" "When was your last menstrual period?"     denies  Protocols used: Hearing Loss or Change-A-AH

## 2023-08-15 NOTE — Telephone Encounter (Signed)
 Patient scheduled 08/16/23. Dm/cma

## 2023-08-16 ENCOUNTER — Ambulatory Visit: Payer: Medicaid Other | Admitting: Nurse Practitioner

## 2023-08-16 ENCOUNTER — Encounter: Payer: Self-pay | Admitting: Nurse Practitioner

## 2023-08-16 VITALS — BP 110/70 | HR 88 | Temp 97.0°F | Resp 18 | Wt 177.2 lb

## 2023-08-16 DIAGNOSIS — H6993 Unspecified Eustachian tube disorder, bilateral: Secondary | ICD-10-CM | POA: Diagnosis not present

## 2023-08-16 DIAGNOSIS — H6502 Acute serous otitis media, left ear: Secondary | ICD-10-CM

## 2023-08-16 MED ORDER — FLUTICASONE PROPIONATE 50 MCG/ACT NA SUSP: 2.0000 | Freq: Every day | NASAL | 0 refills | Status: DC

## 2023-08-16 MED ORDER — CLINDAMYCIN HCL 300 MG PO CAPS: 300.0000 mg | ORAL_CAPSULE | Freq: Three times a day (TID) | ORAL | 0 refills | Status: DC

## 2023-08-16 NOTE — Progress Notes (Signed)
Acute Office Visit  Subjective:    Patient ID: Olivia Hayes, female    DOB: [birth year], 28 y.o.   MRN: [id number]  Chief Complaint  Patient presents with   Acute Visit    PT C/O left ear hearing loss with no pain present; she is experiencing swooshing sounds for 1 week    Ear Fullness  There is pain in the left ear. This is a new problem. The current episode started in the past 7 days. The problem occurs constantly. The problem has been unchanged. There has been no fever. Associated symptoms include hearing loss. Pertinent negatives include no abdominal pain, coughing, diarrhea, ear discharge, headaches, neck pain, rash, rhinorrhea, sore throat or vomiting. Associated symptoms comments: Started on amoxicillin 500mg  TID 5days ago by dentist due to impacted wisdom tooth and gum swelling. She denies any GERD symptoms. She has tried antibiotics and NSAIDs for the symptoms. The treatment provided moderate relief. There is no history of a chronic ear infection, hearing loss or a tympanostomy tube. hx of recurrent ear infections as a child   Outpatient Medications Prior to Visit  Medication Sig   cetirizine (ZYRTEC) 10 MG chewable tablet Chew 10 mg by mouth daily.   diphenhydrAMINE (BENADRYL) 50 MG tablet Take 1 tablet (50 mg total) by mouth every 8 (eight) hours as needed for itching.   levonorgestrel (MIRENA) 20 MCG/DAY IUD 1 each by Intrauterine route once. Cooper IUD   [DISCONTINUED] amoxicillin (AMOXIL) 500 MG capsule Take by mouth every 8 (eight) hours.   meloxicam (MOBIC) 15 MG tablet Take 1 tablet (15 mg total) by mouth daily. (Patient not taking: Reported on 08/16/2023)   No facility-administered medications prior to visit.   Reviewed past medical and social history.  Review of Systems  HENT:  Positive for hearing loss. Negative for ear discharge, rhinorrhea and sore throat.   Respiratory:  Negative for cough.   Gastrointestinal:  Negative for abdominal pain, diarrhea and vomiting.   Musculoskeletal:  Negative for neck pain.  Skin:  Negative for rash.  Neurological:  Negative for headaches.   Per HPI     Objective:    Physical Exam Vitals and nursing note reviewed.  HENT:     Head: Normocephalic.     Right Ear: Ear canal and external ear normal. Decreased hearing noted. No drainage. A middle ear effusion is present. There is no impacted cerumen. No mastoid tenderness. Tympanic membrane is bulging. Tympanic membrane is not scarred, perforated or erythematous.     Left Ear: Ear canal and external ear normal. Decreased hearing noted. No drainage. A middle ear effusion is present. There is no impacted cerumen. No mastoid tenderness. Tympanic membrane is injected, erythematous and bulging. Tympanic membrane is not scarred or perforated.     Nose: No congestion or rhinorrhea.     Mouth/Throat:     Pharynx: No oropharyngeal exudate or posterior oropharyngeal erythema.  Cardiovascular:     Rate and Rhythm: Normal rate.     Pulses: Normal pulses.  Pulmonary:     Effort: Pulmonary effort is normal.  Musculoskeletal:     Cervical back: Normal range of motion and neck supple.  Lymphadenopathy:     Cervical: Cervical adenopathy present.  Neurological:     Mental Status: She is alert and oriented to person, place, and time.    BP 110/70 (BP Location: Left Arm, Patient Position: Sitting;Bed low/side rails up, Cuff Size: Large)   Pulse 88   Temp (!) 97 F (36.1 C) (Temporal)  Resp 18   Wt 177 lb 3.2 oz (80.4 kg)   SpO2 98%   BMI 32.41 kg/m    No results found for any visits on 08/16/23.     Assessment & Plan:   Problem List Items Addressed This Visit   None Visit Diagnoses       Non-recurrent acute serous otitis media of left ear    -  Primary   Relevant Medications   clindamycin (CLEOCIN) 300 MG capsule     Eustachian tube dysfunction, bilateral       Relevant Medications   fluticasone (FLONASE) 50 MCG/ACT nasal spray      Meds ordered this  encounter  Medications   fluticasone (FLONASE) 50 MCG/ACT nasal spray    Sig: Place 2 sprays into both nostrils daily.    Dispense:  16 g    Refill:  0    Supervising Provider:   Nadene Rubins ALFRED [[id number]]   clindamycin (CLEOCIN) 300 MG capsule    Sig: Take 1 capsule (300 mg total) by mouth 3 (three) times daily.    Dispense:  21 capsule    Refill:  0    D/c amoxicillin    Supervising Provider:   Mliss Sax [[id number]]   Return in about 10 days (around 08/26/2023) for ear pain and fullness with pcp.  Alysia Penna, NP

## 2023-08-29 ENCOUNTER — Other Ambulatory Visit (HOSPITAL_COMMUNITY)
Admission: RE | Admit: 2023-08-29 | Discharge: 2023-08-29 | Disposition: A | Discharge status: Home / Self Care | Payer: Medicaid Other | Source: Ambulatory Visit | Attending: Nurse Practitioner | Admitting: Nurse Practitioner

## 2023-08-29 ENCOUNTER — Ambulatory Visit: Payer: Medicaid Other | Admitting: Nurse Practitioner

## 2023-08-29 ENCOUNTER — Encounter: Payer: Self-pay | Admitting: Nurse Practitioner

## 2023-08-29 VITALS — BP 118/68 | HR 88 | Temp 98.2°F | Ht 64.0 in | Wt 175.8 lb

## 2023-08-29 DIAGNOSIS — N898 Other specified noninflammatory disorders of vagina: Secondary | ICD-10-CM | POA: Diagnosis present

## 2023-08-29 DIAGNOSIS — H6993 Unspecified Eustachian tube disorder, bilateral: Secondary | ICD-10-CM

## 2023-08-29 DIAGNOSIS — Z975 Presence of (intrauterine) contraceptive device: Secondary | ICD-10-CM | POA: Diagnosis not present

## 2023-08-29 MED ORDER — METHYLPREDNISOLONE 4 MG PO TBPK: ORAL_TABLET | ORAL | 0 refills | Status: DC

## 2023-08-29 NOTE — Progress Notes (Signed)
 Acute Office Visit  Subjective:    Patient ID: Olivia Hayes, female    DOB: Mar 10, 1996, 28 y.o.   MRN: [id number]  Chief Complaint  Patient presents with   Follow-up    Completed medication for ear pain and last menstrual period lasted longer and blood in discharge    HPI Patient is in today for  re eval of ear pain. She has completed clindamycin as prescribed. She has maintained use of flonase daily. She reports persistent popping sensation in ears, worse in left ear. She states her apartment possible has mold, but unable to move for another 2-27months. She also mentions intermittent vaginal discharge and odor. She has not been sexually active x 1year. She has a copper IUD which was inserted 43yrs ago. She is requesting for referring to GYN. She has intermittent cramp with menstrual cycle. No blood clots.  Outpatient Medications Prior to Visit  Medication Sig   cetirizine (ZYRTEC) 10 MG chewable tablet Chew 10 mg by mouth as needed for allergies or rhinitis.   fluticasone (FLONASE) 50 MCG/ACT nasal spray Place 2 sprays into both nostrils daily.   meloxicam (MOBIC) 15 MG tablet Take 1 tablet (15 mg total) by mouth daily.   paragard intrauterine copper IUD IUD 1 each by Intrauterine route once. Inserted 2022   [DISCONTINUED] levonorgestrel (MIRENA) 20 MCG/DAY IUD 1 each by Intrauterine route once. Cooper IUD   [DISCONTINUED] clindamycin (CLEOCIN) 300 MG capsule Take 1 capsule (300 mg total) by mouth 3 (three) times daily. (Patient not taking: Reported on 08/29/2023)   [DISCONTINUED] diphenhydrAMINE (BENADRYL) 50 MG tablet Take 1 tablet (50 mg total) by mouth every 8 (eight) hours as needed for itching. (Patient not taking: Reported on 08/29/2023)   No facility-administered medications prior to visit.    Reviewed past medical and social history.  Review of Systems Per HPI     Objective:    Physical Exam Vitals and nursing note reviewed.  HENT:     Right Ear: Ear canal and external  ear normal. No decreased hearing noted. A middle ear effusion is present. There is no impacted cerumen. Tympanic membrane is not injected, scarred, perforated, erythematous, retracted or bulging.     Left Ear: Ear canal and external ear normal. No decreased hearing noted. A middle ear effusion is present. There is no impacted cerumen. Tympanic membrane is not injected, scarred, perforated, erythematous, retracted or bulging.  Cardiovascular:     Rate and Rhythm: Normal rate and regular rhythm.     Pulses: Normal pulses.  Pulmonary:     Effort: Pulmonary effort is normal.  Musculoskeletal:     Cervical back: Normal range of motion and neck supple.  Lymphadenopathy:     Cervical: No cervical adenopathy.  Neurological:     Mental Status: She is alert.    BP 118/68 (BP Location: Left Arm, Patient Position: Sitting, Cuff Size: Normal)   Pulse 88   Temp 98.2 F (36.8 C) (Temporal)   Ht 5\' 4"  (1.626 m)   Wt 175 lb 12.8 oz (79.7 kg)   SpO2 99%   BMI 30.18 kg/m    No results found for any visits on 08/29/23.     Assessment & Plan:   Problem List Items Addressed This Visit     Intrauterine device   Relevant Orders   Ambulatory referral to Gynecology   Other Visit Diagnoses       Eustachian tube dysfunction, bilateral    -  Primary   Relevant Medications  methylPREDNISolone (MEDROL DOSEPAK) 4 MG TBPK tablet     Vaginal discharge       Relevant Orders   Cervicovaginal ancillary only( [place])      Meds ordered this encounter  Medications   methylPREDNISolone (MEDROL DOSEPAK) 4 MG TBPK tablet    Sig: Take as directed on package    Dispense:  21 tablet    Refill:  0    Supervising Provider:   Nadene Rubins ALFRED [[id number]]  Take zyrtec daily Hold flonase while taking oral prednisone Resume flonase after completion of oral prednisone. Advised to use air purifier at home if possible.  Return if symptoms worsen or fail to improve.    Alysia Penna, NP

## 2023-08-29 NOTE — Patient Instructions (Signed)
 Take zyrtec daily Hold flonase while taking oral prednisone Resume flonase after completion of oral prednisone.

## 2023-08-30 ENCOUNTER — Encounter: Payer: Self-pay | Admitting: Nurse Practitioner

## 2023-08-30 LAB — CERVICOVAGINAL ANCILLARY ONLY
Bacterial Vaginitis (gardnerella): NEGATIVE
Candida Glabrata: NEGATIVE
Candida Vaginitis: NEGATIVE
Comment: NEGATIVE
Comment: NEGATIVE
Comment: NEGATIVE

## 2023-09-07 ENCOUNTER — Other Ambulatory Visit: Payer: Self-pay | Admitting: Nurse Practitioner

## 2023-09-07 DIAGNOSIS — H6993 Unspecified Eustachian tube disorder, bilateral: Secondary | ICD-10-CM

## 2023-10-14 ENCOUNTER — Other Ambulatory Visit (HOSPITAL_COMMUNITY)
Admission: RE | Admit: 2023-10-14 | Discharge: 2023-10-14 | Disposition: A | Discharge status: Home / Self Care | Source: Ambulatory Visit | Attending: Family Medicine | Admitting: Family Medicine

## 2023-10-14 ENCOUNTER — Ambulatory Visit: Admitting: Family Medicine

## 2023-10-14 ENCOUNTER — Encounter: Payer: Self-pay | Admitting: Family Medicine

## 2023-10-14 VITALS — BP 120/68 | HR 98 | Temp 98.1°F | Ht 64.0 in | Wt 180.0 lb

## 2023-10-14 DIAGNOSIS — Z113 Encounter for screening for infections with a predominantly sexual mode of transmission: Secondary | ICD-10-CM

## 2023-10-14 DIAGNOSIS — M5431 Sciatica, right side: Secondary | ICD-10-CM | POA: Diagnosis not present

## 2023-10-14 NOTE — Progress Notes (Signed)
 [facility] [address] [place] [address] Dept: [phone] Dept Fax: [phone]  Office Visit  Subjective:    Patient ID: Olivia Hayes, female    DOB: 1996-04-22, 28 y.o..   MRN: [id number]  Chief Complaint  Patient presents with   Exposure to STD    Would like to STD testing.    History of Present Illness:  Patient is in today for requesting testing for STDs. Olivia Hayes notes that her divorce was finalized this winter. She has started dating again recently. She is being cautious. She has not had any abnormal vaginal discharge or odor. She has a past history of genital HSV-1 infection, but has not had any recent signs of vaginal sores.  Olivia Hayes notes she has some intermittent numbness int he lateral aspect of the right foot. She denies any recent alcohol use. She has a history of sciatica and has engaged in PT for this.  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   Keratosis pilaris 11/18/2021   Obesity (BMI 30.0-34.9) 08/26/2021   Aphthous ulcer of mouth 03/17/2021   Drug reaction 03/17/2021   History of ecchymosis 03/17/2021   Trichomoniasis 12/28/2020   Intrauterine device 12/22/2020   Seasonal allergic rhinitis 09/12/2020   Rectal bleeding 06/12/2020   History of DVT (deep vein thrombosis) 08/24/2019   Herpes simplex 10/15/2018   Inattention 01/06/2018   Globus sensation 07/11/2015   Acne 03/28/2013   Multiple allergies 02/21/2013   Past Surgical History:  Procedure Laterality Date   ABDOMINAL HERNIA REPAIR  age 57   INDUCED ABORTION     Family History  Problem Relation Age of Onset   Asthma Mother    Allergic rhinitis Mother    Thyroid disease Mother    Colon cancer Neg Hx    Pancreatic cancer Neg Hx    Esophageal cancer Neg Hx    Stomach cancer Neg Hx    Rectal cancer Neg Hx     Liver cancer Neg Hx    Outpatient Medications Prior to Visit  Medication Sig Dispense Refill   cetirizine (ZYRTEC) 10 MG chewable tablet Chew 10 mg by mouth as needed for allergies or rhinitis.     fluticasone (FLONASE) 50 MCG/ACT nasal spray SPRAY 2 SPRAYS INTO EACH NOSTRIL EVERY DAY 48 mL 1   paragard intrauterine copper IUD IUD 1 each by Intrauterine route once. Inserted 2022     meloxicam (MOBIC) 15 MG tablet Take 1 tablet (15 mg total) by mouth daily. 30 tablet 0   methylPREDNISolone (MEDROL DOSEPAK) 4 MG TBPK tablet Take as directed on package 21 tablet 0   No facility-administered medications prior to visit.   Allergies  Allergen Reactions   Peanuts [Peanut Oil] Anaphylaxis and Hives   Naproxen Hives     Objective:   Today's Vitals   10/14/23 1321  BP: 120/68  Pulse: 98  Temp: 98.1 F (36.7 C)  TempSrc: Temporal  SpO2: 96%  Weight: 180 lb (81.6 kg)  Height: 5\' 4"  (1.626 m)   Body mass index is 30.9 kg/m.   General: Well developed, well nourished. No acute distress. Psych: Alert and oriented. Normal mood and affect.  Health Maintenance Due  Topic Date Due   Pneumococcal Vaccine 53-67 Years old (1 of 2 - PCV) Never done     Assessment & Plan:   Problem List Items  Addressed This Visit   None Visit Diagnoses       Screen for STD (sexually transmitted disease)    -  Primary   Relevant Orders   HIV Antibody (routine testing w rflx)   RPR   Cervicovaginal ancillary only     Sciatica of right side       Numbness likely an aspect of sciatica. Continue PT efforts, regular movement, and consider self-massage.       Return if symptoms worsen or fail to improve.   Loyola Mast, MD

## 2023-10-15 LAB — RPR: RPR Ser Ql: NONREACTIVE

## 2023-10-15 LAB — HIV ANTIBODY (ROUTINE TESTING W REFLEX): HIV 1&2 Ab, 4th Generation: NONREACTIVE

## 2023-10-16 LAB — CERVICOVAGINAL ANCILLARY ONLY
Chlamydia: NEGATIVE
Comment: NEGATIVE
Comment: NORMAL
Neisseria Gonorrhea: NEGATIVE

## 2023-12-14 ENCOUNTER — Ambulatory Visit: Admitting: Nurse Practitioner

## 2023-12-14 ENCOUNTER — Ambulatory Visit: Payer: Self-pay

## 2023-12-14 ENCOUNTER — Encounter: Payer: Self-pay | Admitting: Nurse Practitioner

## 2023-12-14 VITALS — BP 102/60 | HR 104 | Temp 97.5°F | Ht 64.0 in | Wt 181.0 lb

## 2023-12-14 DIAGNOSIS — J3081 Allergic rhinitis due to animal (cat) (dog) hair and dander: Secondary | ICD-10-CM | POA: Insufficient documentation

## 2023-12-14 MED ORDER — FAMOTIDINE 20 MG PO TABS: 20.0000 mg | ORAL_TABLET | Freq: Every day | ORAL | 0 refills | Status: DC

## 2023-12-14 MED ORDER — DIPHENHYDRAMINE HCL 50 MG PO TABS: 50.0000 mg | ORAL_TABLET | Freq: Every evening | ORAL | 0 refills | Status: AC | PRN

## 2023-12-14 NOTE — Progress Notes (Signed)
 Acute Office Visit  Subjective:     Patient ID: Olivia Hayes, female    DOB: 21-Feb-1996, 28 y.o.   MRN: [id number]  Chief Complaint  Patient presents with   Allergies    Eyes swollen, itching    HPI Discussed the use of AI scribe software for clinical note transcription with the patient, who gave verbal consent to proceed.  History of Present Illness   Olivia Hayes is a 28 year old female who presents with severe allergic reactions after acquiring a cat.  She experiences swollen, puffy, and red eyes with persistent itching, frequent sneezing, slight wheezing, and significant nasal congestion. Ear itching occurs without pain, and generalized itching is present without visible rashes, though scratching leads to swelling and increased itching. Cetirizine  is ineffective, and she has not used Benadryl  due to an expired supply. She denies shortness of breath or trouble breathing, except for mild shortness of breath associated with sneezing. This all started after getting a cat, she knew she had an allergy , but didn't realized it was this bad.      ROS See pertinent positives and negatives per HPI.     Objective:    BP 102/60 (BP Location: Left Arm, Patient Position: Sitting, Cuff Size: Normal)   Pulse (!) 104   Temp (!) 97.5 F (36.4 C)   Ht 5' 4 (1.626 m)   Wt 181 lb (82.1 kg)   LMP 12/09/2023 (Approximate)   SpO2 98%   BMI 31.07 kg/m    Physical Exam Vitals and nursing note reviewed.  Constitutional:      General: She is not in acute distress.    Appearance: Normal appearance.  HENT:     Head: Normocephalic.     Right Ear: Tympanic membrane, ear canal and external ear normal.     Left Ear: Tympanic membrane, ear canal and external ear normal.     Nose: Rhinorrhea present.     Mouth/Throat:     Mouth: Mucous membranes are moist.     Pharynx: No posterior oropharyngeal erythema.  Eyes:     Conjunctiva/sclera: Conjunctivae normal.     Comments: Eye lids with  swelling and redness  Cardiovascular:     Rate and Rhythm: Normal rate and regular rhythm.     Pulses: Normal pulses.     Heart sounds: Normal heart sounds.  Pulmonary:     Effort: Pulmonary effort is normal.     Breath sounds: Normal breath sounds.  Musculoskeletal:     Cervical back: Normal range of motion.  Skin:    General: Skin is warm.  Neurological:     General: No focal deficit present.     Mental Status: She is alert and oriented to person, place, and time.  Psychiatric:        Mood and Affect: Mood normal.        Behavior: Behavior normal.        Thought Content: Thought content normal.        Judgment: Judgment normal.       Assessment & Plan:   Problem List Items Addressed This Visit       Respiratory   Allergic rhinitis due to animal hair and dander - Primary   Allergy  symptoms persist despite cetirizine , with symptoms of swollen, itchy eyes, sneezing, and nasal congestion after recently getting a cat. No wheezing or rashes. She prefers symptom management without removing the cat. Continue cetirizine  10 mg daily in the morning. Start Pepcid  20 mg daily  at lunch. Take Benadryl  25-50 mg at bedtime and as needed, noting drowsiness. Refer to an allergist for evaluation and allergy  shots. Consider prednisone  if symptoms, particularly eye swelling, do not improve.        Relevant Orders   Ambulatory referral to Allergy     Meds ordered this encounter  Medications   famotidine  (PEPCID ) 20 MG tablet    Sig: Take 1 tablet (20 mg total) by mouth daily.    Dispense:  30 tablet    Refill:  0   diphenhydrAMINE  (BENADRYL ) 50 MG tablet    Sig: Take 1 tablet (50 mg total) by mouth at bedtime as needed for itching or allergies.    Dispense:  30 tablet    Refill:  0    Return if symptoms worsen or fail to improve.  Olivia Benjamin, NP

## 2023-12-14 NOTE — Telephone Encounter (Signed)
 FYI Only or Action Required?: FYI only for provider  Patient was last seen in primary care on 10/14/2023 by Graig Lawyer, MD. Called Nurse Triage reporting Pruritis. Symptoms began 2-3 weeks and worsening. Interventions attempted: OTC medications: zyrtec . Symptoms are: gradually worsening.  Triage Disposition: See Physician Within 24 Hours  Patient/caregiver understands and will follow disposition?: Yes   Copied from CRM [id number]. Topic: Clinical - Red Word Triage >> Dec 14, 2023 10:15 AM Luane Rumps D wrote: Red Word that prompted transfer to Nurse Triage: Allergic reaction to cat, eyes are nearly swollen shut. Constant sneezing and itching throat, took over the counter allergy  med but it has not been helpful. Reason for Disposition  [1] MODERATE-SEVERE widespread itching (i.e., interferes with sleep, normal activities or school) AND [2] not improved after 24 hours of itching Care Advice  Answer Assessment - Initial Assessment Questions 1. DESCRIPTION: Describe the itching you are having.     Itching to bilateral eyes, throat. Bilateral redness and swelling 2. SEVERITY: How bad is it?    - MILD: Doesn't interfere with normal activities.   - MODERATE-SEVERE: Interferes with work, school, sleep, or other activities.      moderate 3. SCRATCHING: Are there any scratch marks? Bleeding?     no 4. ONSET: When did this begin?  X 2-3 weeks and worsening   5. CAUSE: What do you think is causing the itching? (ask about swimming pools, pollen, animals, soaps, etc.)    cat 6. OTHER SYMPTOMS: Do you have any other symptoms?      Sneezing, watery eyes 7. PREGNANCY: Is there any chance you are pregnant? When was your last menstrual period?     N/a  Pt thinks she maybe allergic to the cat she bought her daughter  Protocols used: Itching - [employer]

## 2023-12-14 NOTE — Assessment & Plan Note (Signed)
 Allergy  symptoms persist despite cetirizine , with symptoms of swollen, itchy eyes, sneezing, and nasal congestion after recently getting a cat. No wheezing or rashes. She prefers symptom management without removing the cat. Continue cetirizine  10 mg daily in the morning. Start Pepcid  20 mg daily at lunch. Take Benadryl  25-50 mg at bedtime and as needed, noting drowsiness. Refer to an allergist for evaluation and allergy  shots. Consider prednisone  if symptoms, particularly eye swelling, do not improve.

## 2023-12-14 NOTE — Telephone Encounter (Signed)
 Noted. Patient scheduled for an appointment today with Lauren.

## 2023-12-14 NOTE — Patient Instructions (Signed)
 It was great to see you!  Start cetirizine  1 tablet daily in the morning  Start pepcid  1 tablet daily at lunch  Take benadryl  at bedtime and every 6-8 hours this may make you sleepy  I have placed a referral to an allergist  Let's follow-up if your symptoms worsen  Take care,  Rheba Cedar, NP

## 2023-12-27 ENCOUNTER — Encounter: Admitting: Family Medicine

## 2024-01-06 ENCOUNTER — Other Ambulatory Visit: Payer: Self-pay | Admitting: Nurse Practitioner

## 2024-01-10 ENCOUNTER — Ambulatory Visit: Payer: Self-pay | Admitting: Family Medicine

## 2024-01-10 ENCOUNTER — Ambulatory Visit (INDEPENDENT_AMBULATORY_CARE_PROVIDER_SITE_OTHER): Admitting: Family Medicine

## 2024-01-10 ENCOUNTER — Encounter: Payer: Self-pay | Admitting: Family Medicine

## 2024-01-10 VITALS — BP 110/66 | HR 85 | Temp 97.4°F | Ht 64.0 in | Wt 179.6 lb

## 2024-01-10 DIAGNOSIS — E785 Hyperlipidemia, unspecified: Secondary | ICD-10-CM | POA: Insufficient documentation

## 2024-01-10 DIAGNOSIS — E669 Obesity, unspecified: Secondary | ICD-10-CM | POA: Diagnosis not present

## 2024-01-10 DIAGNOSIS — E66811 Obesity, class 1: Secondary | ICD-10-CM

## 2024-01-10 DIAGNOSIS — Z6830 Body mass index (BMI) 30.0-30.9, adult: Secondary | ICD-10-CM

## 2024-01-10 DIAGNOSIS — Z0000 Encounter for general adult medical examination without abnormal findings: Secondary | ICD-10-CM | POA: Diagnosis not present

## 2024-01-10 LAB — BASIC METABOLIC PANEL WITH GFR
BUN: 13 mg/dL (ref 6–23)
CO2: 24 meq/L (ref 19–32)
Calcium: 8.8 mg/dL (ref 8.4–10.5)
Chloride: 104 meq/L (ref 96–112)
Creatinine, Ser: 0.76 mg/dL (ref 0.40–1.20)
GFR: 107.34 mL/min (ref >60.00)
Glucose, Bld: 66 mg/dL — ABNORMAL LOW (ref 70–99)
Potassium: 4 meq/L (ref 3.5–5.1)
Sodium: 138 meq/L (ref 135–145)

## 2024-01-10 LAB — LIPID PANEL
Cholesterol: 210 mg/dL — ABNORMAL HIGH (ref 0–200)
HDL: 54.7 mg/dL (ref >39.00)
LDL Cholesterol: 122 mg/dL — ABNORMAL HIGH (ref 0–99)
NonHDL: 155.15
Total CHOL/HDL Ratio: 4
Triglycerides: 165 mg/dL — ABNORMAL HIGH (ref 0.0–149.0)
VLDL: 33 mg/dL (ref 0.0–40.0)

## 2024-01-10 LAB — HEMOGLOBIN A1C: Hgb A1c MFr Bld: 5.6 % (ref 4.6–6.5)

## 2024-01-10 LAB — TSH: TSH: 1.04 u[IU]/mL (ref 0.35–5.50)

## 2024-01-10 NOTE — Assessment & Plan Note (Addendum)
 Overall health is excellent. Recommend ongoing regular exercise. Discussed recommended screenings and immunizations. Follow-up with GYN regarding pap smear screening.

## 2024-01-10 NOTE — Progress Notes (Signed)
 [facility] [address] [place] [address] Dept: [phone] Dept Fax: [phone]  Annual Physical Visit  Subjective:    Patient ID: Olivia Hayes, female    DOB: 03-22-1996, 28 y.o..   MRN: [id number]  Chief Complaint  Patient presents with   Annual Exam    CPE/labs.     History of Present Illness:  Patient is in today for an annual physical/preventative visit.  Review of Systems  Constitutional:  Negative for chills, diaphoresis, fever, malaise/fatigue and weight loss.       Ms. Bowlds has concerns about inability to lose weight. She follows a healthy diet and exercises regularly. She was able to cut calories more steeply and lost weight, but others told her this was unhealthy. Her psychiatrist has recently prescribed lamotrigine to block cortisol. She was told she might have a thyroid  condition. She denies any cold intolerance, hair loss, or constipation. She does note brittle nails. She has a family history of hypothyroidism.  HENT:  Negative for congestion, ear pain, hearing loss, sinus pain, sore throat and tinnitus.   Eyes:  Negative for blurred vision, pain, discharge and redness.  Respiratory:  Negative for cough, shortness of breath and wheezing.   Cardiovascular:  Negative for chest pain and palpitations.  Gastrointestinal:  Negative for abdominal pain, constipation, diarrhea, heartburn, nausea and vomiting.  Musculoskeletal:  Negative for back pain, joint pain and myalgias.  Skin:  Negative for itching and rash.  Psychiatric/Behavioral:  Negative for depression. The patient is not nervous/anxious.    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   Inattention 01/06/2018   Globus sensation 07/11/2015   Acne 03/28/2013   Multiple allergies 02/21/2013   Past Surgical History:  Procedure Laterality Date   ABDOMINAL HERNIA REPAIR  age 66   INDUCED ABORTION     Family History  Problem Relation Age of Onset   Asthma Mother    Allergic rhinitis Mother    Thyroid  disease Mother    Colon cancer Neg Hx    Pancreatic cancer Neg Hx    Esophageal cancer Neg Hx    Stomach cancer Neg Hx    Rectal cancer Neg Hx    Liver cancer Neg Hx    Outpatient Medications Prior to Visit  Medication Sig Dispense Refill   cetirizine  (ZYRTEC ) 10 MG chewable tablet Chew 10 mg by mouth as needed for allergies or rhinitis.     diphenhydrAMINE  (BENADRYL ) 50 MG tablet Take 1 tablet (50 mg total) by mouth at bedtime as needed for itching or allergies. 30 tablet 0   fluticasone  (FLONASE ) 50 MCG/ACT nasal spray SPRAY 2 SPRAYS INTO EACH NOSTRIL EVERY DAY 48 mL 1   lamoTRIgine (LAMICTAL) 25 MG tablet Take 25 mg by mouth daily.     paragard intrauterine copper IUD IUD 1 each by Intrauterine route once. Inserted 2022     famotidine  (PEPCID ) 20 MG tablet  Take 1 tablet (20 mg total) by mouth daily. (Patient not taking: Reported on 01/10/2024) 30 tablet 0   No facility-administered medications prior to visit.   Allergies  Allergen Reactions   Peanuts [Peanut  Oil] Anaphylaxis and Hives   Naproxen  Hives   Objective:   Today's Vitals   01/10/24 1037  BP: 110/66  Pulse: 85  Temp: (!) 97.4 F (36.3 C)  TempSrc: Temporal  SpO2: 98%  Weight: 179 lb 9.6 oz (81.5 kg)  Height: 5' 4 (1.626 m)   Body mass index is 30.83 kg/m.   General: Well developed,  well nourished. No acute distress. HEENT: Normocephalic, non-traumatic. PERRL, EOMI. Conjunctiva clear. External ears normal. EAC and TMs normal   bilaterally. Nose clear without congestion or rhinorrhea. Mucous membranes moist. Oropharynx clear. Good dentition. Neck: Supple. No lymphadenopathy. No thyromegaly. Lungs: Clear to auscultation bilaterally. No wheezing, rales or rhonchi. CV: RRR without murmurs or rubs. Pulses 2+ bilaterally. Abdomen: Soft, non-tender. Bowel sounds positive, normal pitch and frequency. No hepatosplenomegaly. No rebound   or guarding. Extremities: Full ROM. No joint swelling or tenderness. No edema noted. Skin: Warm and dry. No rashes. Psych: Alert and oriented. Normal mood and affect.  Health Maintenance Due  Topic Date Due   Pneumococcal Vaccine 1-55 Years old (1 of 2 - PCV) Never done   Hepatitis B Vaccines (1 of 3 - 19+ 3-dose series) Never done   Cervical Cancer Screening (Pap smear)  12/26/2023     Assessment & Plan:   Problem List Items Addressed This Visit       Other   Annual physical exam - Primary   Overall health is excellent. Recommend ongoing regular exercise. Discussed recommended screenings and immunizations.       Obesity (BMI 30.0-34.9)   Discussed principles of weight management, including a foundation of a healthy, calorie-controlled diet and regular exercise. BMR is ~ 1500 cal. I recommend she continue a mix of cardio and strength training. We did discuss the role of medication to augment dietary and exercise efforts and potential approaches to accessing affordable options. She should check with her insurance about what may be covered.       Relevant Orders   Lipid panel   TSH   Hemoglobin A1c   Basic metabolic panel with GFR    Return in about 1 year (around 01/09/2025) for Annual preventative care.   Garnette CHRISTELLA Simpler, MD

## 2024-01-10 NOTE — Assessment & Plan Note (Addendum)
 Discussed principles of weight management, including a foundation of a healthy, calorie-controlled diet and regular exercise. BMR is ~ 1500 cal. I recommend she continue a mix of cardio and strength training. We did discuss the role of medication to augment dietary and exercise efforts and potential approaches to accessing affordable options. She should check with her insurance about what may be covered.

## 2024-01-23 ENCOUNTER — Telehealth: Payer: Self-pay

## 2024-01-23 NOTE — Telephone Encounter (Signed)
 Called patient to schedule new patient appointment. Left voicemail with our contact information to call back and schedule.

## 2024-02-09 ENCOUNTER — Ambulatory Visit: Payer: Self-pay | Admitting: Internal Medicine

## 2024-02-28 ENCOUNTER — Encounter: Payer: Self-pay | Admitting: Internal Medicine

## 2024-02-28 ENCOUNTER — Other Ambulatory Visit (HOSPITAL_COMMUNITY)
Admission: RE | Admit: 2024-02-28 | Discharge: 2024-02-28 | Disposition: A | Discharge status: Home / Self Care | Source: Ambulatory Visit | Attending: Internal Medicine | Admitting: Internal Medicine

## 2024-02-28 ENCOUNTER — Ambulatory Visit: Admitting: Internal Medicine

## 2024-02-28 VITALS — BP 106/68 | HR 83 | Temp 98.1°F | Ht 64.0 in | Wt 181.4 lb

## 2024-02-28 DIAGNOSIS — Z113 Encounter for screening for infections with a predominantly sexual mode of transmission: Secondary | ICD-10-CM

## 2024-02-28 LAB — POCT PREGNANCY, URINE

## 2024-02-28 NOTE — Progress Notes (Signed)
 [facility] [address] [place] [address] Dept: [phone] Dept Fax: [phone]  Acute Care Office Visit  Subjective:   [name] Dec 16, 1995 02/28/2024  No chief complaint on file.   HPI:  Discussed the use of AI scribe software for clinical note transcription with the patient, who gave verbal consent to proceed.  History of Present Illness   Olivia Hayes is a 28 year old female who presents for STD screening after a recent encounter with a new female partner.  She has no current symptoms such as vaginal itching, abnormal discharge, or foul odor. She experienced some internal soreness the day after the encounter. There is slight irritation when urinating, which she associates with her menstrual cycle that is due to start in three days.  She uses a ParaGard copper IUD for contraception and reports no irregular vaginal bleeding. She did not use protection during the recent encounter, which occurred two weeks ago.  She has not noticed any sores or lesions and typically experiences some itching due to shaving, which she does not consider unusual. She is concerned about potential STDs due to the new partner being in the military and often away, leading to her decision to seek screening.    The following portions of the patient's history were reviewed and updated as appropriate: past medical history, past surgical history, family history, social history, allergies, medications, and problem list.   Patient Active Problem List   Diagnosis Date Noted   Annual physical exam 01/10/2024   Borderline hyperlipidemia 01/10/2024   Allergic rhinitis due to animal hair and dander 12/14/2023   COVID-19 03/09/2023   Right hip pain 03/09/2023   Urticaria 02/12/2022   Keratosis pilaris 11/18/2021   Obesity (BMI 30.0-34.9) 08/26/2021   Aphthous ulcer of mouth 03/17/2021   Drug reaction 03/17/2021   History of ecchymosis  03/17/2021   Trichomoniasis 12/28/2020   Intrauterine device 12/22/2020   Seasonal allergic rhinitis 09/12/2020   Rectal bleeding 06/12/2020   History of DVT (deep vein thrombosis) 08/24/2019   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   Inattention 01/06/2018   Globus sensation 07/11/2015   Acne 03/28/2013   Multiple allergies 02/21/2013   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    Pelvic floor dysfunction    Urticaria    Yeast infection    Past Surgical History:  Procedure Laterality Date   ABDOMINAL HERNIA REPAIR  age 48   INDUCED ABORTION     Family History  Problem Relation Age of Onset   Asthma Mother    Allergic rhinitis Mother    Thyroid  disease Mother    Colon cancer Neg Hx    Pancreatic cancer Neg Hx    Esophageal cancer Neg Hx    Stomach cancer Neg Hx    Rectal cancer Neg Hx    Liver cancer Neg Hx     Current Outpatient Medications:    cetirizine  (ZYRTEC ) 10 MG chewable tablet, Chew 10 mg by mouth as needed for allergies or rhinitis., Disp: , Rfl:    diphenhydrAMINE  (BENADRYL ) 50 MG tablet, Take 1 tablet (50 mg total) by mouth at bedtime as needed for itching or allergies., Disp: 30 tablet, Rfl: 0  fluticasone  (FLONASE ) 50 MCG/ACT nasal spray, SPRAY 2 SPRAYS INTO EACH NOSTRIL EVERY DAY, Disp: 48 mL, Rfl: 1   paragard intrauterine copper IUD IUD, 1 each by Intrauterine route once. Inserted 2022, Disp: , Rfl:    lamoTRIgine (LAMICTAL) 25 MG tablet, Take 25 mg by mouth daily. (Patient not taking: Reported on 02/28/2024), Disp: , Rfl:  Allergies  Allergen Reactions   Peanuts [Peanut  Oil] Anaphylaxis and Hives   Naproxen  Hives     ROS: A complete ROS was performed with pertinent positives/negatives noted in the HPI. The remainder of the ROS are negative.     Objective:   Today's Vitals   02/28/24 1332  BP: 106/68  Pulse: 83  Temp: 98.1 F (36.7 C)  TempSrc: Temporal  SpO2: 99%  Weight: 181 lb 6.4 oz (82.3 kg)  Height: 5' 4 (1.626 m)    GENERAL: Well-appearing, in NAD. Well nourished.  SKIN: Pink, warm and dry. No rash, lesion, ulceration, or ecchymoses.  NECK: Trachea midline. Full ROM w/o pain or tenderness. No lymphadenopathy.  RESPIRATORY: Chest wall symmetrical. Respirations even and non-labored. Breath sounds clear to auscultation bilaterally.  CARDIAC: S1, S2 present, regular rate and rhythm. Peripheral pulses 2+ bilaterally.  EXTREMITIES: Without clubbing, cyanosis, or edema.  NEUROLOGIC:  Steady, even gait.  PSYCH/MENTAL STATUS: Alert, oriented x 3. Cooperative, appropriate mood and affect.    Results for orders placed or performed in visit on 02/28/24  POCT Pregnancy, Urine  Result Value Ref Range   Negative        Assessment & Plan:  Assessment and Plan    Screening for sexually transmitted infections (STIs) Asymptomatic, seeking STI screening post-unprotected intercourse. Aware herpes is not routinely screened without sores due to possibility false positive blood results.  Discussed untreated STIs' impact on fertility. - Perform swab test for trichomonas, gonorrhea, chlamydia, yeast, and bacterial vaginosis. - Conduct blood work for HIV, hepatitis C, and syphilis. - Advise monitoring for sores and return if appear. - Discuss low pregnancy likelihood due to IUD, advise retesting if menstruation delayed. - Provide OB GYN follow-up referral information.     Orders Placed This Encounter  Procedures   RPR   HIV antibody (with reflex)   Hepatitis C antibody   Lab Orders         RPR         HIV antibody (with reflex)         Hepatitis C antibody     No images are attached to the encounter or orders placed in the encounter.  Return if symptoms worsen or fail to improve.   Rosina Senters, FNP

## 2024-02-28 NOTE — Patient Instructions (Addendum)
 [facility] [address] [place], [place] [address] [phone]   VISIT SUMMARY: You came in today for a sexually transmitted infection (STI) screening after a recent encounter with a new partner. You have no current symptoms but experienced some internal soreness and slight irritation when urinating, which you believe is related to your upcoming menstrual cycle. You use a ParaGard copper IUD for contraception and have had a recent negative pregnancy test. You did not use protection during the recent encounter, which occurred two weeks ago. You are concerned about potential STIs due to your partner's military status and frequent absences.  YOUR PLAN: -SCREENING FOR SEXUALLY TRANSMITTED INFECTIONS (STIS): You are asymptomatic but seeking STI screening after unprotected intercourse. STIs can affect fertility if left untreated. We performed a swab test for trichomonas, gonorrhea, chlamydia, yeast, and bacterial vaginosis, and conducted blood work for HIV, hepatitis C, and syphilis. You will receive the swab test results within 24-48 hours and the blood work results by the end of the week. Please monitor for any sores and return if they appear. The likelihood of pregnancy is low due to your IUD, but retest if your menstruation is delayed. We have provided you with OB GYN follow-up referral information.  INSTRUCTIONS: You will receive the swab test results within 24-48 hours and the blood work results by the end of the week. Please monitor for any sores and return if they appear. Retest for pregnancy if your menstruation is delayed. Follow up with the [facility] GYN referral information provided.                      Contains text generated by Abridge.                                 Contains text generated by Abridge.

## 2024-02-29 ENCOUNTER — Ambulatory Visit: Admitting: Nurse Practitioner

## 2024-03-01 ENCOUNTER — Ambulatory Visit: Payer: Self-pay | Admitting: Internal Medicine

## 2024-03-01 ENCOUNTER — Encounter: Payer: Self-pay | Admitting: Internal Medicine

## 2024-03-01 DIAGNOSIS — B9689 Other specified bacterial agents as the cause of diseases classified elsewhere: Secondary | ICD-10-CM

## 2024-03-01 LAB — CERVICOVAGINAL ANCILLARY ONLY
Bacterial Vaginitis (gardnerella): POSITIVE — AB
Candida Glabrata: NEGATIVE
Candida Vaginitis: NEGATIVE
Chlamydia: NEGATIVE
Comment: NEGATIVE
Comment: NEGATIVE
Comment: NEGATIVE
Comment: NEGATIVE
Comment: NEGATIVE
Comment: NORMAL
Neisseria Gonorrhea: NEGATIVE
Trichomonas: NEGATIVE

## 2024-03-01 LAB — RPR: RPR Ser Ql: NONREACTIVE

## 2024-03-01 LAB — HIV ANTIBODY (ROUTINE TESTING W REFLEX): HIV 1&2 Ab, 4th Generation: NONREACTIVE

## 2024-03-01 LAB — HEPATITIS C ANTIBODY: Hepatitis C Ab: NONREACTIVE

## 2024-03-01 MED ORDER — METRONIDAZOLE 0.75 % VA GEL: 1.0000 | Freq: Every day | VAGINAL | 0 refills | Status: DC

## 2024-03-02 ENCOUNTER — Telehealth: Payer: Self-pay

## 2024-03-02 NOTE — Telephone Encounter (Signed)
Please review and advise. Thanks. Dm/cma  

## 2024-03-02 NOTE — Telephone Encounter (Signed)
 Copied from CRM [id number]. Topic: Clinical - Lab/Test Results >> Mar 01, 2024  4:07 PM Jayma L wrote: Reason for CRM: patient seen test results and asking why her results came back positive for Bacterial Vaginitis (gardnerella), said there is no symptoms with this just wants to know what it means

## 2024-03-06 ENCOUNTER — Other Ambulatory Visit: Payer: Self-pay | Admitting: Internal Medicine

## 2024-03-06 ENCOUNTER — Telehealth: Payer: Self-pay

## 2024-03-06 DIAGNOSIS — N760 Acute vaginitis: Secondary | ICD-10-CM

## 2024-03-06 MED ORDER — METRONIDAZOLE 500 MG PO TABS: 500.0000 mg | ORAL_TABLET | Freq: Two times a day (BID) | ORAL | 0 refills | Status: AC

## 2024-03-06 NOTE — Telephone Encounter (Signed)
 Pt wanting to get pills instead of cream that was given. Prescribed by Rosina Senters, NP on 03/01/24 metroNIDAZOLE  (METROGEL ) 0.75 % vaginal gel   Avelina Finder, CMA

## 2024-03-06 NOTE — Telephone Encounter (Signed)
 Rx changed and sent to pharmacy.

## 2024-03-07 ENCOUNTER — Ambulatory Visit: Admitting: Nurse Practitioner

## 2024-03-07 ENCOUNTER — Encounter: Payer: Self-pay | Admitting: Nurse Practitioner

## 2024-03-07 VITALS — BP 126/68 | HR 98 | Temp 98.1°F | Ht 64.0 in | Wt 179.0 lb

## 2024-03-07 DIAGNOSIS — R102 Pelvic and perineal pain: Secondary | ICD-10-CM

## 2024-03-07 NOTE — Progress Notes (Signed)
   Acute Office Visit  Subjective:    Patient ID: Olivia Hayes, female    DOB: 1995/07/27, 28 y.o.   MRN: [id number]  Chief Complaint  Patient presents with   Abdominal Pain    Left abdominal pain sharp intermittent for 2 months concerned about PID   Patient is in today for hx of intermittent Left pelvic pain, last episode occurred last week during ovulation per menstruation calendar. Hx of dysmenorrhea with menstrual cycle LMP 02/29/2024-03/06/2024. Has vaginal spotting between cycle. Paraguard IUD in place since 2022, Last sexual activity 3weeks ago, No dyspareunia She denies any apin during visit today. STI screen and urine pregnancy test completed 02/28/2024. These were negative except positive BV. She has not started oral metronidazole  sent. She is concerned about possible PID and an ovarian cyst?  Outpatient Medications Prior to Visit  Medication Sig   cetirizine  (ZYRTEC ) 10 MG chewable tablet Chew 10 mg by mouth as needed for allergies or rhinitis.   diphenhydrAMINE  (BENADRYL ) 50 MG tablet Take 1 tablet (50 mg total) by mouth at bedtime as needed for itching or allergies.   fluticasone  (FLONASE ) 50 MCG/ACT nasal spray SPRAY 2 SPRAYS INTO EACH NOSTRIL EVERY DAY (Patient taking differently: Place 2 sprays into both nostrils as needed for allergies or rhinitis.)   metroNIDAZOLE  (FLAGYL ) 500 MG tablet Take 1 tablet (500 mg total) by mouth 2 (two) times daily for 7 days.   paragard intrauterine copper IUD IUD 1 each by Intrauterine route once. Inserted 2022   lamoTRIgine (LAMICTAL) 25 MG tablet Take 25 mg by mouth daily. (Patient not taking: Reported on 02/28/2024)   No facility-administered medications prior to visit.    Reviewed past medical and social history.  Review of Systems  Constitutional:  Negative for fever.  Gastrointestinal:  Positive for abdominal pain. Negative for abdominal distention, anal bleeding, blood in stool, constipation, diarrhea, nausea, rectal pain and  vomiting.  Genitourinary:  Positive for pelvic pain. Negative for difficulty urinating, dyspareunia, dysuria, hematuria, menstrual problem, urgency and vaginal discharge.  Hematological: Negative.    Per HPI     Objective:    Physical Exam Vitals and nursing note reviewed.  Abdominal:     General: Abdomen is flat. Bowel sounds are normal.     Palpations: Abdomen is soft.     Tenderness: There is no abdominal tenderness.  Neurological:     Mental Status: She is alert.    BP 126/68 (BP Location: Left Arm, Patient Position: Sitting, Cuff Size: Large)   Pulse 98   Temp 98.1 F (36.7 C) (Oral)   Ht 5' 4 (1.626 m)   Wt 179 lb (81.2 kg)   LMP 02/03/2024   SpO2 98%   BMI 30.73 kg/m    No results found for any visits on 03/07/24.     Assessment & Plan:   Problem List Items Addressed This Visit   None Visit Diagnoses       Pelvic pain    -  Primary     Possible ovarian cyst rupture with ovulation. No suspicion of PID. Complete metronidazole  as prescribed Use ibuprofen  or naproxen  prn  Schedule appointment with GYN  No orders of the defined types were placed in this encounter.  Return if symptoms worsen or fail to improve.    Roselie Mood, NP

## 2024-03-07 NOTE — Patient Instructions (Signed)
 Use ibuprofen  or naproxen  prn  Schedule appointment with GYN  Vaginal Infection (Bacterial Vaginosis): What to Know  Bacterial vaginosis is an infection of the vagina. It happens when the balance of normal germs (bacteria) in the vagina changes. It's common among females ages 54 to 30. If left untreated, it can increase your risk of getting a sexually transmitted infection (STI). If you're pregnant, you need to get treated right away. This infection can cause a baby to be born early or at a low birth weight. What are the causes? This happens when too many harmful germs grow in the vagina. The exact reason why this happens isn't known. You can't get this infection from toilet seats, bedding, swimming pools, or contact with objects around you. What increases the risk? Having new or multiple sexual partners, or unprotected sex. Douching. Using an intrauterine device (IUD). Smoking. Alcohol and drug abuse. Taking certain antibiotics. Being pregnant. You can get a vaginal infection without being sexually active. However, it most often occurs in sexually active females. What are the signs or symptoms? Some females have no symptoms. If you have symptoms, they may include: Olivia Hayes or white vaginal discharge. It can be watery or foamy. A fish-like smell, especially after sex or during your menstrual period. Itching in and around the vagina. Burning or pain with peeing. How is this diagnosed? This infection is diagnosed based on: Your medical history. A physical exam of the vagina. Checking a sample of vaginal fluid for harmful bacteria or uncommon cells. How is this treated? This condition is treated with antibiotics. These may be given as: A pill. A cream for your vagina. A medicine that you put into your vagina called a suppository. If the infection comes back, you may need more antibiotics. Follow these instructions at home: Medicines Take your medicines only as told. Take or apply your  antibiotics as told. Do not stop using them even if you start to feel better. General instructions If you have a female sexual partner, tell her about the infection. She should see her health care provider. Female partners don't need treatment. Avoid sex until treatment is complete. Drink more fluids as told. Keep the area around your vagina and rectum clean. Wash the area daily with warm water. Wipe yourself from front to back after pooping. If you're breastfeeding, talk to your provider about continuing during treatment. How is this prevented? Self-care Do not douche or use vaginal deodorant sprays. Douching can upset the balance of good and harmful bacteria in the vagina, which can cause an infection to happen again. Wear cotton or cotton-lined underwear. Avoid wearing tight pants or pantyhose, especially in the summer. Safe sex Use condoms correctly and every time you have sex. Use dental dams to protect yourself during oral sex. Limit the number of sexual partners. Get tested for STIs. Your sexual partner should also get tested. Drugs and alcohol Do not smoke, vape, or use nicotine or tobacco. Do not use drugs. Limit the amount of alcohol you drink because it can lead to risky sexual behavior. Where to find more information To learn more: Go to TonerPromos.no. Click Health Topics A-Z. Type bacterial vaginosis in the search box. American Sexual Health Association (ASHA): ashasexualhealth.org U.S. Department of Health and Health and safety inspector, Office on Women's Health: TravelLesson.ca Contact a health care provider if: Your symptoms don't get better, even after treatment. You have more discharge or pain when peeing. You have a fever or chills. You have pain in your belly or pelvis. You  have pain during sex. You have vaginal bleeding between menstrual periods. This information is not intended to replace advice given to you by your health care provider. Make sure you discuss any questions  you have with your health care provider. Document Revised: 12/08/2022 Document Reviewed: 12/08/2022 Elsevier Patient Education  2024 [employer].

## 2024-05-23 ENCOUNTER — Ambulatory Visit: Admitting: Family Medicine

## 2024-05-23 ENCOUNTER — Ambulatory Visit: Payer: Self-pay

## 2024-05-23 VITALS — BP 118/62 | HR 106 | Temp 97.8°F | Ht 64.0 in | Wt 182.0 lb

## 2024-05-23 DIAGNOSIS — J020 Streptococcal pharyngitis: Secondary | ICD-10-CM

## 2024-05-23 MED ORDER — AMOXICILLIN-POT CLAVULANATE 875-125 MG PO TABS: 1.0000 | ORAL_TABLET | Freq: Two times a day (BID) | ORAL | 0 refills | Status: DC

## 2024-05-23 MED ORDER — ONDANSETRON 4 MG PO TBDP: 4.0000 mg | ORAL_TABLET | Freq: Three times a day (TID) | ORAL | 0 refills | Status: AC | PRN

## 2024-05-23 MED ORDER — KETOROLAC TROMETHAMINE 60 MG/2ML IM SOLN
60.0000 mg | Freq: Once | INTRAMUSCULAR | Status: AC
  Administered 2024-05-23: 60 mg via INTRAMUSCULAR

## 2024-05-23 MED ORDER — IBUPROFEN 600 MG PO TABS: 600.0000 mg | ORAL_TABLET | Freq: Three times a day (TID) | ORAL | 0 refills | Status: AC | PRN

## 2024-05-23 MED ORDER — ACETAMINOPHEN 500 MG PO TABS
1000.0000 mg | ORAL_TABLET | Freq: Four times a day (QID) | ORAL | 0 refills | Status: AC | PRN
Start: 2024-05-23 — End: ?

## 2024-05-23 NOTE — Telephone Encounter (Signed)
 Noted scheduled today at 10:40 am. Dm/cma

## 2024-05-23 NOTE — Progress Notes (Signed)
 " Assessment & Plan   Assessment/Plan:    Assessment & Plan Acute streptococcal pharyngitis Positive test for streptococcus with symptoms of sore throat, odynophagia, neck pain, otalgia, myalgia, headache, chills, and dyspnea. No fever, cough, chest pain, or wheezing. Slightly tachycardic with stable blood pressure and normal oxygen saturation. Differential includes possible ear infection due to otalgia. Discussed potential for hospitalization if symptoms worsen. - Administered Toradol  injection for immediate pain relief. - Prescribed Augmentin  (amoxicillin -clavulanate) 875 mg-125 mg for antibiotic coverage. - Advised taking Augmentin  with yogurt to minimize gastrointestinal upset. - Prescribed ibuprofen  600 mg every 6 hours for pain and inflammation. - Advised alternating ibuprofen  with extra strength Tylenol  every 6 hours for pain management. - Encouraged hydration with fluids like Gatorade or Pedialyte. - Provided work note for rest until November 24th, 2025.  Nausea Likely secondary to ibuprofen  use and acute illness. No vomiting reported. - Prescribed Zofran  4 mg every 8 hours as needed for nausea.  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 No issues with ibuprofen  reported. - Avoid naproxen         There are no discontinued medications.  Return if symptoms worsen or fail to improve.        Subjective:   Encounter date: 05/23/2024  Olivia Hayes is a 28 y.o. female who has Multiple allergies; Acne; Globus sensation; Inattention; History of DVT (deep vein thrombosis); HSV-1 (herpes simplex virus 1) infection; Rectal bleeding; Seasonal allergic rhinitis; Intrauterine device; Trichomoniasis; Aphthous ulcer of mouth; Drug reaction; History of ecchymosis; Obesity (BMI 30.0-34.9); Keratosis pilaris; Urticaria; COVID-19; Right hip pain; Allergic rhinitis due to animal hair and dander; Annual physical exam; and Borderline hyperlipidemia on their problem list..   She  has a past  medical history of Asthma, BV (bacterial vaginosis), Depression, DVT (deep venous thrombosis) (HCC), HSV-1 (herpes simplex virus 1) infection, Pelvic floor dysfunction, Urticaria, and Yeast infection..   She presents with chief complaint of Sore Throat (Pt presents today with a sore throat,hurts to talk, hurts to touch neck, pain shooting her ear drums and she is very achy, also c/o headache and chill and states it is hard to breathe. Patient states this started yesterday ) .   Discussed the use of AI scribe software for clinical note transcription with the patient, who gave verbal consent to proceed.  History of Present Illness Olivia Hayes is a 28 year old female who presents with symptoms of pharyngitis.  Pharyngitis symptoms - Onset of symptoms yesterday - Sore throat with pain exacerbated by talking and palpation of the neck - Odynophagia causing difficulty breathing due to throat pain - Right-sided otalgia - Headaches and generalized myalgias - Chills and subjective fevers (temperature not measured at home) - No chest pain, cough, or wheezing - Severity of symptoms is significant enough to consider hospital evaluation  Gastrointestinal symptoms - Nausea, possibly related to frequent ibuprofen  use - Decreased oral intake due to throat pain and nausea  Medication use and response - Taking Dicor for symptoms without relief - Taking 800 mg ibuprofen  every four hours - Took DayQuil (contains acetaminophen , does not contain ibuprofen ) - Ibuprofen  does not cause adverse effects - Naproxen  causes urticaria  Infectious disease testing - Positive streptococcal test - Negative for COVID-19, influenza A, and influenza B     ROS  Past Surgical History:  Procedure Laterality Date   ABDOMINAL HERNIA REPAIR  age 48   INDUCED ABORTION      Outpatient Medications Prior to Visit  Medication Sig Dispense Refill   cetirizine  (ZYRTEC )  10 MG chewable tablet Chew 10 mg by mouth as  needed for allergies or rhinitis.     diphenhydrAMINE  (BENADRYL ) 50 MG tablet Take 1 tablet (50 mg total) by mouth at bedtime as needed for itching or allergies. 30 tablet 0   lamoTRIgine (LAMICTAL) 25 MG tablet Take 25 mg by mouth daily.     paragard intrauterine copper IUD IUD 1 each by Intrauterine route once. Inserted 2022     fluticasone  (FLONASE ) 50 MCG/ACT nasal spray SPRAY 2 SPRAYS INTO EACH NOSTRIL EVERY DAY (Patient not taking: Reported on 05/23/2024) 48 mL 1   No facility-administered medications prior to visit.    Family History  Problem Relation Age of Onset   Asthma Mother    Allergic rhinitis Mother    Thyroid  disease Mother    Colon cancer Neg Hx    Pancreatic cancer Neg Hx    Esophageal cancer Neg Hx    Stomach cancer Neg Hx    Rectal cancer Neg Hx    Liver cancer Neg Hx     Social History   Socioeconomic History   Marital status: Married    Spouse name: Donnie   Number of children: 1   Years of education: Not on file   Highest education level: Not on file  Occupational History   Not on file  Tobacco Use   Smoking status: Former    Types: Cigarettes    Passive exposure: Past   Smokeless tobacco: Never  Vaping Use   Vaping status: Former  Substance and Sexual Activity   Alcohol use: Yes    Comment: Social   Drug use: Not Currently    Types: Marijuana    Comment: last smoked beginning February 2021   Sexual activity: Yes    Birth control/protection: I.U.D.    Comment: intercourse age 17, less than 5 sexua partners  Other Topics Concern   Not on file  Social History Narrative   Not on file   Social Drivers of Health   Financial Resource Strain: Not on file  Food Insecurity: No Food Insecurity (04/29/2021)   Hunger Vital Sign    Worried About Running Out of Food in the Last Year: Never true    Ran Out of Food in the Last Year: Never true  Recent Concern: Food Insecurity - Food Insecurity Present (03/06/2021)   Hunger Vital Sign    Worried About  Running Out of Food in the Last Year: Sometimes true    Ran Out of Food in the Last Year: Never true  Transportation Needs: No Transportation Needs (04/29/2021)   PRAPARE - Administrator, Civil Service (Medical): No    Lack of Transportation (Non-Medical): No  Physical Activity: Not on file  Stress: Not on file (05/14/2023)  Social Connections: Not on file  Intimate Partner Violence: Not At Risk (10/10/2019)   Humiliation, Afraid, Rape, and Kick questionnaire    Fear of Current or Ex-Partner: No    Emotionally Abused: No    Physically Abused: No    Sexually Abused: No  Objective:  Physical Exam: BP 118/62   Pulse (!) 106   Temp 97.8 F (36.6 C)   Ht 5' 4 (1.626 m)   Wt 182 lb (82.6 kg)   SpO2 98%   BMI 31.24 kg/m    Physical Exam VITALS: P- 106, BP- 118/62, SaO2- 98% GENERAL: Appears ill, but not toxic HEENT: Normocephalic, pharynx red and swollen, moist mucous membranes. CHEST: Clear to auscultation bilaterally, no wheezes, rhonchi, or crackles. CARDIOVASCULAR: Normal heart rate and rhythm, S1 and S2 normal without murmurs. ABDOMEN: Soft, non-tender, non-distended, without organomegaly, normal bowel sounds. EXTREMITIES: No cyanosis or edema. NEUROLOGICAL: Cranial nerves grossly intact, moves all extremities without gross motor or sensory deficit.   Physical Exam  No results found.  Recent Results (from the past 2160 hours)  POCT Pregnancy, Urine     Status: Normal   Collection Time: 02/28/24  1:36 PM  Result Value Ref Range   Negative    Cervicovaginal ancillary only     Status: Abnormal   Collection Time: 02/28/24  1:36 PM  Result Value Ref Range   Neisseria Gonorrhea Negative    Chlamydia Negative    Trichomonas Negative    Bacterial Vaginitis (gardnerella) Positive (A)    Candida Vaginitis Negative    Candida Glabrata Negative    Comment Normal  Reference Range Candida Species - Negative    Comment Normal Reference Range Candida Galbrata - Negative    Comment Normal Reference Range Trichomonas - Negative    Comment Normal Reference Ranger Chlamydia - Negative    Comment      Normal Reference Range Neisseria Gonorrhea - Negative   Comment      Normal Reference Range Bacterial Vaginosis - Negative  RPR     Status: None   Collection Time: 02/28/24  1:56 PM  Result Value Ref Range   RPR Ser Ql NON-REACTIVE NON-REACTIVE    Comment: . No laboratory evidence of syphilis. If recent exposure is suspected, submit a new sample in 2-4 weeks. SABRA   HIV antibody (with reflex)     Status: None   Collection Time: 02/28/24  1:56 PM  Result Value Ref Range   HIV FINAL INTERPRETATION      Comment: HIV Negative . HIV-1 antigen and HIV-1/HIV-2 antibodies were not detected. There is no laboratory evidence of HIV infection.    HIV 1&2 Ab, 4th Generation NON-REACTIVE NON-REACTIVE  Hepatitis C antibody     Status: None   Collection Time: 02/28/24  1:56 PM  Result Value Ref Range   Hepatitis C Ab NON-REACTIVE NON-REACTIVE    Comment: . HCV antibody was non-reactive. There is no laboratory  evidence of HCV infection. . In most cases, no further action is required. However, if recent HCV exposure is suspected, a test for HCV RNA (test code 64354) is suggested. . For additional information please refer to http://education.[facility].com/faq/FAQ22v1 (This link is being provided for informational/ educational purposes only.) .         Beverley Adine Hummer, MD, MS "

## 2024-05-23 NOTE — Patient Instructions (Signed)
 It was very nice to see you today!  VISIT SUMMARY: You came in today with a sore throat and other symptoms that started yesterday. We confirmed you have strep throat and provided treatment to help manage your symptoms and pain.  YOUR PLAN: ACUTE STREPTOCOCCAL PHARYNGITIS: You have a confirmed strep throat infection causing a sore throat, difficulty swallowing, neck pain, ear pain, muscle aches, headaches, chills, and trouble breathing. -You received a Toradol injection for immediate pain relief. -You are prescribed Augmentin  (amoxicillin -clavulanate) 875 mg-125 mg. Take this with yogurt to minimize stomach upset. -Take ibuprofen  600 mg every 6 hours for pain and inflammation. -Alternate ibuprofen  with extra strength Tylenol  every 6 hours for pain management. -Stay hydrated with fluids like Gatorade or Pedialyte. -You have a work note to rest until November 24th, 2025.  NAUSEA: You are experiencing nausea likely due to ibuprofen  use and your illness. -You are prescribed Zofran  4 mg every 8 hours as needed for nausea.  ALLERGY  TO NAPROXEN : You have a known allergy  to naproxen , which causes hives. -Avoid naproxen  and related NSAIDs.  No follow-ups on file.   Take care, Arvella Hummer, MD, MS   PLEASE NOTE:  If you had any lab tests, please let us  know if you have not heard back within a few days. You may see your results on mychart before we have a chance to review them but we will give you a call once they are reviewed by us .   If we ordered any referrals today, please let us  know if you have not heard from their office within the next week.   If you had any urgent prescriptions sent in today, please check with the pharmacy within an hour of our visit to make sure the prescription was transmitted appropriately.   Please try these tips to maintain a healthy lifestyle:  Eat at least 3 REAL meals and 1-2 snacks per day.  Aim for no more than 5 hours between eating.  If you eat  breakfast, please do so within one hour of getting up.   Each meal should contain half fruits/vegetables, one quarter protein, and one quarter carbs (no bigger than a computer mouse)  Cut down on sweet beverages. This includes juice, soda, and sweet tea.   Drink at least 1 glass of water with each meal and aim for at least 8 glasses per day  Exercise at least 150 minutes every week.

## 2024-05-23 NOTE — Telephone Encounter (Signed)
 FYI Only or Action Required?: FYI only for provider: appointment scheduled on 05/23/2024.  Patient was last seen in primary care on 03/07/2024 by Nche, Roselie Rockford, NP.  Called Nurse Triage reporting Sore Throat.  Symptoms began yesterday.  Interventions attempted: Nothing.  Symptoms are: rapidly worsening.  Triage Disposition: See HCP Within 4 Hours (Or PCP Triage)  Patient/caregiver understands and will follow disposition?: Yes     Copied from CRM #[id number]. Topic: Clinical - Red Word Triage >> May 23, 2024  8:17 AM Thersia BROCKS wrote: [facility] that prompted transfer to Nurse Triage: Patient called in stated she believes she has strep throat, painful to swallow, swollen neck   ----------------------------------------------------------------------- From previous Reason for Contact - Scheduling: Patient/patient representative is calling to schedule an appointment. Refer to attachments for appointment information. Reason for Disposition  [1] SEVERE pain (e.g., excruciating) and [2] not improved 2 hours after pain medicine (e.g., acetaminophen  or ibuprofen )  SEVERE throat pain (e.g., excruciating)  Answer Assessment - Initial Assessment Questions 1. ONSET: When did the throat start hurting? (Hours or days ago)      yesterday 2. SEVERITY: How bad is the sore throat? (Scale 1-10; mild, moderate or severe)     severe 3. STREP EXPOSURE: Has there been any exposure to strep within the past week? If Yes, ask: What type of contact occurred?      unsure 4.  VIRAL SYMPTOMS: Are there any symptoms of a cold, such as a runny nose, cough, hoarse voice or red eyes?      no 5. FEVER: Do you have a fever? If Yes, ask: What is your temperature, how was it measured, and when did it start?     Warm with chills 6. PUS ON THE TONSILS: Is there pus on the tonsils in the back of your throat?     unsure 7. OTHER SYMPTOMS: Do you have any other symptoms? (e.g., difficulty breathing,  headache, rash)     SOB at times, headache more on right 8. PREGNANCY: Is there any chance you are pregnant? When was your last menstrual period?     No Right neck swollen, hurts to touch neck,  Answer Assessment - Initial Assessment Questions 1. LOCATION: Which ear is involved?     Right ear 2. ONSET: When did the ear pain start?      yesterday 3. SEVERITY: How bad is the pain?  (Scale 1-10; mild, moderate or severe)     Moderate  4. URI SYMPTOMS: Do you have a runny nose or cough?     no 5. FEVER: Do you have a fever? If Yes, ask: What is your temperature, how was it measured, and when did it start?     Warm and chills 6. CAUSE: Have you been swimming recently?, How often do you use Q-TIPS?, Have you had any recent air travel or scuba diving?     no 7. OTHER SYMPTOMS: Do you have any other symptoms? (e.g., decreased hearing, dizziness, headache, stiff neck, vomiting)     Ear drainage - right ear 8. PREGNANCY: Is there any chance you are pregnant? When was your last menstrual period?     na  Protocols used: Sore Throat-A-AH, Rilla

## 2024-05-24 ENCOUNTER — Emergency Department (HOSPITAL_COMMUNITY)

## 2024-05-24 ENCOUNTER — Inpatient Hospital Stay (HOSPITAL_COMMUNITY)
Admission: EM | Admit: 2024-05-24 | Discharge: 2024-05-27 | DRG: 153 | Disposition: A | Discharge status: Home / Self Care | Attending: Internal Medicine | Admitting: Internal Medicine

## 2024-05-24 ENCOUNTER — Encounter (HOSPITAL_COMMUNITY): Payer: Self-pay

## 2024-05-24 ENCOUNTER — Ambulatory Visit: Payer: Self-pay

## 2024-05-24 ENCOUNTER — Other Ambulatory Visit: Payer: Self-pay

## 2024-05-24 DIAGNOSIS — Z86718 Personal history of other venous thrombosis and embolism: Secondary | ICD-10-CM

## 2024-05-24 DIAGNOSIS — Z888 Allergy status to other drugs, medicaments and biological substances status: Secondary | ICD-10-CM

## 2024-05-24 DIAGNOSIS — Z87891 Personal history of nicotine dependence: Secondary | ICD-10-CM

## 2024-05-24 DIAGNOSIS — E66811 Obesity, class 1: Secondary | ICD-10-CM | POA: Diagnosis present

## 2024-05-24 DIAGNOSIS — R0789 Other chest pain: Secondary | ICD-10-CM

## 2024-05-24 DIAGNOSIS — E876 Hypokalemia: Secondary | ICD-10-CM | POA: Diagnosis present

## 2024-05-24 DIAGNOSIS — R590 Localized enlarged lymph nodes: Secondary | ICD-10-CM | POA: Diagnosis present

## 2024-05-24 DIAGNOSIS — J45909 Unspecified asthma, uncomplicated: Secondary | ICD-10-CM | POA: Diagnosis not present

## 2024-05-24 DIAGNOSIS — E6609 Other obesity due to excess calories: Secondary | ICD-10-CM | POA: Diagnosis present

## 2024-05-24 DIAGNOSIS — J039 Acute tonsillitis, unspecified: Secondary | ICD-10-CM

## 2024-05-24 DIAGNOSIS — Z825 Family history of asthma and other chronic lower respiratory diseases: Secondary | ICD-10-CM

## 2024-05-24 DIAGNOSIS — J0300 Acute streptococcal tonsillitis, unspecified: Secondary | ICD-10-CM

## 2024-05-24 DIAGNOSIS — Z79899 Other long term (current) drug therapy: Secondary | ICD-10-CM

## 2024-05-24 DIAGNOSIS — F32A Depression, unspecified: Secondary | ICD-10-CM | POA: Diagnosis present

## 2024-05-24 DIAGNOSIS — Z872 Personal history of diseases of the skin and subcutaneous tissue: Secondary | ICD-10-CM

## 2024-05-24 DIAGNOSIS — Z8349 Family history of other endocrine, nutritional and metabolic diseases: Secondary | ICD-10-CM

## 2024-05-24 DIAGNOSIS — J36 Peritonsillar abscess: Principal | ICD-10-CM | POA: Diagnosis present

## 2024-05-24 DIAGNOSIS — Z886 Allergy status to analgesic agent status: Secondary | ICD-10-CM

## 2024-05-24 DIAGNOSIS — Z91010 Allergy to peanuts: Secondary | ICD-10-CM

## 2024-05-24 DIAGNOSIS — J9811 Atelectasis: Secondary | ICD-10-CM | POA: Diagnosis present

## 2024-05-24 DIAGNOSIS — L509 Urticaria, unspecified: Secondary | ICD-10-CM | POA: Diagnosis present

## 2024-05-24 DIAGNOSIS — R1310 Dysphagia, unspecified: Secondary | ICD-10-CM

## 2024-05-24 DIAGNOSIS — B009 Herpesviral infection, unspecified: Secondary | ICD-10-CM | POA: Diagnosis present

## 2024-05-24 DIAGNOSIS — Z6831 Body mass index (BMI) 31.0-31.9, adult: Secondary | ICD-10-CM

## 2024-05-24 LAB — CBC WITH DIFFERENTIAL/PLATELET
Abs Immature Granulocytes: 0.29 K/uL — ABNORMAL HIGH (ref 0.00–0.07)
Basophils Absolute: 0.1 K/uL (ref 0.0–0.1)
Basophils Relative: 0 %
Eosinophils Absolute: 0 K/uL (ref 0.0–0.5)
Eosinophils Relative: 0 %
HCT: 47.7 % — ABNORMAL HIGH (ref 36.0–46.0)
Hemoglobin: 16 g/dL — ABNORMAL HIGH (ref 12.0–15.0)
Immature Granulocytes: 1 %
Lymphocytes Relative: 2 %
Lymphs Abs: 0.5 K/uL — ABNORMAL LOW (ref 0.7–4.0)
MCH: 29.5 pg (ref 26.0–34.0)
MCHC: 33.5 g/dL (ref 30.0–36.0)
MCV: 87.8 fL (ref 80.0–100.0)
Monocytes Absolute: 2 K/uL — ABNORMAL HIGH (ref 0.1–1.0)
Monocytes Relative: 8 %
Neutro Abs: 22 K/uL — ABNORMAL HIGH (ref 1.7–7.7)
Neutrophils Relative %: 89 %
Platelets: 219 K/uL (ref 150–400)
RBC: 5.43 MIL/uL — ABNORMAL HIGH (ref 3.87–5.11)
RDW: 13.5 % (ref 11.5–15.5)
WBC: 24.9 K/uL — ABNORMAL HIGH (ref 4.0–10.5)
nRBC: 0 % (ref 0.0–0.2)

## 2024-05-24 LAB — I-STAT CHEM 8, ED
BUN: 8 mg/dL (ref 6–20)
Calcium, Ion: 1.13 mmol/L — ABNORMAL LOW (ref 1.15–1.40)
Chloride: 103 mmol/L (ref 98–111)
Creatinine, Ser: 1 mg/dL (ref 0.44–1.00)
Glucose, Bld: 121 mg/dL — ABNORMAL HIGH (ref 70–99)
HCT: 50 % — ABNORMAL HIGH (ref 36.0–46.0)
Hemoglobin: 17 g/dL — ABNORMAL HIGH (ref 12.0–15.0)
Potassium: 3.2 mmol/L — ABNORMAL LOW (ref 3.5–5.1)
Sodium: 137 mmol/L (ref 135–145)
TCO2: 22 mmol/L (ref 22–32)

## 2024-05-24 LAB — HCG, SERUM, QUALITATIVE: Preg, Serum: NEGATIVE

## 2024-05-24 MED ORDER — ONDANSETRON HCL 4 MG/2ML IJ SOLN
4.0000 mg | Freq: Once | INTRAMUSCULAR | Status: AC
  Administered 2024-05-24: 4 mg via INTRAVENOUS
  Filled 2024-05-24: qty 2

## 2024-05-24 MED ORDER — BISACODYL 5 MG PO TBEC: 5.0000 mg | DELAYED_RELEASE_TABLET | Freq: Every day | ORAL | Status: DC | PRN

## 2024-05-24 MED ORDER — ACETAMINOPHEN 650 MG RE SUPP: 650.0000 mg | Freq: Four times a day (QID) | RECTAL | Status: DC | PRN

## 2024-05-24 MED ORDER — IPRATROPIUM-ALBUTEROL 0.5-2.5 (3) MG/3ML IN SOLN: 3.0000 mL | Freq: Four times a day (QID) | RESPIRATORY_TRACT | Status: DC | PRN

## 2024-05-24 MED ORDER — SODIUM CHLORIDE 0.9 % IV SOLN
2.0000 g | Freq: Four times a day (QID) | INTRAVENOUS | Status: DC
  Administered 2024-05-24 – 2024-05-25 (×3): 2 g via INTRAVENOUS
  Filled 2024-05-24 (×4): qty 2000

## 2024-05-24 MED ORDER — LORATADINE 10 MG PO TABS: 10.0000 mg | ORAL_TABLET | Freq: Every day | ORAL | Status: DC | PRN

## 2024-05-24 MED ORDER — LACTATED RINGERS IV BOLUS
1000.0000 mL | Freq: Once | INTRAVENOUS | Status: AC
  Administered 2024-05-24: 1000 mL via INTRAVENOUS

## 2024-05-24 MED ORDER — ONDANSETRON HCL 4 MG PO TABS: 4.0000 mg | ORAL_TABLET | Freq: Four times a day (QID) | ORAL | Status: DC | PRN

## 2024-05-24 MED ORDER — HYDROMORPHONE HCL 1 MG/ML IJ SOLN
0.5000 mg | Freq: Four times a day (QID) | INTRAMUSCULAR | Status: DC | PRN
  Administered 2024-05-25: 0.5 mg via INTRAVENOUS
  Filled 2024-05-24: qty 1

## 2024-05-24 MED ORDER — ACETAMINOPHEN 325 MG PO TABS
650.0000 mg | ORAL_TABLET | Freq: Four times a day (QID) | ORAL | Status: DC | PRN
Start: 2024-05-24 — End: 2024-05-25
  Administered 2024-05-25: 650 mg via ORAL
  Filled 2024-05-24: qty 2

## 2024-05-24 MED ORDER — LACTATED RINGERS IV SOLN: INTRAVENOUS | Status: DC

## 2024-05-24 MED ORDER — ONDANSETRON HCL 4 MG/2ML IJ SOLN: 4.0000 mg | Freq: Four times a day (QID) | INTRAMUSCULAR | Status: DC | PRN

## 2024-05-24 MED ORDER — DEXAMETHASONE SOD PHOSPHATE PF 10 MG/ML IJ SOLN
10.0000 mg | Freq: Once | INTRAMUSCULAR | Status: AC
  Administered 2024-05-24: 10 mg via INTRAVENOUS

## 2024-05-24 MED ORDER — SENNOSIDES-DOCUSATE SODIUM 8.6-50 MG PO TABS: 1.0000 | ORAL_TABLET | Freq: Every evening | ORAL | Status: DC | PRN

## 2024-05-24 MED ORDER — MORPHINE SULFATE (PF) 4 MG/ML IV SOLN
4.0000 mg | Freq: Once | INTRAVENOUS | Status: AC
  Administered 2024-05-24: 4 mg via INTRAVENOUS
  Filled 2024-05-24: qty 1

## 2024-05-24 MED ORDER — IOHEXOL 350 MG/ML SOLN
75.0000 mL | Freq: Once | INTRAVENOUS | Status: AC | PRN
  Administered 2024-05-24: 75 mL via INTRAVENOUS

## 2024-05-24 MED ORDER — DIPHENHYDRAMINE HCL 25 MG PO CAPS
50.0000 mg | ORAL_CAPSULE | Freq: Every evening | ORAL | Status: DC | PRN
  Administered 2024-05-26: 50 mg via ORAL
  Filled 2024-05-24: qty 2

## 2024-05-24 MED ORDER — ENOXAPARIN SODIUM 40 MG/0.4ML IJ SOSY
40.0000 mg | PREFILLED_SYRINGE | INTRAMUSCULAR | Status: DC
  Administered 2024-05-24 – 2024-05-26 (×3): 40 mg via SUBCUTANEOUS
  Filled 2024-05-24 (×3): qty 0.4

## 2024-05-24 MED ORDER — CLINDAMYCIN PHOSPHATE 300 MG/50ML IV SOLN: 300.0000 mg | Freq: Once | INTRAVENOUS | Status: DC

## 2024-05-24 MED ORDER — POTASSIUM CHLORIDE 10 MEQ/100ML IV SOLN
10.0000 meq | INTRAVENOUS | Status: AC
  Administered 2024-05-25 (×3): 10 meq via INTRAVENOUS
  Filled 2024-05-24 (×3): qty 100

## 2024-05-24 NOTE — ED Notes (Signed)
 Patient transported to CT

## 2024-05-24 NOTE — H&P (Signed)
 History and Physical  Olivia Hayes FMW:[id number] DOB: 05/17/1996 DOA: 05/24/2024  PCP: Thedora Garnette HERO, MD   Chief Complaint: Sore throat, dysphagia  HPI: Olivia Hayes is a 28 y.o. female with medical history significant for asthma, depression, DVT not on OAC and urticaria who presented to the ED for evaluation of sore throat and dysphagia. Patient reports she started having sore throat Tuesday night.  She was evaluated at her PCP yesterday and tested positive for strep. She was prescribed amoxicillin  however she was unable to take this due to difficulty swallowing. She reports a feeling of choking when attempting to swallow water, pills or food. She also endorsed associated nausea, vomiting odynophagia, headache, chills, and mild shortness of breath but no fevers, chest pain, palpitations, abdominal pain or dizziness  ED Course: Initial vitals show temp 1-2.5, RR 22, HR 100-120s, SBP 100-130s, SpO2 100% on room air. Initial labs significant for WBC 24.9, Hgb 16.0, K+ 3.2, negative pregnancy test.  CT soft tissue neck shows signs of tonsillitis and a 2.9 x 1.3 x 2.9 cm irregular collection along the right palatine tonsil, concerning for developing peritonsillar abscess.  Pt received IV Decadron , IV morphine , IV ampicillin and IV LR 1 L bolus.  ENT was consulted for evaluation. [facility] was consulted for admission.   Review of Systems: Please see HPI for pertinent positives and negatives. A complete 10 system review of systems are otherwise negative.  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 Her smoking use included cigarettes. She has been exposed to tobacco smoke. She has never used smokeless tobacco. She reports current alcohol use. She reports that she does not currently use drugs after having used the following drugs: Marijuana.  Allergies  Allergen Reactions   Peanuts [Peanut  Oil] Anaphylaxis and Hives   Naproxen  Hives    Family History  Problem Relation Age of Onset   Asthma Mother    Allergic rhinitis Mother    Thyroid  disease Mother    Colon cancer Neg Hx    Pancreatic cancer Neg Hx    Esophageal cancer Neg Hx    Stomach cancer Neg Hx    Rectal cancer Neg Hx    Liver cancer Neg Hx      Prior to Admission medications   Medication Sig Start Date End Date Taking? Authorizing Provider  acetaminophen  (TYLENOL ) 500 MG tablet Take 2 tablets (1,000 mg total) by mouth every 6 (six) hours as needed for fever or headache (and pain). 05/23/24  Yes Sebastian Beverley NOVAK, MD  amoxicillin -clavulanate (AUGMENTIN ) 875-125 MG tablet Take 1 tablet by mouth 2 (two) times daily. 05/23/24  Yes Sebastian Beverley NOVAK, MD  cetirizine  (ZYRTEC ) 10 MG chewable tablet Chew 10 mg by mouth as needed for allergies or rhinitis.   Yes [provider]  diphenhydrAMINE  (BENADRYL ) 50 MG tablet Take 1 tablet (50 mg total) by mouth at bedtime as needed for itching or allergies. 12/14/23  Yes McElwee, Lauren A,  NP  ibuprofen  (ADVIL ) 600 MG tablet Take 1 tablet (600 mg total) by mouth every 8 (eight) hours as needed for fever or headache (and pain). 05/23/24  Yes Sebastian Beverley NOVAK, MD  paragard intrauterine copper IUD IUD 1 each by Intrauterine route once. Inserted 2022   Yes [provider]  lamoTRIgine (LAMICTAL) 25 MG tablet Take 25 mg by mouth daily. Patient not taking: Reported on 05/24/2024 12/14/23   [provider]  ondansetron  (ZOFRAN -ODT) 4 MG disintegrating tablet Take 1 tablet (4 mg total) by mouth every 8 (eight) hours as needed for nausea or vomiting. Patient  not taking: Reported on 05/24/2024 05/23/24   Sebastian Beverley NOVAK, MD    Physical Exam: BP 106/70   Pulse (!) 114   Temp 100.1 F (37.8 C) (Oral)   Resp (!) 26   Ht 5' 4 (1.626 m)   Wt 82.6 kg   SpO2 98%   BMI 31.24 kg/m  General: Pleasant, acutely ill young woman laying in bed. No acute distress. HEENT: [place]/AT. Anicteric sclera. Mild tonsillar exudate with edema of the right peritonsillar area. No trismus or stridor. Mild cervical adenopathy. CV: Tachycardic. Regular rhythm. No murmurs, rubs, or gallops. No LE edema Pulmonary: Mild tachypnea. Lungs CTAB. Normal effort. No wheezing or rales. Abdominal: Soft, nontender, nondistended. Normal bowel sounds. Extremities: Palpable radial and DP pulses. Normal ROM. Skin: Warm and dry. No obvious rash or lesions. Neuro: A&Ox3. Moves all extremities. Normal sensation to light touch. No focal deficit. Psych: Normal mood and affect          Labs on Admission:  Basic Metabolic Panel: Recent Labs  Lab 05/24/24 1610  NA 137  K 3.2*  CL 103  GLUCOSE 121*  BUN 8  CREATININE 1.00   Liver Function Tests: No results for input(s): AST, ALT, ALKPHOS, BILITOT, PROT, ALBUMIN in the last 168 hours. No results for input(s): LIPASE, AMYLASE in the last 168 hours. No results for input(s): AMMONIA in the last 168 hours. CBC: Recent Labs  Lab 05/24/24 1600 05/24/24 1610  WBC 24.9*  --   NEUTROABS 22.0*  --   HGB 16.0* 17.0*  HCT 47.7* 50.0*  MCV 87.8  --   PLT 219  --    Cardiac Enzymes: No results for input(s): CKTOTAL, CKMB, CKMBINDEX, TROPONINI in the last 168 hours. BNP (last 3 results) No results for input(s): BNP in the last 8760 hours.  ProBNP (last 3 results) No results for input(s): PROBNP in the last 8760 hours.  CBG: No results for input(s): GLUCAP in the last 168 hours.  Radiological Exams on Admission: CT Soft Tissue Neck W Contrast Addendum Date: 05/24/2024  ADDENDUM #1: Findings  discussed with Dr. Doretha at 6:41 PM on 05/24/24. ---------------------------------------------------- Electronically signed by: Donnice Mania MD 05/24/2024 11:25 PM EST RP Workstation: [id number]   Result Date: 05/24/2024  ORIGINAL REPORT EXAM: CT NECK WITH CONTRAST 05/24/2024 05:22:00 PM TECHNIQUE: CT of the neck was performed with the administration of 75 mL of iohexol  (OMNIPAQUE ) 350 MG/ML injection. Multiplanar reformatted images are provided for review. Automated exposure control, iterative reconstruction, and/or weight based adjustment of the mA/kV was utilized to reduce the radiation dose to as low as reasonably achievable. COMPARISON: None available. CLINICAL HISTORY: Epiglottitis or tonsillitis suspected; concern for PTA. FINDINGS: AERODIGESTIVE TRACT: There is significant enlargement at the bilateral palatine tonsils with striated enhancement compatible with tonsillitis. There is associated moderate narrowing of the oropharyngeal airway along the lateral aspect and posterolateral aspect of the right  palatine tonsil. There is a 2.9 x 1.3 x 2.9 cm focus of irregular hypoattenuation. There are areas of peripheral enhancement noted posteriorly along this collection. Findings are concerning for developing peritonsillar abscess. There is likely a component of phlegmon along the anterolateral aspect of the right palatine tonsil. Enhancement of the pharyngeal mucosa suggestive of pharyngitis. There is edema extending along the right aspect of the oropharynx to the level of the hypopharynx. There is asymmetric effacement of the right piriform sinus. SALIVARY GLANDS: The parotid and submandibular glands are unremarkable. THYROID : Unremarkable. LYMPH NODES: Enlarged bilateral level 2a nodes with additional prominent bilateral level 1b nodes. Largest node on the right in level 2 measures up to 1.3 cm in short axis and largest node on the left measures up to 1.4 cm. Additional subcentimeter cervical lymph nodes  bilaterally. SOFT TISSUES: No evidence of collection or significant soft tissue swelling involving the floor of mouth. BRAIN, ORBITS, SINUSES AND MASTOIDS: No acute abnormality. Mucosal thickening in the bilateral maxillary sinuses. LUNGS AND MEDIASTINUM: No acute abnormality. BONES: No focal bone abnormality. IMPRESSION: 1. Significant enlargement of the bilateral palatine tonsils with striated enhancement compatible with tonsillitis. Associated moderate narrowing of the oropharyngeal airway. 2. There is a 2.9 x 1.3 x 2.9 cm irregular collection along the right palatine tonsil, concerning for developing peritonsillar abscess. Posteriorly and inferiorly the collection appears more organized and concerning for abscess. Anteriorly it is more ill-defined and appears phlegmonous. 3. Enlarged bilateral level 2a nodes, likely reactive. 4. Mucosal thickening in the bilateral maxillary sinuses. Electronically signed by: Donnice Mania MD 05/24/2024 06:40 PM EST RP Workstation: [id number]   Assessment/Plan Zitlaly Malson is a 28 y.o. female with medical history significant for asthma, depression, DVT not on OAC and urticaria who presented to the ED for evaluation of sore throat and dysphagia and admitted for peritonsillar abscess  # Peritonsillar abscess/phlegmon # Tonsillitis - Patient with a recent diagnosis of acute Streptococcus pharyngitis presented with worsening sore throat, odynophagia, dysphagia and inability to tolerate p.o. intake. - Imaging of the soft tissue neck shows signs of tonsillitis and a 2.9 x 1.3 x 2.9 cm irregular collection along the right palatine tonsil, concerning for developing peritonsillar abscess - ENT consulted, no surgical intervention indicated at the moment, recommends continued IV antibiotics and reconsulting ENT if symptoms worsens - Patient reports some improvement after steroid and morphine  in the ED, currently protecting airway, O2 sats 100% on room air - Continue IV  ampicillin - Continue IV LR 125 cc/h - IV Dilaudid as needed for pain - Monitor respiratory status closely - Trend CBC, fever curve  # Hypokalemia - K+ 3.2 on admission likely secondary to nausea and vomiting - Start IV KCl 10 mEq x 3 hours - Follow-up morning mag and K+  # Asthma - Chronic and stable, no signs of acute exacerbation - As needed DuoNebs  # History of Urticaria - As needed loratadine and Benadryl   # Class I obesity Body mass index is 31.24 kg/m. Filed Weights   05/24/24 1510  Weight: 82.6 kg  - F/u with PCP for weight lost and nutrition counseling   DVT prophylaxis: Lovenox      Code Status: Full Code  Consults called: ENT  Family Communication: No family at bedside  Severity of Illness: The appropriate patient status for this patient is INPATIENT. Inpatient status is judged to be reasonable and necessary in order to provide the required intensity of service to ensure the patient's safety. The patient's presenting symptoms, physical exam  findings, and initial radiographic and laboratory data in the context of their chronic comorbidities is felt to place them at high risk for further clinical deterioration. Furthermore, it is not anticipated that the patient will be medically stable for discharge from the hospital within 2 midnights of admission.   * I certify that at the point of admission it is my clinical judgment that the patient will require inpatient hospital care spanning beyond 2 midnights from the point of admission due to high intensity of service, high risk for further deterioration and high frequency of surveillance required.*  Level of care: Progressive    Lou Claretta HERO, MD 05/24/2024, 11:46 PM [facility] Pager: [phone] Isaiah 41:10   If 7PM-7AM, please contact night-coverage [email or web address] Password [email or web address]

## 2024-05-24 NOTE — ED Triage Notes (Signed)
 Patient dx with strep yesterday and started on antbiotics but has been vomiting since, hoarse voice and can't keep anything down or open mouth all the way to see tonsils.  Patient complains of bodyaches.  Per note from yesterday reported patient might need hospitalization. n

## 2024-05-24 NOTE — ED Provider Notes (Signed)
 [place] EMERGENCY DEPARTMENT AT [facility] Provider Note   CSN: [id number] Arrival date & time: 05/24/24  1456     Patient presents with: Sore Throat (/) and Dysphagia   Olivia Hayes is a 28 y.o. female.   Patient is a 28 year old female with a history of prior DVT resulting from OCPs but no longer on anticoagulation, depression and asthma who is presenting today with worsening sore throat.  Patient was seen at her PCP yesterday after having a 2-day history of sore throat and fever.  She tested strep positive at PCPs and was started on Augmentin .  She reports since being home she has not been able to hold down any of the medication and has been vomiting.  She continues to have fever and complaints of severe sore throat worse on the right side with some changes in her voice and severe pain with swallowing.  She has been able to tolerate secretions but unable to eat or drink.  She denies cough, shortness of breath, nasal congestion.  Last menses was within the last month  The history is provided by the patient and medical records.  Sore Throat       Prior to Admission medications   Medication Sig Start Date End Date Taking? Authorizing Provider  acetaminophen  (TYLENOL ) 500 MG tablet Take 2 tablets (1,000 mg total) by mouth every 6 (six) hours as needed for fever or headache (and pain). 05/23/24   Sebastian Beverley NOVAK, MD  amoxicillin -clavulanate (AUGMENTIN ) 875-125 MG tablet Take 1 tablet by mouth 2 (two) times daily. 05/23/24   Sebastian Beverley NOVAK, MD  cetirizine  (ZYRTEC ) 10 MG chewable tablet Chew 10 mg by mouth as needed for allergies or rhinitis.    [provider]  diphenhydrAMINE  (BENADRYL ) 50 MG tablet Take 1 tablet (50 mg total) by mouth at bedtime as needed for itching or allergies. 12/14/23   McElwee, Lauren A, NP  fluticasone  (FLONASE ) 50 MCG/ACT nasal spray SPRAY 2 SPRAYS INTO EACH NOSTRIL EVERY DAY Patient not taking: Reported on 05/23/2024 09/08/23   Nche,  Roselie Rockford, NP  ibuprofen  (ADVIL ) 600 MG tablet Take 1 tablet (600 mg total) by mouth every 8 (eight) hours as needed for fever or headache (and pain). 05/23/24   Sebastian Beverley NOVAK, MD  lamoTRIgine (LAMICTAL) 25 MG tablet Take 25 mg by mouth daily. 12/14/23   [provider]  ondansetron  (ZOFRAN -ODT) 4 MG disintegrating tablet Take 1 tablet (4 mg total) by mouth every 8 (eight) hours as needed for nausea or vomiting. 05/23/24   Sebastian Beverley NOVAK, MD  paragard intrauterine copper IUD IUD 1 each by Intrauterine route once. Inserted 2022    [provider]    Allergies: Peanuts [peanut  oil] and Naproxen     Review of Systems  Updated Vital Signs BP 106/70   Pulse (!) 114   Temp (!) 102.5 F (39.2 C)   Resp (!) 26   Ht 5' 4 (1.626 m)   Wt 82.6 kg   SpO2 98%   BMI 31.24 kg/m   Physical Exam Vitals and nursing note reviewed.  Constitutional:      General: She is not in acute distress.    Appearance: She is well-developed.     Comments: Appears uncomfortable  HENT:     Head: Normocephalic and atraumatic.     Mouth/Throat:     Mouth: Mucous membranes are dry.     Tonsils: Tonsillar exudate present. 2+ on the right.     Comments: Fullness noted  in the right peritonsillar area Eyes:     Pupils: Pupils are equal, round, and reactive to light.  Neck:     Comments: Voice changes noted but no trismus or stridor Cardiovascular:     Rate and Rhythm: Regular rhythm. Tachycardia present.     Heart sounds: Normal heart sounds. No murmur heard.    No friction rub.  Pulmonary:     Effort: Pulmonary effort is normal.     Breath sounds: Normal breath sounds. No wheezing or rales.  Abdominal:     General: Bowel sounds are normal. There is no distension.     Palpations: Abdomen is soft.     Tenderness: There is no abdominal tenderness. There is no guarding or rebound.  Musculoskeletal:        General: No tenderness. Normal range of motion.     Comments: No edema   Lymphadenopathy:     Cervical: Cervical adenopathy present.  Skin:    General: Skin is warm and dry.     Findings: No rash.  Neurological:     Mental Status: She is alert and oriented to person, place, and time. Mental status is at baseline.     Cranial Nerves: No cranial nerve deficit.  Psychiatric:        Behavior: Behavior normal.     (all labs ordered are listed, but only abnormal results are displayed) Labs Reviewed  CBC WITH DIFFERENTIAL/PLATELET - Abnormal; Notable for the following components:      Result Value   WBC 24.9 (*)    RBC 5.43 (*)    Hemoglobin 16.0 (*)    HCT 47.7 (*)    Neutro Abs 22.0 (*)    Lymphs Abs 0.5 (*)    Monocytes Absolute 2.0 (*)    Abs Immature Granulocytes 0.29 (*)    All other components within normal limits  I-STAT CHEM 8, ED - Abnormal; Notable for the following components:   Potassium 3.2 (*)    Glucose, Bld 121 (*)    Calcium , Ion 1.13 (*)    Hemoglobin 17.0 (*)    HCT 50.0 (*)    All other components within normal limits  HCG, SERUM, QUALITATIVE    EKG: None  Radiology: CT Soft Tissue Neck W Contrast Result Date: 05/24/2024 EXAM: CT NECK WITH CONTRAST 05/24/2024 05:22:00 PM TECHNIQUE: CT of the neck was performed with the administration of 75 mL of iohexol  (OMNIPAQUE ) 350 MG/ML injection. Multiplanar reformatted images are provided for review. Automated exposure control, iterative reconstruction, and/or weight based adjustment of the mA/kV was utilized to reduce the radiation dose to as low as reasonably achievable. COMPARISON: None available. CLINICAL HISTORY: Epiglottitis or tonsillitis suspected; concern for PTA. FINDINGS: AERODIGESTIVE TRACT: There is significant enlargement at the bilateral palatine tonsils with striated enhancement compatible with tonsillitis. There is associated moderate narrowing of the oropharyngeal airway along the lateral aspect and posterolateral aspect of the right palatine tonsil. There is a 2.9 x 1.3 x  2.9 cm focus of irregular hypoattenuation. There are areas of peripheral enhancement noted posteriorly along this collection. Findings are concerning for developing peritonsillar abscess. There is likely a component of phlegmon along the anterolateral aspect of the right palatine tonsil. Enhancement of the pharyngeal mucosa suggestive of pharyngitis. There is edema extending along the right aspect of the oropharynx to the level of the hypopharynx. There is asymmetric effacement of the right piriform sinus. SALIVARY GLANDS: The parotid and submandibular glands are unremarkable. THYROID : Unremarkable. LYMPH NODES: Enlarged bilateral level  2a nodes with additional prominent bilateral level 1b nodes. Largest node on the right in level 2 measures up to 1.3 cm in short axis and largest node on the left measures up to 1.4 cm. Additional subcentimeter cervical lymph nodes bilaterally. SOFT TISSUES: No evidence of collection or significant soft tissue swelling involving the floor of mouth. BRAIN, ORBITS, SINUSES AND MASTOIDS: No acute abnormality. Mucosal thickening in the bilateral maxillary sinuses. LUNGS AND MEDIASTINUM: No acute abnormality. BONES: No focal bone abnormality. IMPRESSION: 1. Significant enlargement of the bilateral palatine tonsils with striated enhancement compatible with tonsillitis. Associated moderate narrowing of the oropharyngeal airway. 2. There is a 2.9 x 1.3 x 2.9 cm irregular collection along the right palatine tonsil, concerning for developing peritonsillar abscess. Posteriorly and inferiorly the collection appears more organized and concerning for abscess. Anteriorly it is more ill-defined and appears phlegmonous. 3. Enlarged bilateral level 2a nodes, likely reactive. 4. Mucosal thickening in the bilateral maxillary sinuses. Electronically signed by: Donnice Mania MD 05/24/2024 06:40 PM EST RP Workstation: [id number]     Procedures   Medications Ordered in the ED  ampicillin (OMNIPEN) 2 g  in sodium chloride  0.9 % 100 mL IVPB (0 g Intravenous Stopped 05/24/24 2000)  lactated ringers  infusion (has no administration in time range)  morphine  (PF) 4 MG/ML injection 4 mg (4 mg Intravenous Given 05/24/24 1614)  ondansetron  (ZOFRAN ) injection 4 mg (4 mg Intravenous Given 05/24/24 1613)  lactated ringers  bolus 1,000 mL (0 mLs Intravenous Stopped 05/24/24 1838)  dexamethasone  (DECADRON ) injection 10 mg (10 mg Intravenous Given 05/24/24 1613)  morphine  (PF) 4 MG/ML injection 4 mg (4 mg Intravenous Given 05/24/24 1747)  iohexol (OMNIPAQUE) 350 MG/ML injection 75 mL (75 mLs Intravenous Contrast Given 05/24/24 1754)                                    Medical Decision Making Amount and/or Complexity of Data Reviewed Labs: ordered. Decision-making details documented in ED Course. Radiology: ordered and independent interpretation performed. Decision-making details documented in ED Course.  Risk Prescription drug management.   Pt presenting today with a complaint that caries a high risk for morbidity and mortality. Based on patient's symptoms concern for peritonsillar abscess versus phlegmon versus tonsillitis.  Lower suspicion for epiglottitis, RPA.  Does not appear to have any dental cause.  Patient given IV fluids pain and nausea control as well as Decadron .  Will also give a dose of Unasyn.  8:19 PM I independently interpreted patient's labs and patient has a leukocytosis of 24 today with stable hemoglobin, Chem-8 without acute findings, hCG is negative.  I have independently visualized and interpreted pt's images today.  Soft tissue neck CT with concern for developing abscess.  Radiology reports significant enlargement of bilateral palatine tonsils with striated enhancement compatible with tonsillitis with moderate narrowing of the oropharyngeal airway.  Also there is a 2.9 x 1.3 x 2.9 cm irregular collection along the right palate teen tonsil concerning for developing peritonsillar  abscess but still may be somewhat of a phlegmon.  On repeat evaluation patient is still unable to swallow due to severe pain.  Still not having any airway compromise.  Will consult ENT for further recommendations.  Patient has already received Decadron  and Unasyn.  8:19 PM Spoke with Dr. Carlie who reports at this time he would not recommend any surgical intervention but that she needs antibiotics.  Patient is still unable to tolerate  p.o.'s.  She attempted to drink something and immediately vomited and started coughing.  Still having significant throat pain.  Will admit for IV antibiotics and Dr. Carlie reports if her symptoms start to worsen instead of improve they can consult him for possible drainage.  Discussed this with the patient and her father.  They are comfortable with this plan.  Consulted the hospitalist for admission      Final diagnoses:  Peritonsillar abscess  Phlegmonous tonsillitis    ED Discharge Orders     None          Doretha Folks, MD 05/24/24 2019

## 2024-05-24 NOTE — Telephone Encounter (Signed)
 Noted. Dm/cma

## 2024-05-24 NOTE — Telephone Encounter (Signed)
 FYI Only or Action Required?: FYI only for provider: ED advised.  Patient was last seen in primary care on 05/23/2024 by Sebastian Beverley NOVAK, MD.  Called Nurse Triage reporting Sore Throat.  Symptoms began 2 days ago.  Interventions attempted: OTC medications: Ibuprofen  and Prescription medications: Augmentin .  Symptoms are: unchanged.  Triage Disposition: Go to ED Now (or PCP Triage)  Patient/caregiver understands and will follow disposition?: Yes     Copied from CRM [id number]. Topic: Clinical - Red Word Triage >> May 24, 2024  1:38 PM Alexandria E wrote: [facility] that prompted transfer to Nurse Triage: Body aches, and throat pain. Patient stated she cannot drink anything and she is in pain. Patient's dad on the line.      Reason for Disposition  [1] Drinking very little AND [2] dehydration suspected (e.g., no urine > 12 hours, very dry mouth, very lightheaded)  Answer Assessment - Initial Assessment Questions Patient seen for the same yesterday and started her antibiotic today. Patient reports she is experiencing severe throat pain that is causing her difficulty swallowing liquids and states she feels dehydrated. Patient advised to go to the ED due to severe pain and symptoms of dehydration. Patient verbalized understanding and agreement of this plan.     1. SYMPTOM: What's the main symptom you're concerned about? (e.g., fever, difficulty swallowing, sore throat)     Sore throat, difficulty swallowing  2. ANTIBIOTIC: What antibiotic are you taking? How many times a day?     Augmentin  3. ONSET: When was the antibiotic started?     Today  4. THROAT PAIN:  How bad is the sore throat? (Scale 1-10; mild, moderate or severe)     Moderate to severe  5. FEVER: Do you have a fever? If Yes, ask: What is your temperature, how was it measured, and when did it start?     No 6. OTHER SYMPTOMS: Do you have any other symptoms? (e.g., rash)     Body aches  7.  BETTER-SAME-WORSE: Are you getting better, staying the same, or getting worse compared to the day you started the antibiotics?     Same  Protocols used: Strep Throat Infection on Antibiotic Follow-up Call-A-[facility]

## 2024-05-25 DIAGNOSIS — J9811 Atelectasis: Secondary | ICD-10-CM | POA: Diagnosis present

## 2024-05-25 DIAGNOSIS — Z888 Allergy status to other drugs, medicaments and biological substances status: Secondary | ICD-10-CM | POA: Diagnosis not present

## 2024-05-25 DIAGNOSIS — E876 Hypokalemia: Secondary | ICD-10-CM | POA: Diagnosis present

## 2024-05-25 DIAGNOSIS — R1310 Dysphagia, unspecified: Secondary | ICD-10-CM

## 2024-05-25 DIAGNOSIS — Z8349 Family history of other endocrine, nutritional and metabolic diseases: Secondary | ICD-10-CM | POA: Diagnosis not present

## 2024-05-25 DIAGNOSIS — B009 Herpesviral infection, unspecified: Secondary | ICD-10-CM | POA: Diagnosis present

## 2024-05-25 DIAGNOSIS — J36 Peritonsillar abscess: Secondary | ICD-10-CM | POA: Diagnosis present

## 2024-05-25 DIAGNOSIS — E6609 Other obesity due to excess calories: Secondary | ICD-10-CM | POA: Diagnosis present

## 2024-05-25 DIAGNOSIS — J0300 Acute streptococcal tonsillitis, unspecified: Secondary | ICD-10-CM

## 2024-05-25 DIAGNOSIS — F32A Depression, unspecified: Secondary | ICD-10-CM | POA: Diagnosis present

## 2024-05-25 DIAGNOSIS — Z87891 Personal history of nicotine dependence: Secondary | ICD-10-CM | POA: Diagnosis not present

## 2024-05-25 DIAGNOSIS — Z6831 Body mass index (BMI) 31.0-31.9, adult: Secondary | ICD-10-CM | POA: Diagnosis not present

## 2024-05-25 DIAGNOSIS — Z91010 Allergy to peanuts: Secondary | ICD-10-CM | POA: Diagnosis not present

## 2024-05-25 DIAGNOSIS — Z86718 Personal history of other venous thrombosis and embolism: Secondary | ICD-10-CM | POA: Diagnosis not present

## 2024-05-25 DIAGNOSIS — Z886 Allergy status to analgesic agent status: Secondary | ICD-10-CM | POA: Diagnosis not present

## 2024-05-25 DIAGNOSIS — Z79899 Other long term (current) drug therapy: Secondary | ICD-10-CM | POA: Diagnosis not present

## 2024-05-25 DIAGNOSIS — E66811 Obesity, class 1: Secondary | ICD-10-CM | POA: Diagnosis present

## 2024-05-25 DIAGNOSIS — R590 Localized enlarged lymph nodes: Secondary | ICD-10-CM | POA: Diagnosis present

## 2024-05-25 DIAGNOSIS — L509 Urticaria, unspecified: Secondary | ICD-10-CM | POA: Diagnosis present

## 2024-05-25 DIAGNOSIS — J45909 Unspecified asthma, uncomplicated: Secondary | ICD-10-CM | POA: Diagnosis present

## 2024-05-25 DIAGNOSIS — Z825 Family history of asthma and other chronic lower respiratory diseases: Secondary | ICD-10-CM | POA: Diagnosis not present

## 2024-05-25 LAB — CBC
HCT: 39.8 % (ref 36.0–46.0)
Hemoglobin: 13.1 g/dL (ref 12.0–15.0)
MCH: 29.4 pg (ref 26.0–34.0)
MCHC: 32.9 g/dL (ref 30.0–36.0)
MCV: 89.4 fL (ref 80.0–100.0)
Platelets: 187 K/uL (ref 150–400)
RBC: 4.45 MIL/uL (ref 3.87–5.11)
RDW: 13.3 % (ref 11.5–15.5)
WBC: 24.1 K/uL — ABNORMAL HIGH (ref 4.0–10.5)
nRBC: 0 % (ref 0.0–0.2)

## 2024-05-25 LAB — COMPREHENSIVE METABOLIC PANEL WITH GFR
ALT: 26 U/L (ref 0–44)
AST: 16 U/L (ref 15–41)
Albumin: 2.5 g/dL — ABNORMAL LOW (ref 3.5–5.0)
Alkaline Phosphatase: 74 U/L (ref 38–126)
Anion gap: 10 (ref 5–15)
BUN: 9 mg/dL (ref 6–20)
CO2: 20 mmol/L — ABNORMAL LOW (ref 22–32)
Calcium: 7.9 mg/dL — ABNORMAL LOW (ref 8.9–10.3)
Chloride: 107 mmol/L (ref 98–111)
Creatinine, Ser: 0.92 mg/dL (ref 0.44–1.00)
GFR, Estimated: >60 mL/min (ref >60)
Glucose, Bld: 143 mg/dL — ABNORMAL HIGH (ref 70–99)
Potassium: 4 mmol/L (ref 3.5–5.1)
Sodium: 137 mmol/L (ref 135–145)
Total Bilirubin: 0.5 mg/dL (ref 0.0–1.2)
Total Protein: 6.1 g/dL — ABNORMAL LOW (ref 6.5–8.1)

## 2024-05-25 LAB — MAGNESIUM: Magnesium: 1.7 mg/dL (ref 1.7–2.4)

## 2024-05-25 MED ORDER — ACETAMINOPHEN 160 MG/5ML PO SOLN
650.0000 mg | Freq: Four times a day (QID) | ORAL | Status: DC
  Administered 2024-05-25 – 2024-05-26 (×6): 650 mg via ORAL
  Filled 2024-05-25 (×6): qty 20.3

## 2024-05-25 MED ORDER — KETOROLAC TROMETHAMINE 30 MG/ML IJ SOLN
30.0000 mg | Freq: Four times a day (QID) | INTRAMUSCULAR | Status: DC
  Administered 2024-05-25 – 2024-05-27 (×9): 30 mg via INTRAVENOUS
  Filled 2024-05-25 (×9): qty 1

## 2024-05-25 MED ORDER — PHENOL 1.4 % MT LIQD
1.0000 | OROMUCOSAL | Status: DC | PRN
  Filled 2024-05-25: qty 177

## 2024-05-25 MED ORDER — SODIUM CHLORIDE 0.9 % IV SOLN
3.0000 g | Freq: Four times a day (QID) | INTRAVENOUS | Status: DC
  Administered 2024-05-25 – 2024-05-27 (×9): 3 g via INTRAVENOUS
  Filled 2024-05-25 (×9): qty 8

## 2024-05-25 MED ORDER — HYDROMORPHONE HCL 1 MG/ML IJ SOLN: 0.5000 mg | INTRAMUSCULAR | Status: DC | PRN

## 2024-05-25 MED ORDER — MENTHOL 3 MG MT LOZG
1.0000 | LOZENGE | OROMUCOSAL | Status: DC | PRN
  Filled 2024-05-25: qty 9

## 2024-05-25 NOTE — Hospital Course (Signed)
 Calynn Ferrero is a 28 y.o. 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 Patient reports she started having sore throat Tuesday night.  She was evaluated at her PCP Wednesday and tested positive for strep. She was prescribed amoxicillin  however she was unable to take this due to difficulty swallowing. She reports a feeling of choking when attempting to swallow water, pills or food. She also endorsed associated nausea, vomiting, odynophagia, headache, chills, and mild shortness of breath but no fevers, chest pain, palpitations, abdominal pain or dizziness.    ED Course: Initial vitals show temp 1-2.5, RR 22, HR 100-120s, SBP 100-130s, SpO2 100% on room air. Initial labs significant for WBC 24.9, Hgb 16.0, K+ 3.2, negative pregnancy test.   CT soft tissue neck shows signs of tonsillitis and a 2.9 x 1.3 x 2.9 cm irregular collection along the right palatine tonsil, concerning for developing peritonsillar abscess.  Pt received IV Decadron , IV morphine , IV ampicillin  and IV LR 1 L bolus.  ENT was consulted for evaluation. TRH was consulted for admission.

## 2024-05-25 NOTE — Assessment & Plan Note (Addendum)
-   Positive outpatient strep test on 05/23/2024 - See peritonsillar abscess - If begins spiking high fever, will obtain blood cultures

## 2024-05-25 NOTE — Assessment & Plan Note (Signed)
-   Insetting of peritonsillar abscess/tonsillitis - Trial of clear liquid diet today - Continue pain control and Cepacol/Chloraseptic spray

## 2024-05-25 NOTE — Assessment & Plan Note (Signed)
Benadryl as needed

## 2024-05-25 NOTE — Progress Notes (Addendum)
 Progress Note    Olivia Hayes   FMW:[id number]  DOB: Nov 25, 1995  DOA: 05/24/2024     0 PCP: Thedora Garnette HERO, MD  Initial CC: difficulty swallowing and pain with swallowing   Hospital Course: Olivia Hayes is a 28 y.o. 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 Patient reports she started having sore throat Tuesday night.  She was evaluated at her PCP Wednesday and tested positive for strep. She was prescribed amoxicillin  however she was unable to take this due to difficulty swallowing. She reports a feeling of choking when attempting to swallow water, pills or food. She also endorsed associated nausea, vomiting, odynophagia, headache, chills, and mild shortness of breath but no fevers, chest pain, palpitations, abdominal pain or dizziness.    ED Course: Initial vitals show temp 1-2.5, RR 22, HR 100-120s, SBP 100-130s, SpO2 100% on room air. Initial labs significant for WBC 24.9, Hgb 16.0, K+ 3.2, negative pregnancy test.   CT soft tissue neck shows signs of tonsillitis and a 2.9 x 1.3 x 2.9 cm irregular collection along the right palatine tonsil, concerning for developing peritonsillar abscess.  Pt received IV Decadron , IV morphine , IV ampicillin  and IV LR 1 L bolus.  ENT was consulted for evaluation. TRH was consulted for admission.   Interval History:  Resting in bed in the ER when seen this morning but still having significant dysphagia and odynophagia but no distress.  Assessment and Plan: * Peritonsillar abscess - Positive strep test on 05/23/2024 - Had not yet started Augmentin  outpatient due to dysphagia and odynophagia - CT on admission showed findings consistent with tonsillitis along with narrowing of the oropharyngeal airway and findings concerning for peritonsillar abscess and phlegmon formation -Case discussed with ENT on admission with  recommendations for antibiotics to start and if any worsening then further ENT evaluation -Originally started on ampicillin  on admission; given concern for peritonsillar abscess, transitioning antibiotics to Unasyn  -Continue Tylenol  and Toradol  scheduled - Received a dose of Decadron  on 11/20.  Hold off on further doses for now - Continue Cepacol and Chloraseptic spray as well  Acute streptococcal tonsillitis - Positive outpatient strep test on 05/23/2024 - See peritonsillar abscess - If begins spiking high fever, will obtain blood cultures  Odynophagia - Insetting of peritonsillar abscess/tonsillitis - Trial of clear liquid diet today - Continue pain control and Cepacol/Chloraseptic spray  History of urticaria - Benadryl  as needed  Chronic asthma without complication - No signs or symptoms of exacerbation at this time  Hypokalemia - Replete as needed   Antimicrobials: Ampicillin  05/24/2024 >> 05/25/2024 Unasyn  05/25/2024 >> current  DVT prophylaxis:  enoxaparin  (LOVENOX ) injection 40 mg Start: 05/24/24 2230   Code Status:   Code Status: Full Code  Mobility Assessment (Last 72 Hours)     Mobility Assessment   No documentation.     Diet: Diet Orders (From admission, onward)     Start     Ordered   05/25/24 1044  Diet clear liquid Room service appropriate? Yes; Fluid consistency: Thin  Diet effective now       Question Answer Comment  Room service appropriate? Yes   Fluid consistency: Thin      05/25/24 1043          CLD  Barriers to discharge: none Disposition Plan:  Home  HH orders placed: n/a Status is: Obs needs inpt  Objective: Blood pressure 100/64,  pulse 97, temperature 98.9 F (37.2 C), resp. rate 18, height 5' 4 (1.626 m), weight 82.6 kg, SpO2 98%.  Examination:  Physical Exam Constitutional:      Appearance: Normal appearance.  HENT:     Head: Normocephalic and atraumatic.     Comments: Exquisite TTP along right neck, induration  appreciated over anterior cervical LN, no erythema or calor     Mouth/Throat:     Mouth: Mucous membranes are dry.     Pharynx: Pharyngeal swelling present. No oropharyngeal exudate (difficulty to visualize throat).  Eyes:     Extraocular Movements: Extraocular movements intact.  Cardiovascular:     Rate and Rhythm: Normal rate and regular rhythm.  Pulmonary:     Effort: Pulmonary effort is normal. No respiratory distress.     Breath sounds: Normal breath sounds. No wheezing.  Abdominal:     General: Bowel sounds are normal. There is no distension.     Palpations: Abdomen is soft.     Tenderness: There is no abdominal tenderness.  Musculoskeletal:        General: Normal range of motion.     Cervical back: Normal range of motion. Tenderness present.  Lymphadenopathy:     Cervical: Cervical adenopathy present.     Right cervical: Superficial cervical adenopathy, deep cervical adenopathy and posterior cervical adenopathy present.  Skin:    General: Skin is warm and dry.  Neurological:     General: No focal deficit present.     Mental Status: She is alert.  Psychiatric:        Mood and Affect: Mood normal.      Consultants:  ENT  Procedures:    Data Reviewed: Results for orders placed or performed during the hospital encounter of 05/24/24 (from the past 24 hours)  CBC with Differential/Platelet     Status: Abnormal   Collection Time: 05/24/24  4:00 PM  Result Value Ref Range   WBC 24.9 (H) 4.0 - 10.5 K/uL   RBC 5.43 (H) 3.87 - 5.11 MIL/uL   Hemoglobin 16.0 (H) 12.0 - 15.0 g/dL   HCT 52.2 (H) 63.9 - 53.9 %   MCV 87.8 80.0 - 100.0 fL   MCH 29.5 26.0 - 34.0 pg   MCHC 33.5 30.0 - 36.0 g/dL   RDW 86.4 88.4 - 84.4 %   Platelets 219 150 - 400 K/uL   nRBC 0.0 0.0 - 0.2 %   Neutrophils Relative % 89 %   Neutro Abs 22.0 (H) 1.7 - 7.7 K/uL   Lymphocytes Relative 2 %   Lymphs Abs 0.5 (L) 0.7 - 4.0 K/uL   Monocytes Relative 8 %   Monocytes Absolute 2.0 (H) 0.1 - 1.0 K/uL    Eosinophils Relative 0 %   Eosinophils Absolute 0.0 0.0 - 0.5 K/uL   Basophils Relative 0 %   Basophils Absolute 0.1 0.0 - 0.1 K/uL   Immature Granulocytes 1 %   Abs Immature Granulocytes 0.29 (H) 0.00 - 0.07 K/uL  hCG, serum, qualitative     Status: None   Collection Time: 05/24/24  4:00 PM  Result Value Ref Range   Preg, Serum NEGATIVE NEGATIVE  I-stat chem 8, ED (not at [facility], [facility] or [facility])     Status: Abnormal   Collection Time: 05/24/24  4:10 PM  Result Value Ref Range   Sodium 137 135 - 145 mmol/L   Potassium 3.2 (L) 3.5 - 5.1 mmol/L   Chloride 103 98 - 111 mmol/L   BUN 8 6 -  20 mg/dL   Creatinine, Ser 8.99 0.44 - 1.00 mg/dL   Glucose, Bld 878 (H) 70 - 99 mg/dL   Calcium , Ion 1.13 (L) 1.15 - 1.40 mmol/L   TCO2 22 22 - 32 mmol/L   Hemoglobin 17.0 (H) 12.0 - 15.0 g/dL   HCT 49.9 (H) 63.9 - 53.9 %  CBC     Status: Abnormal   Collection Time: 05/25/24  4:53 AM  Result Value Ref Range   WBC 24.1 (H) 4.0 - 10.5 K/uL   RBC 4.45 3.87 - 5.11 MIL/uL   Hemoglobin 13.1 12.0 - 15.0 g/dL   HCT 60.1 63.9 - 53.9 %   MCV 89.4 80.0 - 100.0 fL   MCH 29.4 26.0 - 34.0 pg   MCHC 32.9 30.0 - 36.0 g/dL   RDW 86.6 88.4 - 84.4 %   Platelets 187 150 - 400 K/uL   nRBC 0.0 0.0 - 0.2 %  Magnesium      Status: None   Collection Time: 05/25/24  4:53 AM  Result Value Ref Range   Magnesium  1.7 1.7 - 2.4 mg/dL  Comprehensive metabolic panel with GFR     Status: Abnormal   Collection Time: 05/25/24  4:53 AM  Result Value Ref Range   Sodium 137 135 - 145 mmol/L   Potassium 4.0 3.5 - 5.1 mmol/L   Chloride 107 98 - 111 mmol/L   CO2 20 (L) 22 - 32 mmol/L   Glucose, Bld 143 (H) 70 - 99 mg/dL   BUN 9 6 - 20 mg/dL   Creatinine, Ser 9.07 0.44 - 1.00 mg/dL   Calcium  7.9 (L) 8.9 - 10.3 mg/dL   Total Protein 6.1 (L) 6.5 - 8.1 g/dL   Albumin 2.5 (L) 3.5 - 5.0 g/dL   AST 16 15 - 41 U/L   ALT 26 0 - 44 U/L   Alkaline Phosphatase 74 38 - 126 U/L   Total Bilirubin 0.5 0.0 - 1.2 mg/dL   GFR, Estimated >39 >39  mL/min   Anion gap 10 5 - 15    I have reviewed pertinent nursing notes, vitals, labs, and images as necessary. I have ordered labwork to follow up on as indicated.  I have reviewed the last notes from staff over past 24 hours. I have discussed patient's care plan and test results with nursing staff, CM/SW, and other staff as appropriate.  Old records reviewed in assessment of this patient  Time spent: Greater than 50% of the 55 minute visit was spent in counseling/coordination of care for the patient as laid out in the A&P.   LOS: 0 days   Alm Apo, MD [facility] 05/25/2024, 12:43 PM

## 2024-05-25 NOTE — Assessment & Plan Note (Signed)
 Repleted.

## 2024-05-25 NOTE — Assessment & Plan Note (Signed)
-   Positive strep test on 05/23/2024 - Had not yet started Augmentin  outpatient due to dysphagia and odynophagia - CT on admission showed findings consistent with tonsillitis along with narrowing of the oropharyngeal airway and findings concerning for peritonsillar abscess and phlegmon formation -Case discussed with ENT on admission with recommendations for antibiotics to start and if any worsening then further ENT evaluation -Originally started on ampicillin  on admission; given concern for peritonsillar abscess, transitioning antibiotics to Unasyn  -Continue Tylenol  and Toradol  scheduled - Received a dose of Decadron  on 11/20.  Hold off on further doses for now - Continue Cepacol and Chloraseptic spray as well - Clinically appears to be improving.  Afebrile, downtrending leukocytosis, improved pain, improved swallowing

## 2024-05-25 NOTE — Assessment & Plan Note (Signed)
-   No signs or symptoms of exacerbation at this time

## 2024-05-26 DIAGNOSIS — J0300 Acute streptococcal tonsillitis, unspecified: Secondary | ICD-10-CM | POA: Diagnosis not present

## 2024-05-26 DIAGNOSIS — R1310 Dysphagia, unspecified: Secondary | ICD-10-CM | POA: Diagnosis not present

## 2024-05-26 LAB — CBC WITH DIFFERENTIAL/PLATELET
Abs Immature Granulocytes: 0.16 K/uL — ABNORMAL HIGH (ref 0.00–0.07)
Basophils Absolute: 0.1 K/uL (ref 0.0–0.1)
Basophils Relative: 0 %
Eosinophils Absolute: 0.1 K/uL (ref 0.0–0.5)
Eosinophils Relative: 0 %
HCT: 36.7 % (ref 36.0–46.0)
Hemoglobin: 12.5 g/dL (ref 12.0–15.0)
Immature Granulocytes: 1 %
Lymphocytes Relative: 11 %
Lymphs Abs: 1.9 K/uL (ref 0.7–4.0)
MCH: 29.6 pg (ref 26.0–34.0)
MCHC: 34.1 g/dL (ref 30.0–36.0)
MCV: 86.8 fL (ref 80.0–100.0)
Monocytes Absolute: 1 K/uL (ref 0.1–1.0)
Monocytes Relative: 6 %
Neutro Abs: 13.2 K/uL — ABNORMAL HIGH (ref 1.7–7.7)
Neutrophils Relative %: 82 %
Platelets: 199 K/uL (ref 150–400)
RBC: 4.23 MIL/uL (ref 3.87–5.11)
RDW: 13.4 % (ref 11.5–15.5)
WBC: 16.3 K/uL — ABNORMAL HIGH (ref 4.0–10.5)
nRBC: 0 % (ref 0.0–0.2)

## 2024-05-26 LAB — BASIC METABOLIC PANEL WITH GFR
Anion gap: 11 (ref 5–15)
BUN: 13 mg/dL (ref 6–20)
CO2: 23 mmol/L (ref 22–32)
Calcium: 8.1 mg/dL — ABNORMAL LOW (ref 8.9–10.3)
Chloride: 106 mmol/L (ref 98–111)
Creatinine, Ser: 0.71 mg/dL (ref 0.44–1.00)
GFR, Estimated: >60 mL/min (ref >60)
Glucose, Bld: 106 mg/dL — ABNORMAL HIGH (ref 70–99)
Potassium: 3.7 mmol/L (ref 3.5–5.1)
Sodium: 140 mmol/L (ref 135–145)

## 2024-05-26 LAB — MAGNESIUM: Magnesium: 2 mg/dL (ref 1.7–2.4)

## 2024-05-26 MED ORDER — TRAMADOL HCL 50 MG PO TABS
50.0000 mg | ORAL_TABLET | Freq: Once | ORAL | Status: AC
  Administered 2024-05-26: 50 mg via ORAL
  Filled 2024-05-26: qty 1

## 2024-05-26 MED ORDER — ACETAMINOPHEN 325 MG PO TABS
650.0000 mg | ORAL_TABLET | Freq: Four times a day (QID) | ORAL | Status: DC
  Administered 2024-05-26 – 2024-05-27 (×3): 650 mg via ORAL
  Filled 2024-05-26 (×3): qty 2

## 2024-05-26 NOTE — Plan of Care (Signed)

## 2024-05-26 NOTE — Progress Notes (Signed)
 Patient called out for the nurse around 0100. Patient was found in room sitting up in bed with complaints of increasing pressure on chest area when laying down, during deep inspirations or moving. Patient states she had a similar feeling in the ED but reported feeling like the pressure had spread down to her ribs and lower chest. Patient vitals were stable and heart rhythm was NSR. Patient later got up to go to the restroom and reported feelings of lightheadedness and chest pain at a level 6/10. Patient blood pressure was 107/72. Provider notified and prescribed patient PO Tramadol . Patient states Tramadol  helped with pain and was able to lay down in bed with reduced pain.

## 2024-05-26 NOTE — Progress Notes (Signed)
 Progress Note    Olivia Hayes   FMW:[id number]  DOB: [birth year]  DOA: 05/24/2024     1 PCP: Thedora Garnette HERO, MD  Initial CC: difficulty swallowing and pain with swallowing   Hospital Course: Olivia Hayes is a 28 y.o. 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 Patient reports she started having sore throat Tuesday night.  She was evaluated at her PCP Wednesday and tested positive for strep. She was prescribed amoxicillin  however she was unable to take this due to difficulty swallowing. She reports a feeling of choking when attempting to swallow water, pills or food. She also endorsed associated nausea, vomiting, odynophagia, headache, chills, and mild shortness of breath but no fevers, chest pain, palpitations, abdominal pain or dizziness.    ED Course: Initial vitals show temp 1-2.5, RR 22, HR 100-120s, SBP 100-130s, SpO2 100% on room air. Initial labs significant for WBC 24.9, Hgb 16.0, K+ 3.2, negative pregnancy test.   CT soft tissue neck shows signs of tonsillitis and a 2.9 x 1.3 x 2.9 cm irregular collection along the right palatine tonsil, concerning for developing peritonsillar abscess.  Pt received IV Decadron , IV morphine , IV ampicillin  and IV LR 1 L bolus.  ENT was consulted for evaluation. TRH was consulted for admission.   Interval History:  No events overnight.  Swallowing feels a little bit better this morning along with her pain.  Remains afebrile.  White count came down some also.  Encouraged her to continue advancing diet as able as currently not taking in much yet. Neck is less tender also.  Assessment and Plan: * Peritonsillar abscess - Positive strep test on 05/23/2024 - Had not yet started Augmentin  outpatient due to dysphagia and odynophagia - CT on admission showed findings consistent with tonsillitis along with narrowing of the  oropharyngeal airway and findings concerning for peritonsillar abscess and phlegmon formation -Case discussed with ENT on admission with recommendations for antibiotics to start and if any worsening then further ENT evaluation -Originally started on ampicillin  on admission; given concern for peritonsillar abscess, transitioning antibiotics to Unasyn  -Continue Tylenol  and Toradol  scheduled - Received a dose of Decadron  on 11/20.  Hold off on further doses for now - Continue Cepacol and Chloraseptic spray as well - Clinically appears to be improving.  Afebrile, downtrending leukocytosis, improved pain, improved swallowing  Acute streptococcal tonsillitis - Positive outpatient strep test on 05/23/2024 - See peritonsillar abscess - If begins spiking high fever, will obtain blood cultures  Odynophagia - In setting of peritonsillar abscess/tonsillitis - Continue pain control and Cepacol/Chloraseptic spray  History of urticaria - Benadryl  as needed  Chronic asthma without complication - No signs or symptoms of exacerbation at this time  Hypokalemia - Replete as needed   Antimicrobials: Ampicillin  05/24/2024 >> 05/25/2024 Unasyn  05/25/2024 >> current  DVT prophylaxis:  enoxaparin  (LOVENOX ) injection 40 mg Start: 05/24/24 2230   Code Status:   Code Status: Full Code  Mobility Assessment (Last 72 Hours)     Mobility Assessment     Row Name 05/26/24 0810 05/25/24 2020 05/25/24 1740       Does the patient have exclusion criteria? No - Perform mobility assessment No - Perform mobility assessment No - Perform mobility assessment     What is the highest level of mobility based on the mobility assessment? Level 5 (Ambulates independently) - Balance while walking independently - Complete Level 5 (  Ambulates independently) - Balance while walking independently - Complete Level 5 (Ambulates independently) - Balance while walking independently - Complete        Diet: Diet Orders (From  admission, onward)     Start     Ordered   05/25/24 2159  Diet regular Room service appropriate? Yes; Fluid consistency: Thin  Diet effective now       Question Answer Comment  Room service appropriate? Yes   Fluid consistency: Thin      05/25/24 2159          CLD  Barriers to discharge: none Disposition Plan:  Home  HH orders placed: n/a Status is: Inpatient  Objective: Blood pressure 116/79, pulse 97, temperature 98.7 F (37.1 C), temperature source Oral, resp. rate 19, height 5' 4 (1.626 m), weight 82.6 kg, SpO2 98%.  Examination:  Physical Exam Constitutional:      Appearance: Normal appearance.  HENT:     Head: Normocephalic and atraumatic.     Comments: Improved TTP along right neck, induration appreciated over anterior cervical LN, no erythema or calor     Mouth/Throat:     Mouth: Mucous membranes are dry.     Pharynx: Pharyngeal swelling present. No oropharyngeal exudate (difficulty to visualize throat).  Eyes:     Extraocular Movements: Extraocular movements intact.  Cardiovascular:     Rate and Rhythm: Normal rate and regular rhythm.  Pulmonary:     Effort: Pulmonary effort is normal. No respiratory distress.     Breath sounds: Normal breath sounds. No wheezing.  Abdominal:     General: Bowel sounds are normal. There is no distension.     Palpations: Abdomen is soft.     Tenderness: There is no abdominal tenderness.  Musculoskeletal:        General: Normal range of motion.     Cervical back: Normal range of motion. Tenderness present.  Lymphadenopathy:     Cervical: Cervical adenopathy present.     Right cervical: Superficial cervical adenopathy, deep cervical adenopathy and posterior cervical adenopathy present.  Skin:    General: Skin is warm and dry.  Neurological:     General: No focal deficit present.     Mental Status: She is alert.  Psychiatric:        Mood and Affect: Mood normal.      Consultants:  ENT  Procedures:    Data  Reviewed: Results for orders placed or performed during the hospital encounter of 05/24/24 (from the past 24 hours)  Basic metabolic panel with GFR     Status: Abnormal   Collection Time: 05/26/24  4:21 AM  Result Value Ref Range   Sodium 140 135 - 145 mmol/L   Potassium 3.7 3.5 - 5.1 mmol/L   Chloride 106 98 - 111 mmol/L   CO2 23 22 - 32 mmol/L   Glucose, Bld 106 (H) 70 - 99 mg/dL   BUN 13 6 - 20 mg/dL   Creatinine, Ser 9.28 0.44 - 1.00 mg/dL   Calcium  8.1 (L) 8.9 - 10.3 mg/dL   GFR, Estimated >39 >39 mL/min   Anion gap 11 5 - 15  CBC with Differential/Platelet     Status: Abnormal   Collection Time: 05/26/24  4:21 AM  Result Value Ref Range   WBC 16.3 (H) 4.0 - 10.5 K/uL   RBC 4.23 3.87 - 5.11 MIL/uL   Hemoglobin 12.5 12.0 - 15.0 g/dL   HCT 63.2 63.9 - 53.9 %   MCV 86.8 80.0 -  100.0 fL   MCH 29.6 26.0 - 34.0 pg   MCHC 34.1 30.0 - 36.0 g/dL   RDW 86.5 88.4 - 84.4 %   Platelets 199 150 - 400 K/uL   nRBC 0.0 0.0 - 0.2 %   Neutrophils Relative % 82 %   Neutro Abs 13.2 (H) 1.7 - 7.7 K/uL   Lymphocytes Relative 11 %   Lymphs Abs 1.9 0.7 - 4.0 K/uL   Monocytes Relative 6 %   Monocytes Absolute 1.0 0.1 - 1.0 K/uL   Eosinophils Relative 0 %   Eosinophils Absolute 0.1 0.0 - 0.5 K/uL   Basophils Relative 0 %   Basophils Absolute 0.1 0.0 - 0.1 K/uL   Immature Granulocytes 1 %   Abs Immature Granulocytes 0.16 (H) 0.00 - 0.07 K/uL  Magnesium      Status: None   Collection Time: 05/26/24  4:21 AM  Result Value Ref Range   Magnesium  2.0 1.7 - 2.4 mg/dL    I have reviewed pertinent nursing notes, vitals, labs, and images as necessary. I have ordered labwork to follow up on as indicated.  I have reviewed the last notes from staff over past 24 hours. I have discussed patient's care plan and test results with nursing staff, CM/SW, and other staff as appropriate.  Old records reviewed in assessment of this patient  Time spent: Greater than 50% of the 55 minute visit was spent in  counseling/coordination of care for the patient as laid out in the A&P.   LOS: 1 day   Alm Apo, MD [facility] 05/26/2024, 3:35 PM

## 2024-05-27 ENCOUNTER — Inpatient Hospital Stay (HOSPITAL_COMMUNITY)

## 2024-05-27 DIAGNOSIS — J0300 Acute streptococcal tonsillitis, unspecified: Secondary | ICD-10-CM | POA: Diagnosis not present

## 2024-05-27 DIAGNOSIS — R0789 Other chest pain: Secondary | ICD-10-CM

## 2024-05-27 LAB — CBC WITH DIFFERENTIAL/PLATELET
Abs Immature Granulocytes: 0.58 K/uL — ABNORMAL HIGH (ref 0.00–0.07)
Basophils Absolute: 0.1 K/uL (ref 0.0–0.1)
Basophils Relative: 1 %
Eosinophils Absolute: 0.1 K/uL (ref 0.0–0.5)
Eosinophils Relative: 1 %
HCT: 37.9 % (ref 36.0–46.0)
Hemoglobin: 12.7 g/dL (ref 12.0–15.0)
Immature Granulocytes: 4 %
Lymphocytes Relative: 13 %
Lymphs Abs: 1.8 K/uL (ref 0.7–4.0)
MCH: 29.4 pg (ref 26.0–34.0)
MCHC: 33.5 g/dL (ref 30.0–36.0)
MCV: 87.7 fL (ref 80.0–100.0)
Monocytes Absolute: 1.4 K/uL — ABNORMAL HIGH (ref 0.1–1.0)
Monocytes Relative: 10 %
Neutro Abs: 10.2 K/uL — ABNORMAL HIGH (ref 1.7–7.7)
Neutrophils Relative %: 71 %
Platelets: 224 K/uL (ref 150–400)
RBC: 4.32 MIL/uL (ref 3.87–5.11)
RDW: 13.4 % (ref 11.5–15.5)
WBC: 14.2 K/uL — ABNORMAL HIGH (ref 4.0–10.5)
nRBC: 0 % (ref 0.0–0.2)

## 2024-05-27 LAB — BASIC METABOLIC PANEL WITH GFR
Anion gap: 7 (ref 5–15)
BUN: 8 mg/dL (ref 6–20)
CO2: 26 mmol/L (ref 22–32)
Calcium: 8 mg/dL — ABNORMAL LOW (ref 8.9–10.3)
Chloride: 105 mmol/L (ref 98–111)
Creatinine, Ser: 0.75 mg/dL (ref 0.44–1.00)
GFR, Estimated: >60 mL/min (ref >60)
Glucose, Bld: 93 mg/dL (ref 70–99)
Potassium: 3.5 mmol/L (ref 3.5–5.1)
Sodium: 138 mmol/L (ref 135–145)

## 2024-05-27 LAB — TROPONIN I (HIGH SENSITIVITY): Troponin I (High Sensitivity): 2 ng/L (ref <18)

## 2024-05-27 LAB — MAGNESIUM: Magnesium: 1.5 mg/dL — ABNORMAL LOW (ref 1.7–2.4)

## 2024-05-27 MED ORDER — DEXAMETHASONE SOD PHOSPHATE PF 10 MG/ML IJ SOLN
10.0000 mg | Freq: Once | INTRAMUSCULAR | Status: AC
  Administered 2024-05-27: 10 mg via INTRAVENOUS

## 2024-05-27 MED ORDER — MAGNESIUM SULFATE 2 GM/50ML IV SOLN
2.0000 g | Freq: Once | INTRAVENOUS | Status: AC
  Administered 2024-05-27: 2 g via INTRAVENOUS
  Filled 2024-05-27: qty 50

## 2024-05-27 MED ORDER — POTASSIUM CHLORIDE CRYS ER 20 MEQ PO TBCR
40.0000 meq | EXTENDED_RELEASE_TABLET | Freq: Once | ORAL | Status: AC
  Administered 2024-05-27: 40 meq via ORAL
  Filled 2024-05-27: qty 2

## 2024-05-27 NOTE — Discharge Summary (Signed)
 Physician Discharge Summary   Olivia Hayes FMW:[id number] DOB: 1996/06/25 DOA: 05/24/2024  PCP: Thedora Garnette HERO, MD  Admit date: 05/24/2024 Discharge date: 05/27/2024  Admitted From: Home Disposition:  Home Discharging physician: Alm Apo, MD Barriers to discharge: none  Recommendations at discharge: Follow up with ENT on 11/24   Discharge Condition: stable CODE STATUS: Full  Diet recommendation:  Diet Orders (From admission, onward)     Start     Ordered   05/27/24 0000  Diet general        05/27/24 1005   05/25/24 2159  Diet regular Room service appropriate? Yes; Fluid consistency: Thin  Diet effective now       Question Answer Comment  Room service appropriate? Yes   Fluid consistency: Thin      05/25/24 2159            Hospital Course: Olivia Hayes is a 28 y.o. 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 Patient reports she started having sore throat Tuesday night.  She was evaluated at her PCP Wednesday and tested positive for strep. She was prescribed amoxicillin  however she was unable to take this due to difficulty swallowing. She reports a feeling of choking when attempting to swallow water, pills or food. She also endorsed associated nausea, vomiting, odynophagia, headache, chills, and mild shortness of breath but no fevers, chest pain, palpitations, abdominal pain or dizziness.    ED Course: Initial vitals show temp 1-2.5, RR 22, HR 100-120s, SBP 100-130s, SpO2 100% on room air. Initial labs significant for WBC 24.9, Hgb 16.0, K+ 3.2, negative pregnancy test.   CT soft tissue neck shows signs of tonsillitis and a 2.9 x 1.3 x 2.9 cm irregular collection along the right palatine tonsil, concerning for developing peritonsillar abscess.  Pt received IV Decadron , IV morphine , IV ampicillin  and IV LR 1 L bolus.  ENT was consulted for  evaluation. [facility] was consulted for admission.   Assessment and Plan: * Peritonsillar abscess - Positive strep test on 05/23/2024 - Had not yet started Augmentin  outpatient due to dysphagia and odynophagia - CT on admission showed findings consistent with tonsillitis along with narrowing of the oropharyngeal airway and findings concerning for peritonsillar abscess and phlegmon formation -Case discussed with ENT on admission with recommendations for antibiotics to start and if any worsening then further ENT evaluation -Originally started on ampicillin  on admission; given concern for peritonsillar abscess, transitioning antibiotics to Unasyn  -Continue Tylenol  and Toradol  scheduled - Received a dose of Decadron  on 11/20 and again on 11/23 prior to discahrge - Continue Cepacol and Chloraseptic spray as well - Clinically appears to be improving.  Afebrile, downtrending leukocytosis, improved pain, improved swallowing - concern for plateau some of WBC and increased bands on 11/23 along with ongoing trismus and pain referred to chest as well. Discussed with ENT on call and plan is discharge as she is stable and outpt eval on 11/24 at ENT office for possible aspiration/drainage of abscess - patient comfortable with plan; already has Augmentin  filled at home with full prescription  Acute streptococcal tonsillitis - Positive outpatient strep test on 05/23/2024 - See peritonsillar abscess - If begins spiking high fever, will obtain blood cultures  Chest pressure - presumed referred pain some from abscess - troponin negative and reassuring; very low suspicion for cardiac etiology - CXR obtained on 11/23 showing low volumes and likely bibasilar atelectasis not  unexpected in setting of body habitus and being mostly bedbound in the hospital; even if potential pneumonia, covered with Augmentin  course  Odynophagia - In setting of peritonsillar abscess/tonsillitis - Continue pain control and  Cepacol/Chloraseptic spray  History of urticaria - Benadryl  as needed  Chronic asthma without complication - No signs or symptoms of exacerbation at this time  Hypokalemia - Repleted   The patient's acute and chronic medical conditions were treated accordingly. On day of discharge, patient was felt deemed stable for discharge. Patient/family member advised to call PCP or come back to ER if needed.   Principal Diagnosis: Peritonsillar abscess  Discharge Diagnoses: Active Hospital Problems   Diagnosis Date Noted   Peritonsillar abscess 05/24/2024    Priority: 1.   Acute streptococcal tonsillitis 05/24/2024    Priority: 2.   Chest pressure 05/27/2024    Priority: 3.   Odynophagia 05/25/2024    Priority: 3.   Hypokalemia 05/24/2024   Chronic asthma without complication 05/24/2024   History of urticaria 05/24/2024   Class 1 obesity due to excess calories without serious comorbidity with body mass index (BMI) of 31.0 to 31.9 in adult 08/26/2021    Resolved Hospital Problems  No resolved problems to display.     Discharge Instructions     Diet general   Complete by: As directed    Increase activity slowly   Complete by: As directed       Allergies as of 05/27/2024       Reactions   Peanuts [peanut  Oil] Anaphylaxis, Hives   Naproxen  Hives   Tolerates ketorolac , ibuprofen          Medication List     TAKE these medications    acetaminophen  500 MG tablet Commonly known as: TYLENOL  Take 2 tablets (1,000 mg total) by mouth every 6 (six) hours as needed for fever or headache (and pain).   amoxicillin -clavulanate 875-125 MG tablet Commonly known as: AUGMENTIN  Take 1 tablet by mouth 2 (two) times daily.   cetirizine  10 MG chewable tablet Commonly known as: ZYRTEC  Chew 10 mg by mouth as needed for allergies or rhinitis.   diphenhydrAMINE  50 MG tablet Commonly known as: BENADRYL  Take 1 tablet (50 mg total) by mouth at bedtime as needed for itching or  allergies.   ibuprofen  600 MG tablet Commonly known as: ADVIL  Take 1 tablet (600 mg total) by mouth every 8 (eight) hours as needed for fever or headache (and pain).   lamoTRIgine 25 MG tablet Commonly known as: LAMICTAL Take 25 mg by mouth daily.   ondansetron  4 MG disintegrating tablet Commonly known as: ZOFRAN -ODT Take 1 tablet (4 mg total) by mouth every 8 (eight) hours as needed for nausea or vomiting.   paragard intrauterine copper Iud IUD 1 each by Intrauterine route once. Inserted 2022        Follow-up Information     [facility], [facility] Follow up.   Why: ENT office will call 11/24 in the morning to schedule appointment for that day Contact information: [address] [place] [address] [phone]                Allergies  Allergen Reactions   Peanuts [Peanut  Oil] Anaphylaxis and Hives   Naproxen  Hives    Tolerates ketorolac , ibuprofen      Consultations:   Procedures:   Discharge Exam: BP 105/77 (BP Location: Right Arm)   Pulse 71   Temp 99.6 F (37.6 C) (Oral)   Resp 20  Ht 5' 4 (1.626 m)   Wt 82.6 kg   SpO2 94%   BMI 31.24 kg/m  Physical Exam Constitutional:      Appearance: Normal appearance.  HENT:     Head: Normocephalic and atraumatic.     Comments: Improved TTP along right neck, induration appreciated over anterior cervical LN, no erythema or calor     Mouth/Throat:     Mouth: Mucous membranes are moist.     Pharynx: Pharyngeal swelling present. No oropharyngeal exudate (difficulty to visualize throat).  Eyes:     Extraocular Movements: Extraocular movements intact.  Cardiovascular:     Rate and Rhythm: Normal rate and regular rhythm.  Pulmonary:     Effort: Pulmonary effort is normal. No respiratory distress.     Breath sounds: Normal breath sounds. No wheezing.  Abdominal:     General: Bowel sounds are normal. There is no distension.     Palpations: Abdomen is soft.     Tenderness:  There is no abdominal tenderness.  Musculoskeletal:        General: Normal range of motion.     Cervical back: Normal range of motion. Tenderness present.  Lymphadenopathy:     Cervical: Cervical adenopathy present.     Right cervical: Superficial cervical adenopathy, deep cervical adenopathy and posterior cervical adenopathy present.  Skin:    General: Skin is warm and dry.  Neurological:     General: No focal deficit present.     Mental Status: She is alert.  Psychiatric:        Mood and Affect: Mood normal.      The results of significant diagnostics from this hospitalization (including imaging, microbiology, ancillary and laboratory) are listed below for reference.   Microbiology: No results found for this or any previous visit (from the past 240 hours).   Labs: BNP (last 3 results) No results for input(s): BNP in the last 8760 hours. Basic Metabolic Panel: Recent Labs  Lab 05/24/24 1610 05/25/24 0453 05/26/24 0421 05/27/24 0543  NA 137 137 140 138  K 3.2* 4.0 3.7 3.5  CL 103 107 106 105  CO2  --  20* 23 26  GLUCOSE 121* 143* 106* 93  BUN 8 9 13 8   CREATININE 1.00 0.92 0.71 0.75  CALCIUM   --  7.9* 8.1* 8.0*  MG  --  1.7 2.0 1.5*   Liver Function Tests: Recent Labs  Lab 05/25/24 0453  AST 16  ALT 26  ALKPHOS 74  BILITOT 0.5  PROT 6.1*  ALBUMIN 2.5*   No results for input(s): LIPASE, AMYLASE in the last 168 hours. No results for input(s): AMMONIA in the last 168 hours. CBC: Recent Labs  Lab 05/24/24 1600 05/24/24 1610 05/25/24 0453 05/26/24 0421 05/27/24 0543  WBC 24.9*  --  24.1* 16.3* 14.2*  NEUTROABS 22.0*  --   --  13.2* 10.2*  HGB 16.0* 17.0* 13.1 12.5 12.7  HCT 47.7* 50.0* 39.8 36.7 37.9  MCV 87.8  --  89.4 86.8 87.7  PLT 219  --  187 199 224   Cardiac Enzymes: No results for input(s): CKTOTAL, CKMB, CKMBINDEX, TROPONINI in the last 168 hours. BNP: Invalid input(s): POCBNP CBG: No results for input(s): GLUCAP in the  last 168 hours. D-Dimer No results for input(s): DDIMER in the last 72 hours. Hgb A1c No results for input(s): HGBA1C in the last 72 hours. Lipid Profile No results for input(s): CHOL, HDL, LDLCALC, TRIG, CHOLHDL, LDLDIRECT in the last 72 hours. Thyroid  function studies  No results for input(s): TSH, T4TOTAL, T3FREE, THYROIDAB in the last 72 hours.  Invalid input(s): FREET3 Anemia work up No results for input(s): VITAMINB12, FOLATE, FERRITIN, TIBC, IRON, RETICCTPCT in the last 72 hours. Urinalysis    Component Value Date/Time   COLORURINE YELLOW 09/27/2022 1523   APPEARANCEUR HAZY (A) 09/27/2022 1523   LABSPEC 1.019 09/27/2022 1523   PHURINE 6.0 09/27/2022 1523   GLUCOSEU NEGATIVE 09/27/2022 1523   HGBUR NEGATIVE 09/27/2022 1523   HGBUR negative 09/09/2009 0837   BILIRUBINUR NEGATIVE 09/27/2022 1523   BILIRUBINUR NEGATIVE 03/04/2022 1354   KETONESUR NEGATIVE 09/27/2022 1523   PROTEINUR NEGATIVE 09/27/2022 1523   UROBILINOGEN 0.2 03/04/2022 1354   UROBILINOGEN 1.0 10/09/2012 2128   NITRITE NEGATIVE 09/27/2022 1523   LEUKOCYTESUR NEGATIVE 09/27/2022 1523   Sepsis Labs Recent Labs  Lab 05/24/24 1600 05/25/24 0453 05/26/24 0421 05/27/24 0543  WBC 24.9* 24.1* 16.3* 14.2*   Microbiology No results found for this or any previous visit (from the past 240 hours).  Procedures/Studies: DG CHEST PORT 1 VIEW Result Date: 05/27/2024 EXAM: 1 VIEW(S) XRAY OF THE CHEST 05/27/2024 09:00:00 AM COMPARISON: Neck CT 05/24/2024. CLINICAL HISTORY: 28 year old female with shortness of breath. FINDINGS: LUNGS AND PLEURA: Low lung volumes. Bibasilar airspace opacities. No pneumothorax. No convincing pleural effusion. HEART AND MEDIASTINUM: No acute abnormality of the cardiac and mediastinal silhouettes. BONES AND SOFT TISSUES: No acute osseous abnormality. IMPRESSION: 1. Low lung volumes with bibasilar airspace opacities, indeterminate for atelectasis or  pneumonia. PA and lateral views of the chest would be helpful when feasible. Electronically signed by: Helayne Hurst MD 05/27/2024 09:09 AM EST RP Workstation: [id number]   CT Soft Tissue Neck W Contrast Addendum Date: 05/24/2024 ** ADDENDUM #1 ** ADDENDUM: Findings discussed with Dr. Doretha at 6:41 PM on 05/24/24. ---------------------------------------------------- Electronically signed by: Donnice Mania MD 05/24/2024 11:25 PM EST RP Workstation: [id number]   Result Date: 05/24/2024 ** ORIGINAL REPORT ** EXAM: CT NECK WITH CONTRAST 05/24/2024 05:22:00 PM TECHNIQUE: CT of the neck was performed with the administration of 75 mL of iohexol  (OMNIPAQUE ) 350 MG/ML injection. Multiplanar reformatted images are provided for review. Automated exposure control, iterative reconstruction, and/or weight based adjustment of the mA/kV was utilized to reduce the radiation dose to as low as reasonably achievable. COMPARISON: None available. CLINICAL HISTORY: Epiglottitis or tonsillitis suspected; concern for PTA. FINDINGS: AERODIGESTIVE TRACT: There is significant enlargement at the bilateral palatine tonsils with striated enhancement compatible with tonsillitis. There is associated moderate narrowing of the oropharyngeal airway along the lateral aspect and posterolateral aspect of the right palatine tonsil. There is a 2.9 x 1.3 x 2.9 cm focus of irregular hypoattenuation. There are areas of peripheral enhancement noted posteriorly along this collection. Findings are concerning for developing peritonsillar abscess. There is likely a component of phlegmon along the anterolateral aspect of the right palatine tonsil. Enhancement of the pharyngeal mucosa suggestive of pharyngitis. There is edema extending along the right aspect of the oropharynx to the level of the hypopharynx. There is asymmetric effacement of the right piriform sinus. SALIVARY GLANDS: The parotid and submandibular glands are unremarkable. THYROID :  Unremarkable. LYMPH NODES: Enlarged bilateral level 2a nodes with additional prominent bilateral level 1b nodes. Largest node on the right in level 2 measures up to 1.3 cm in short axis and largest node on the left measures up to 1.4 cm. Additional subcentimeter cervical lymph nodes bilaterally. SOFT TISSUES: No evidence of collection or significant soft tissue swelling involving the floor of mouth. BRAIN, ORBITS, SINUSES AND MASTOIDS:  No acute abnormality. Mucosal thickening in the bilateral maxillary sinuses. LUNGS AND MEDIASTINUM: No acute abnormality. BONES: No focal bone abnormality. IMPRESSION: 1. Significant enlargement of the bilateral palatine tonsils with striated enhancement compatible with tonsillitis. Associated moderate narrowing of the oropharyngeal airway. 2. There is a 2.9 x 1.3 x 2.9 cm irregular collection along the right palatine tonsil, concerning for developing peritonsillar abscess. Posteriorly and inferiorly the collection appears more organized and concerning for abscess. Anteriorly it is more ill-defined and appears phlegmonous. 3. Enlarged bilateral level 2a nodes, likely reactive. 4. Mucosal thickening in the bilateral maxillary sinuses. Electronically signed by: Donnice Mania MD 05/24/2024 06:40 PM EST RP Workstation: [id number]     Time coordinating discharge: Over 30 minutes    Alm Apo, MD  [facility] 05/27/2024, 1:18 PM

## 2024-05-27 NOTE — Plan of Care (Signed)
  Problem: Education: Goal: Knowledge of General Education information will improve Description: Including pain rating scale, medication(s)/side effects and non-pharmacologic comfort measures 05/27/2024 0030 by Marvis Kenneth SAILOR, RN Outcome: Progressing 05/26/2024 2349 by Marvis Kenneth SAILOR, RN Outcome: Progressing   Problem: Health Behavior/Discharge Planning: Goal: Ability to manage health-related needs will improve 05/27/2024 0030 by Marvis Kenneth SAILOR, RN Outcome: Progressing 05/26/2024 2349 by Marvis Kenneth SAILOR, RN Outcome: Progressing   Problem: Clinical Measurements: Goal: Ability to maintain clinical measurements within normal limits will improve 05/27/2024 0030 by Marvis Kenneth SAILOR, RN Outcome: Progressing 05/26/2024 2349 by Marvis Kenneth SAILOR, RN Outcome: Progressing Goal: Will remain free from infection 05/27/2024 0030 by Marvis Kenneth SAILOR, RN Outcome: Progressing 05/26/2024 2349 by Marvis Kenneth SAILOR, RN Outcome: Progressing Goal: Diagnostic test results will improve 05/27/2024 0030 by Marvis Kenneth SAILOR, RN Outcome: Progressing 05/26/2024 2349 by Marvis Kenneth SAILOR, RN Outcome: Progressing Goal: Respiratory complications will improve 05/27/2024 0030 by Marvis Kenneth SAILOR, RN Outcome: Progressing 05/26/2024 2349 by Marvis Kenneth SAILOR, RN Outcome: Progressing Goal: Cardiovascular complication will be avoided 05/27/2024 0030 by Marvis Kenneth SAILOR, RN Outcome: Progressing 05/26/2024 2349 by Marvis Kenneth SAILOR, RN Outcome: Progressing   Problem: Activity: Goal: Risk for activity intolerance will decrease 05/27/2024 0030 by Marvis Kenneth SAILOR, RN Outcome: Progressing 05/26/2024 2349 by Marvis Kenneth SAILOR, RN Outcome: Progressing   Problem: Nutrition: Goal: Adequate nutrition will be maintained 05/27/2024 0030 by Marvis Kenneth SAILOR, RN Outcome: Progressing 05/26/2024 2349 by Marvis Kenneth SAILOR, RN Outcome: Progressing   Problem: Coping: Goal: Level  of anxiety will decrease 05/27/2024 0030 by Marvis Kenneth SAILOR, RN Outcome: Progressing 05/26/2024 2349 by Marvis Kenneth SAILOR, RN Outcome: Progressing   Problem: Elimination: Goal: Will not experience complications related to bowel motility 05/27/2024 0030 by Marvis Kenneth SAILOR, RN Outcome: Progressing 05/26/2024 2349 by Marvis Kenneth SAILOR, RN Outcome: Progressing Goal: Will not experience complications related to urinary retention 05/27/2024 0030 by Marvis Kenneth SAILOR, RN Outcome: Progressing 05/26/2024 2349 by Marvis Kenneth SAILOR, RN Outcome: Progressing   Problem: Pain Managment: Goal: General experience of comfort will improve and/or be controlled 05/27/2024 0030 by Marvis Kenneth SAILOR, RN Outcome: Progressing 05/26/2024 2349 by Marvis Kenneth SAILOR, RN Outcome: Progressing   Problem: Safety: Goal: Ability to remain free from injury will improve 05/27/2024 0030 by Marvis Kenneth SAILOR, RN Outcome: Progressing 05/26/2024 2349 by Marvis Kenneth SAILOR, RN Outcome: Progressing   Problem: Skin Integrity: Goal: Risk for impaired skin integrity will decrease 05/27/2024 0030 by Marvis Kenneth SAILOR, RN Outcome: Progressing 05/26/2024 2349 by Marvis Kenneth SAILOR, RN Outcome: Progressing

## 2024-05-27 NOTE — Assessment & Plan Note (Signed)
-   presumed referred pain some from abscess - troponin negative and reassuring; very low suspicion for cardiac etiology - CXR obtained on 11/23 showing low volumes and likely bibasilar atelectasis not unexpected in setting of body habitus and being mostly bedbound in the hospital; even if potential pneumonia, covered with Augmentin  course

## 2024-05-27 NOTE — Progress Notes (Signed)
 Pt with orders to d/c home. PIV removed. Pt is stable. Discharge education and packet provided to pt, all questions answered. Pt and all belongings transported to private vehicle via wheelchair without incident.

## 2024-06-04 ENCOUNTER — Emergency Department (HOSPITAL_COMMUNITY)

## 2024-06-04 ENCOUNTER — Ambulatory Visit: Payer: Self-pay

## 2024-06-04 ENCOUNTER — Emergency Department (HOSPITAL_COMMUNITY)
Admission: EM | Admit: 2024-06-04 | Discharge: 2024-06-04 | Disposition: A | Discharge status: Home / Self Care | Attending: Emergency Medicine | Admitting: Emergency Medicine

## 2024-06-04 ENCOUNTER — Other Ambulatory Visit: Payer: Self-pay

## 2024-06-04 ENCOUNTER — Encounter (HOSPITAL_COMMUNITY): Payer: Self-pay

## 2024-06-04 DIAGNOSIS — H5712 Ocular pain, left eye: Secondary | ICD-10-CM | POA: Diagnosis present

## 2024-06-04 DIAGNOSIS — Z91010 Allergy to peanuts: Secondary | ICD-10-CM | POA: Insufficient documentation

## 2024-06-04 DIAGNOSIS — M6008 Infective myositis, other site: Secondary | ICD-10-CM | POA: Insufficient documentation

## 2024-06-04 LAB — CBC WITH DIFFERENTIAL/PLATELET
Abs Immature Granulocytes: 0.4 K/uL — ABNORMAL HIGH (ref 0.00–0.07)
Basophils Absolute: 0.1 K/uL (ref 0.0–0.1)
Basophils Relative: 1 %
Eosinophils Absolute: 0.1 K/uL (ref 0.0–0.5)
Eosinophils Relative: 1 %
HCT: 45.9 % (ref 36.0–46.0)
Hemoglobin: 14.7 g/dL (ref 12.0–15.0)
Immature Granulocytes: 3 %
Lymphocytes Relative: 17 %
Lymphs Abs: 2.4 K/uL (ref 0.7–4.0)
MCH: 29.1 pg (ref 26.0–34.0)
MCHC: 32 g/dL (ref 30.0–36.0)
MCV: 90.9 fL (ref 80.0–100.0)
Monocytes Absolute: 0.7 K/uL (ref 0.1–1.0)
Monocytes Relative: 5 %
Neutro Abs: 10.4 K/uL — ABNORMAL HIGH (ref 1.7–7.7)
Neutrophils Relative %: 73 %
Platelets: 296 K/uL (ref 150–400)
RBC: 5.05 MIL/uL (ref 3.87–5.11)
RDW: 13.6 % (ref 11.5–15.5)
WBC: 14.2 K/uL — ABNORMAL HIGH (ref 4.0–10.5)
nRBC: 0 % (ref 0.0–0.2)

## 2024-06-04 LAB — COMPREHENSIVE METABOLIC PANEL WITH GFR
ALT: 61 U/L — ABNORMAL HIGH (ref 0–44)
AST: 25 U/L (ref 15–41)
Albumin: 3.3 g/dL — ABNORMAL LOW (ref 3.5–5.0)
Alkaline Phosphatase: 58 U/L (ref 38–126)
Anion gap: 11 (ref 5–15)
BUN: 14 mg/dL (ref 6–20)
CO2: 22 mmol/L (ref 22–32)
Calcium: 8.7 mg/dL — ABNORMAL LOW (ref 8.9–10.3)
Chloride: 103 mmol/L (ref 98–111)
Creatinine, Ser: 0.69 mg/dL (ref 0.44–1.00)
GFR, Estimated: >60 mL/min (ref >60)
Glucose, Bld: 94 mg/dL (ref 70–99)
Potassium: 4 mmol/L (ref 3.5–5.1)
Sodium: 136 mmol/L (ref 135–145)
Total Bilirubin: 0.4 mg/dL (ref 0.0–1.2)
Total Protein: 7 g/dL (ref 6.5–8.1)

## 2024-06-04 LAB — I-STAT CG4 LACTIC ACID, ED
Lactic Acid, Venous: 1.2 mmol/L (ref 0.5–1.9)
Lactic Acid, Venous: 1 mmol/L (ref 0.5–1.9)

## 2024-06-04 LAB — HCG, SERUM, QUALITATIVE: Preg, Serum: NEGATIVE

## 2024-06-04 MED ORDER — ACETAMINOPHEN 500 MG PO TABS
1000.0000 mg | ORAL_TABLET | Freq: Once | ORAL | Status: AC
  Administered 2024-06-04: 1000 mg via ORAL
  Filled 2024-06-04: qty 2

## 2024-06-04 MED ORDER — LORAZEPAM 2 MG/ML IJ SOLN
0.5000 mg | Freq: Once | INTRAMUSCULAR | Status: AC | PRN
  Administered 2024-06-04: 0.5 mg via INTRAVENOUS
  Filled 2024-06-04: qty 1

## 2024-06-04 MED ORDER — GADOBUTROL 1 MMOL/ML IV SOLN
7.5000 mL | Freq: Once | INTRAVENOUS | Status: AC | PRN
  Administered 2024-06-04: 7.5 mL via INTRAVENOUS

## 2024-06-04 MED ORDER — IOHEXOL 350 MG/ML SOLN
75.0000 mL | Freq: Once | INTRAVENOUS | Status: AC | PRN
  Administered 2024-06-04: 75 mL via INTRAVENOUS

## 2024-06-04 MED ORDER — METHYLPREDNISOLONE SODIUM SUCC 125 MG IJ SOLR
125.0000 mg | Freq: Once | INTRAMUSCULAR | Status: AC
  Administered 2024-06-04: 125 mg via INTRAVENOUS
  Filled 2024-06-04: qty 2

## 2024-06-04 MED ORDER — PREDNISONE 10 MG (21) PO TBPK: ORAL_TABLET | Freq: Every day | ORAL | 0 refills | Status: DC

## 2024-06-04 NOTE — ED Notes (Signed)
 Pt to MRI

## 2024-06-04 NOTE — ED Triage Notes (Signed)
 C/o sore throat and was admitted to the hospital and was dx. With strep , dicharged last sun. , c/o pain in her left eye today .

## 2024-06-04 NOTE — ED Triage Notes (Signed)
 Pt states when she moves eye, she gets dizzy.

## 2024-06-04 NOTE — Discharge Instructions (Addendum)
 The term poststreptococcal lateral rectus myositis is not recognized in medical literature. 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  What Is Lateral Rectus Myositis?  Lateral rectus myositis is inflammation of the muscle that controls outward eye movement. This causes eye pain, double vision, and difficulty moving the affected eye to the side.[1][3]  Your Medications:  Corticosteroids (steroid medication): Take exactly as prescribed. This medication reduces inflammation and typically works quickly--most patients improve within days to weeks. Do not stop taking this medication suddenly, as the inflammation can come back.[1][6]  Your doctor will slowly reduce your dose over time based on how you are doing.  What to Expect:  Eye pain and double vision should improve within the first few days of treatment.[1][6]  Complete recovery usually takes several weeks.  About half of patients may experience the condition coming back in the future, so it is important to watch for symptoms returning.[2][7]  Warning Signs - Call Your Doctor If:  Eye pain returns or gets worse  Double vision comes back  You develop new vision problems  You have trouble moving your eye  The eye becomes red, swollen, or bulging  You develop fever or feel generally unwell  Follow-Up Care:  Keep all scheduled appointments with your eye doctor.  You may need repeat imaging to confirm the inflammation is improving.  Your doctor will monitor you for recurrence, which can happen even months or years later.[2][8]  Important Reminders:  Do not skip doses of your medication.  Do not stop your medication without talking to your doctor first.[1]  If symptoms return after stopping medication, contact your  doctor immediately--you may need to restart treatment.[1-2]  Some patients need longer-term treatment or different medications if the condition keeps coming back.[3][7]  Activity:  You may resume normal activities as tolerated.  Avoid driving if you have double vision.  Rest your eyes if they feel tired or uncomfortable.  Questions?  Contact your eye doctor's office if you have any questions or concerns about your condition or treatment.

## 2024-06-04 NOTE — Telephone Encounter (Signed)
 [phone] patient's contact number  FYI Only or Action Required?: FYI only for provider: ED advised.  Patient was last seen in primary care on 05/23/2024 by Sebastian Beverley NOVAK, MD.  Called Nurse Triage reporting Eye Pain.  Symptoms began yesterday.  Interventions attempted: OTC medications: ibuprofen , tylenol  pm, Prescription medications: amoxicillin , and Rest, hydration, or home remedies.  Symptoms are: rapidly worsening.  Triage Disposition: Go to ED Now (Notify PCP)  Patient/caregiver understands and will follow disposition?: Yes              Copied from CRM [id number]. Topic: Clinical - Red Word Triage >> Jun 04, 2024  8:10 AM Frederich PARAS wrote: [facility] that prompted transfer to Nurse Triage: eye pain  The pain is doubled , yesterday morning,hurts to driving hurt, pt says she get light headed and hurts to touch. Reason for Disposition  SEVERE eye pain (e.g., excruciating)  Answer Assessment - Initial Assessment Questions [phone] patient's contact number Strep two weeks ago Admitted to hospital  Discharged last Sunday (a week ago) Been on amoxicillin  Patient started having left eye pain yesterday Painful to touch, pain is severe and is much worse today Patient states sometimes having dizziness and thinks her left eye looks bigger, and states the area around this eye also hurts Patient states that her vision seems slightly doubled at time With these symptoms and severe eye pain with no injuries patient is advised that the ER is recommended at this time She states that she will get her dad to take her to the ER She is advised to call us  back with any questions/concerns and she verbalized understanding.  Protocols used: Eye Pain and Other Symptoms-A-AH

## 2024-06-04 NOTE — ED Provider Notes (Signed)
 [place] EMERGENCY DEPARTMENT AT [facility] Provider Note   CSN: [id number] Arrival date & time: 06/04/24  1114     Patient presents with: Eye Pain   Olivia Hayes is a 28 y.o. female.   28 year old female with prior medical history as detailed below presents for evaluation.  Patient complains of left eye pain.  She reports that she feels like her left eye is larger than her right.  Symptoms began yesterday.  She was recently admitted and hospitalized for tonsillitis.  She is currently still taking the antibiotics (Augmentin ) that she was prescribed at time of discharge last week.  She reports that when she looks far to the left or to the right she feels dizzy.  She contacted her PCP regarding her eye complaint.  She was referred to the ED for evaluation of possible orbital cellulitis.  Patient reports that her pharyngeal symptoms have improved significantly.  She is taking p.o. well.  She denies fever.  She denies change in visual acuity.  She denies other complaint.  The history is provided by the patient and medical records.       Prior to Admission medications   Medication Sig Start Date End Date Taking? Authorizing Provider  acetaminophen  (TYLENOL ) 500 MG tablet Take 2 tablets (1,000 mg total) by mouth every 6 (six) hours as needed for fever or headache (and pain). 05/23/24   Sebastian Beverley NOVAK, MD  amoxicillin -clavulanate (AUGMENTIN ) 875-125 MG tablet Take 1 tablet by mouth 2 (two) times daily. 05/23/24   Sebastian Beverley NOVAK, MD  cetirizine  (ZYRTEC ) 10 MG chewable tablet Chew 10 mg by mouth as needed for allergies or rhinitis.    [provider]  diphenhydrAMINE  (BENADRYL ) 50 MG tablet Take 1 tablet (50 mg total) by mouth at bedtime as needed for itching or allergies. 12/14/23   McElwee, Tinnie LABOR, NP  ibuprofen  (ADVIL ) 600 MG tablet Take 1 tablet (600 mg total) by mouth every 8 (eight) hours as needed for fever or headache (and pain). 05/23/24    Sebastian Beverley NOVAK, MD  lamoTRIgine (LAMICTAL) 25 MG tablet Take 25 mg by mouth daily. Patient not taking: Reported on 05/24/2024 12/14/23   [provider]  ondansetron  (ZOFRAN -ODT) 4 MG disintegrating tablet Take 1 tablet (4 mg total) by mouth every 8 (eight) hours as needed for nausea or vomiting. Patient not taking: Reported on 05/24/2024 05/23/24   Sebastian Beverley NOVAK, MD  paragard intrauterine copper IUD IUD 1 each by Intrauterine route once. Inserted 2022    [provider]    Allergies: Peanuts [peanut  oil] and Naproxen     Review of Systems  All other systems reviewed and are negative.   Updated Vital Signs BP 120/75   Pulse 83   Temp 98 F (36.7 C)   Resp 16   Ht 5' 4 (1.626 m)   Wt 81.6 kg   SpO2 98%   BMI 30.90 kg/m   Physical Exam Vitals and nursing note reviewed.  Constitutional:      General: She is not in acute distress.    Appearance: Normal appearance. She is well-developed.  HENT:     Head: Normocephalic and atraumatic.  Eyes:     General:        Right eye: No discharge.        Left eye: No discharge.     Extraocular Movements: Extraocular movements intact.     Conjunctiva/sclera: Conjunctivae normal.     Pupils: Pupils are equal, round, and reactive  to light.  Cardiovascular:     Rate and Rhythm: Normal rate and regular rhythm.     Heart sounds: Normal heart sounds.  Pulmonary:     Effort: Pulmonary effort is normal. No respiratory distress.     Breath sounds: Normal breath sounds.  Abdominal:     General: There is no distension.     Palpations: Abdomen is soft.     Tenderness: There is no abdominal tenderness.  Musculoskeletal:        General: No deformity. Normal range of motion.     Cervical back: Normal range of motion and neck supple.  Skin:    General: Skin is warm and dry.  Neurological:     General: No focal deficit present.     Mental Status: She is alert and oriented to person, place, and time.     (all labs  ordered are listed, but only abnormal results are displayed) Labs Reviewed  COMPREHENSIVE METABOLIC PANEL WITH GFR - Abnormal; Notable for the following components:      Result Value   Calcium  8.7 (*)    Albumin 3.3 (*)    ALT 61 (*)    All other components within normal limits  CBC WITH DIFFERENTIAL/PLATELET - Abnormal; Notable for the following components:   WBC 14.2 (*)    Neutro Abs 10.4 (*)    Abs Immature Granulocytes 0.40 (*)    All other components within normal limits  HCG, SERUM, QUALITATIVE  I-STAT CG4 LACTIC ACID, ED  I-STAT CG4 LACTIC ACID, ED    EKG: None  Radiology: No results found.   Procedures   Medications Ordered in the ED - No data to display                                  Medical Decision Making Patient with recent admission for tonsillitis/peritonsillar abscess.  She is still on Augmentin .  She is not currently taking steroids.    She presents now with left lateral eye pain.  CT imaging suggests possible inflammation of the left lateral rectus muscle.  Case discussed with ophthalmology, Dr. McCuen,  who agrees with plan for MRI imaging.  If there is an abscess in the lateral rectus patient may require evaluation by oculoplastics.  This will require transfer.  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  Dr. Dean aware of pending studies and need for disposition.  Amount and/or Complexity of Data Reviewed Labs: ordered. Radiology: ordered.  Risk OTC drugs. Prescription drug management.        Final diagnoses:  Left eye pain    ED Discharge Orders     None          Laurice Maude BROCKS, MD 06/04/24 [phone]

## 2024-06-04 NOTE — ED Provider Notes (Signed)
 Pt signed out by Dr. Laurice pending MRI.  MRI reviewed by me.  I agree with the radiologist.  . Edema of the left lateral rectus muscle with mild inflammatory stranding  within the lateral intraconal fat of the left orbit. Infectious myositis and  idiopathic orbital inflammation remain the primary considerations.  2. No abscess or drainable fluid collection.   As there is no abscess, pt can go home.  She is given a dose of solumedrol in the ED and is d/c with a prednisone  taper.  She is to return if worse.  F/u with ophtho.     Dean Clarity, MD 06/04/24 [phone]

## 2024-06-05 NOTE — Progress Notes (Signed)
 ENT/FACIAL PLASTIC SURGERY CLINIC NOTE   History of Present Illness The patient is a 28 year old female who presents for follow-up regarding her right peritonsillar abscess.   She was last seen for a consultation on 05/28/2024, at which time there was no residual abscess, and she was encouraged to finish her course of Augmentin . She reports an improvement in her condition following hospitalization yesterday due to the spread of the infection to her LEFT eye. An MRI and CT scan revealed a small residual abscess and an infected and inflamed vein in the back of her eyeball, causing her eye to bulge and restricting her lateral gaze. She has not yet consulted an ophthalmologist but has been referred to one. Her vision has improved since yesterday after receiving steroids, although she experiences mild dizziness. She is uncertain about the scheduling of her ophthalmology appointment.  She reports no new throat issues and has completed her course of Augmentin . She expresses interest in undergoing a tonsillectomy within the next 2 to 3 months. She reports pain when looking forward or to the corners and no facial numbness. She also reports difficulty swallowing medication. She was administered steroids intravenously during her hospital stay and is currently on a steroid regimen.  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  Physical Exam Eyes: EOMI. PERRL.  Throat: Tonsils 3+, no exudates/erythema. No uvular deviation. No sign of recurrent PTA. Neck: Unremarkable examination.  Results MRI ORBITS w/wo contrast 06/04/24  IMPRESSION: 1. Edema of the left lateral rectus muscle with mild inflammatory stranding within the lateral intraconal fat of the left orbit. Infectious myositis and idiopathic orbital inflammation remain the primary considerations. 2. No abscess or drainable fluid  collection.  Electronically signed by: Franky Stanford MD 06/04/2024 07:39 PM EST RP Workstation: [id number] Exam End: 06/04/24 18:43 Last Resulted: 06/04/24 19:39     Imaging  - MRI of the eye: Infection and inflammation in one of the veins in the back of the eyeball, causing bulging and restricted movement.  - CT scan of the throat: Small residual abscess in the throat. CT Orbits IAC Contrast 06/04/24  CT ORBITS WITH CONTRAST 06/04/2024 02:35:15 PM  TECHNIQUE: CT of the orbits was performed with the administration of 75 mL of iohexol  (OMNIPAQUE ) 350 MG/ML injection. Multiplanar reformatted images are provided for review. Automated exposure control, iterative reconstruction, and/or weight based adjustment of the mA/kV was utilized to reduce the radiation dose to as low as reasonably achievable.  COMPARISON: CT neck 05/24/2024.  CLINICAL HISTORY: Periorbital cellulitis.  FINDINGS:  ORBITS: Globes are intact. New diffuse enlargement of the muscle belly of the left lateral rectus muscle, which is largely hypodense although there is a 5 mm focus of ring enhancement in the posterior aspect of the muscle. The other extraocular muscles are unremarkable. New mild intraconal and extraconal fat stranding in the left orbit.  SOFT TISSUES: The palatine tonsils were incompletely imaged, however prominent bilateral tonsillar enlargement on the prior CT has greatly decreased. The right peritonsillar fluid collection has decreased in size, with a 1.1 cm residual collection remaining. A persistent mildly enlarged left level 2 lymph node is partially visualized, measuring 1.4 cm in short axis.  SINUSES AND MASTOIDS: Mild mucosal thickening/small mucous retention cysts in the left maxillary sinus. No sinus fluid levels. Clear mastoid air cells.  BONES: No acute abnormality.  IMPRESSION: 1. New diffuse enlargement/edema of the left lateral rectus  muscle with surrounding inflammation  concerning for infection/myositis. A 5 mm focus of ring enhancement in the posterior aspect of the muscle may represent a small abscess. Noninfectious etiologies such as idiopathic orbital inflammation are also considered. 2. Improved tonsillitis with decreased size of a right peritonsillar abscess, now 1.1 cm.  Electronically signed by: Dasie Hamburg MD 06/04/2024 04:09 PM EST RP Workstation: [id number] Exam End: 06/04/24 14:35 Last Resulted: 06/04/24 16:0   Assessment & Plan Right peritonsillar abscess - resolved.  Discussed that the patient is eligible for tonsillectomy due to history of severe peritonsillar abscess.   Discussed R/B/A including risks of pain, bleeding (1-4% risk of post-tonsillectomy hemorrhage), infection, injury to the teeth, lips, gums, tongue, dysphagia, odynophagia, foul breath, failure to relieve symptoms, risks of general anesthesia including death (1:18,000 - 1:50,000 risk with outpatient tonsillectomy). The patient will contact my office once she desires scheduling.   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 If severe symptoms occur before this appointment, return to the ER for reassessment.  Medical Decision Making:3 #/Compl Problems  3                          Data Rev  3                 Management 3             (1-Straightforward, 2-Low, 3- Moderate, 4-High)   Electronically signed by:  Elspeth Coddington, MD  Staff Physician Facial Plastic & Reconstructive Surgery Otolaryngology - Head and Neck Surgery [facility] [place]

## 2024-06-08 ENCOUNTER — Encounter (HOSPITAL_COMMUNITY): Payer: Self-pay

## 2024-06-08 ENCOUNTER — Other Ambulatory Visit: Payer: Self-pay

## 2024-06-08 ENCOUNTER — Emergency Department (HOSPITAL_COMMUNITY)

## 2024-06-08 ENCOUNTER — Telehealth: Payer: Self-pay

## 2024-06-08 ENCOUNTER — Emergency Department (HOSPITAL_COMMUNITY)
Admission: EM | Admit: 2024-06-08 | Discharge: 2024-06-08 | Disposition: A | Discharge status: Home / Self Care | Attending: Emergency Medicine | Admitting: Emergency Medicine

## 2024-06-08 DIAGNOSIS — D72829 Elevated white blood cell count, unspecified: Secondary | ICD-10-CM | POA: Insufficient documentation

## 2024-06-08 DIAGNOSIS — M609 Myositis, unspecified: Secondary | ICD-10-CM | POA: Insufficient documentation

## 2024-06-08 DIAGNOSIS — Z91010 Allergy to peanuts: Secondary | ICD-10-CM | POA: Insufficient documentation

## 2024-06-08 LAB — CBC WITH DIFFERENTIAL/PLATELET
Abs Immature Granulocytes: 0.09 K/uL — ABNORMAL HIGH (ref 0.00–0.07)
Basophils Absolute: 0.1 K/uL (ref 0.0–0.1)
Basophils Relative: 1 %
Eosinophils Absolute: 0.1 K/uL (ref 0.0–0.5)
Eosinophils Relative: 0 %
HCT: 41.8 % (ref 36.0–46.0)
Hemoglobin: 13.8 g/dL (ref 12.0–15.0)
Immature Granulocytes: 1 %
Lymphocytes Relative: 32 %
Lymphs Abs: 3.5 K/uL (ref 0.7–4.0)
MCH: 29.9 pg (ref 26.0–34.0)
MCHC: 33 g/dL (ref 30.0–36.0)
MCV: 90.5 fL (ref 80.0–100.0)
Monocytes Absolute: 0.8 K/uL (ref 0.1–1.0)
Monocytes Relative: 7 %
Neutro Abs: 6.6 K/uL (ref 1.7–7.7)
Neutrophils Relative %: 59 %
Platelets: 271 K/uL (ref 150–400)
RBC: 4.62 MIL/uL (ref 3.87–5.11)
RDW: 13.8 % (ref 11.5–15.5)
WBC: 11.1 K/uL — ABNORMAL HIGH (ref 4.0–10.5)
nRBC: 0 % (ref 0.0–0.2)

## 2024-06-08 LAB — COMPREHENSIVE METABOLIC PANEL WITH GFR
ALT: 41 U/L (ref 0–44)
AST: 15 U/L (ref 15–41)
Albumin: 3.3 g/dL — ABNORMAL LOW (ref 3.5–5.0)
Alkaline Phosphatase: 47 U/L (ref 38–126)
Anion gap: 11 (ref 5–15)
BUN: 12 mg/dL (ref 6–20)
CO2: 21 mmol/L — ABNORMAL LOW (ref 22–32)
Calcium: 8.4 mg/dL — ABNORMAL LOW (ref 8.9–10.3)
Chloride: 107 mmol/L (ref 98–111)
Creatinine, Ser: 0.77 mg/dL (ref 0.44–1.00)
GFR, Estimated: >60 mL/min (ref >60)
Glucose, Bld: 83 mg/dL (ref 70–99)
Potassium: 3.4 mmol/L — ABNORMAL LOW (ref 3.5–5.1)
Sodium: 139 mmol/L (ref 135–145)
Total Bilirubin: 0.6 mg/dL (ref 0.0–1.2)
Total Protein: 6.6 g/dL (ref 6.5–8.1)

## 2024-06-08 MED ORDER — PREDNISONE 10 MG PO TABS: 50.0000 mg | ORAL_TABLET | Freq: Every day | ORAL | 0 refills | Status: DC

## 2024-06-08 MED ORDER — FLUORESCEIN SODIUM 1 MG OP STRP
1.0000 | ORAL_STRIP | Freq: Once | OPHTHALMIC | Status: DC
  Filled 2024-06-08: qty 1

## 2024-06-08 MED ORDER — LORAZEPAM 2 MG/ML IJ SOLN
0.5000 mg | Freq: Once | INTRAMUSCULAR | Status: AC
  Administered 2024-06-08: 0.5 mg via INTRAVENOUS
  Filled 2024-06-08: qty 1

## 2024-06-08 MED ORDER — HYDROCODONE-ACETAMINOPHEN 5-325 MG PO TABS
1.0000 | ORAL_TABLET | Freq: Once | ORAL | Status: AC
  Administered 2024-06-08: 1 via ORAL
  Filled 2024-06-08: qty 1

## 2024-06-08 MED ORDER — OXYCODONE-ACETAMINOPHEN 5-325 MG PO TABS: 1.0000 | ORAL_TABLET | Freq: Four times a day (QID) | ORAL | 0 refills | Status: AC | PRN

## 2024-06-08 MED ORDER — KETOROLAC TROMETHAMINE 15 MG/ML IJ SOLN
15.0000 mg | Freq: Once | INTRAMUSCULAR | Status: AC
  Administered 2024-06-08: 15 mg via INTRAVENOUS
  Filled 2024-06-08: qty 1

## 2024-06-08 MED ORDER — GADOBUTROL 1 MMOL/ML IV SOLN
8.0000 mL | Freq: Once | INTRAVENOUS | Status: AC | PRN
  Administered 2024-06-08: 8 mL via INTRAVENOUS

## 2024-06-08 MED ORDER — TETRACAINE HCL 0.5 % OP SOLN
2.0000 [drp] | Freq: Once | OPHTHALMIC | Status: DC
  Filled 2024-06-08: qty 4

## 2024-06-08 NOTE — ED Triage Notes (Addendum)
 Pt came in via POV d/t Lt eye pain & swelling that started last Sunday, denies any vision changes. Also having pressure felt in her face & around that eye. Endorses that she was sick & admitted to hospital 4 days ago & told she had mitosis (per pt). She was Tx with steroids & told if this became any worse to come in for eva. A/Ox4, rates her pain 9/10 during triage, states IBU has helped some.

## 2024-06-08 NOTE — ED Provider Notes (Signed)
  Physical Exam  BP 101/77 (BP Location: Right Arm)   Pulse 66   Temp 97.7 F (36.5 C)   Resp 15   SpO2 100%   Physical Exam Vitals and nursing note reviewed.  Constitutional:      Appearance: Normal appearance.  HENT:     Head: Normocephalic and atraumatic.     Comments:   Eyes:     General:        Right eye: No discharge.        Left eye: No discharge.     Conjunctiva/sclera: Conjunctivae normal.     Comments: Mild periorbital swelling on the left.  Sclera are normal bilaterally.  Difficulty tracking to the left secondary to pain and swelling.  Pupils are equal round and reactive to light.  Pulmonary:     Effort: Pulmonary effort is normal.  Skin:    General: Skin is warm and dry.     Findings: No rash.  Neurological:     General: No focal deficit present.     Mental Status: She is alert.  Psychiatric:        Mood and Affect: Mood normal.        Behavior: Behavior normal.     Procedures  Procedures  ED Course / MDM   Clinical Course as of 06/08/24 1652  Fri Jun 08, 2024  1641 CBC with Differential(!) Leukocytosis in the setting of steroids. [CF]  1641 Comprehensive metabolic panel(!) Mild hypokalemia. [CF]  1641 MR MRV HEAD W WO CONTRAST No evidence of dural venous thrombosis. [CF]  1642 MR ORBITS W WO CONTRAST Consistent with left lateral rectus myositis. [CF]  1650 I spoke with Dr. McCuen who recommends slowing down and steroid taper.  Agreeable with plan for discharge and outpatient follow-up. [CF]    Clinical Course User Index [CF] Theotis Cameron HERO, PA-C   Medical Decision Making Amount and/or Complexity of Data Reviewed Labs: ordered. Decision-making details documented in ED Course. Radiology: ordered. Decision-making details documented in ED Course.  Risk Prescription drug management.   Accepted handoff at shift change from Dufour PA-C. Please see prior provider note for more detail.   Briefly: Patient is 28 y.o. 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  DDX: concern for dural venous thrombosis versus worsening myositis.  Plan: Discharge home with ophthalmology follow-up.  Continue steroids.  Patient currently on day 3.  Will give her some narcotic pain medication for breakthrough pain.              Theotis Cameron HERO, PA-C 06/08/24 1656    Elnor Jayson LABOR, DO 06/12/24 [phone]

## 2024-06-08 NOTE — Discharge Instructions (Addendum)
 I have given you 2 extra days of prednisone .  Per ophthalmology I would like for you to take 5 tablets for the next 2 days starting tomorrow.  You took 5 tablets today so take 5 tablets for 2 more days.  And then continue the taper as it was prescribed before.  I would like for you to call ophthalmology and schedule an appointment for next week.  You can return to the emergency department for any worsening symptoms.

## 2024-06-08 NOTE — Telephone Encounter (Signed)
 [facility] contacted CM for ICD-10 code pertaining to prescription. M60.9 give for Myositis. Pharmacy to fill script.   Merilee Batty, MSN, RN Case Management [phone]

## 2024-06-08 NOTE — ED Provider Notes (Signed)
 [place] EMERGENCY DEPARTMENT AT [facility] Provider Note   CSN: [id number] Arrival date & time: 06/08/24  9297     Patient presents with: Eye Pain   Rashana Andrew is a 28 y.o. female with no pertinent past medical history, who presents emergency department for evaluation of left eye pain and swelling.  Patient reports that she was recently admitted to the hospital for a PTA and completed a course of Augmentin .  As she was discharged, she started experiencing this eye pain.  She was seen on 12/1 and diagnosed with a lateral rectus myositis.  Recommendation at that time was to take high-dose steroids, which patient reports she has been doing.  She states she was starting to feel better, until this morning when she woke up with a very painful and swollen eye.  She reports painful eye movements laterally.  She denies any photophobia, visual changes, any other systemic symptoms.  She states she did not yet take her steroid dose this morning.   Eye Pain       Prior to Admission medications   Medication Sig Start Date End Date Taking? Authorizing Provider  acetaminophen  (TYLENOL ) 500 MG tablet Take 2 tablets (1,000 mg total) by mouth every 6 (six) hours as needed for fever or headache (and pain). 05/23/24   Sebastian Beverley NOVAK, MD  amoxicillin -clavulanate (AUGMENTIN ) 875-125 MG tablet Take 1 tablet by mouth 2 (two) times daily. 05/23/24   Sebastian Beverley NOVAK, MD  cetirizine  (ZYRTEC ) 10 MG chewable tablet Chew 10 mg by mouth as needed for allergies or rhinitis.    [provider]  diphenhydrAMINE  (BENADRYL ) 50 MG tablet Take 1 tablet (50 mg total) by mouth at bedtime as needed for itching or allergies. 12/14/23   McElwee, Lauren A, NP  ibuprofen  (ADVIL ) 600 MG tablet Take 1 tablet (600 mg total) by mouth every 8 (eight) hours as needed for fever or headache (and pain). 05/23/24   Sebastian Beverley NOVAK, MD  lamoTRIgine (LAMICTAL) 25 MG tablet Take 25 mg by mouth  daily. Patient not taking: Reported on 05/24/2024 12/14/23   [provider]  ondansetron  (ZOFRAN -ODT) 4 MG disintegrating tablet Take 1 tablet (4 mg total) by mouth every 8 (eight) hours as needed for nausea or vomiting. Patient not taking: Reported on 05/24/2024 05/23/24   Sebastian Beverley NOVAK, MD  paragard intrauterine copper IUD IUD 1 each by Intrauterine route once. Inserted 2022    [provider]  predniSONE  (STERAPRED UNI-PAK 21 TAB) 10 MG (21) TBPK tablet Take by mouth daily. Take 6 tabs by mouth daily  for 2 days, then 5 tabs for 2 days, then 4 tabs for 2 days, then 3 tabs for 2 days, 2 tabs for 2 days, then 1 tab by mouth daily for 2 days 06/04/24   Dean Clarity, MD    Allergies: Peanuts [peanut  oil] and Naproxen     Review of Systems  Eyes:  Positive for pain.    Updated Vital Signs BP 101/77 (BP Location: Right Arm)   Pulse 66   Temp 97.7 F (36.5 C)   Resp 15   SpO2 100%   Physical Exam Vitals and nursing note reviewed.  Constitutional:      Appearance: Normal appearance.  HENT:     Head: Normocephalic and atraumatic.     Mouth/Throat:     Mouth: Mucous membranes are moist.  Eyes:     General: No scleral icterus.       Right eye: No discharge.  Left eye: No discharge.     Conjunctiva/sclera: Conjunctivae normal.     Comments: Patient with proptosis on the left and obvious swelling to the left upper and lower eyelid.  She does have significant pain with lateral eye movements and is unable to complete this.  She is able to move her eye medially without difficulty.  Cardiovascular:     Rate and Rhythm: Normal rate and regular rhythm.     Pulses: Normal pulses.  Pulmonary:     Effort: Pulmonary effort is normal.     Breath sounds: Normal breath sounds.  Abdominal:     General: There is no distension.     Tenderness: There is no abdominal tenderness.  Musculoskeletal:        General: No deformity.     Cervical back: Normal range of motion.   Skin:    General: Skin is warm and dry.     Capillary Refill: Capillary refill takes less than 2 seconds.  Neurological:     Mental Status: She is alert.     Motor: No weakness.  Psychiatric:        Mood and Affect: Mood normal.     (all labs ordered are listed, but only abnormal results are displayed) Labs Reviewed  CBC WITH DIFFERENTIAL/PLATELET - Abnormal; Notable for the following components:      Result Value   WBC 11.1 (*)    Abs Immature Granulocytes 0.09 (*)    All other components within normal limits  COMPREHENSIVE METABOLIC PANEL WITH GFR - Abnormal; Notable for the following components:   Potassium 3.4 (*)    CO2 21 (*)    Calcium  8.4 (*)    Albumin 3.3 (*)    All other components within normal limits    EKG: None  Radiology: No results found.  Procedures   Medications Ordered in the ED  LORazepam  (ATIVAN ) injection 0.5 mg (0.5 mg Intravenous Given 06/08/24 1344)  HYDROcodone -acetaminophen  (NORCO/VICODIN) 5-325 MG per tablet 1 tablet (1 tablet Oral Given 06/08/24 1142)  gadobutrol  (GADAVIST ) 1 MMOL/ML injection 8 mL (8 mLs Intravenous Contrast Given 06/08/24 1441)                                Medical Decision Making Amount and/or Complexity of Data Reviewed Labs: ordered. Radiology: ordered.  Risk Prescription drug management.   This patient presents to the ED for concern of eye pain, this involves an extensive number of treatment options, and is a complaint that carries with it a high risk of complications and morbidity.  Differential diagnosis includes: Orbital cellulitis, preseptal cellulitis, lateral rectus myositis, dural venous thrombosis  Co morbidities:  none   Additional history:  Patient was seen and evaluated for the same eye complaint on 12/1.  At that time, she was diagnosed with a lateral rectus myositis and started on a 5-day prednisone  taper.  Lab Tests:  I Ordered, and personally interpreted labs.  The pertinent results  include:    - WBC: 11.1  Imaging Studies:  I ordered imaging studies including MRV, MRI orbits Images are not yet read at this time   Cardiac Monitoring/ECG:  The patient was maintained on a cardiac monitor.  I personally viewed and interpreted the cardiac monitored which showed an underlying rhythm of: Sinus rhythm  Medicines ordered and prescription drug management:  I ordered medication including  Medications  LORazepam  (ATIVAN ) injection 0.5 mg (0.5 mg Intravenous Given 06/08/24  1344)  HYDROcodone -acetaminophen  (NORCO/VICODIN) 5-325 MG per tablet 1 tablet (1 tablet Oral Given 06/08/24 1142)  gadobutrol  (GADAVIST ) 1 MMOL/ML injection 8 mL (8 mLs Intravenous Contrast Given 06/08/24 1441)   for pain control Reevaluation of the patient after these medicines showed that the patient improved I have reviewed the patients home medicines and have made adjustments as needed  Test Considered:   none  Critical Interventions:   none  Consultations Obtained: None  Problem List / ED Course:     ICD-10-CM   1. Myositis of other site, unspecified myositis type  M60.9       MDM: 28 year old female who presents the emergency department for evaluation of left eye pain and swelling.  Patient was admitted on 11/24 PTA, requiring IV antibiotics.  She followed up with ENT after her discharge on 11/24, who stated there was no need to drain the abscess as she had adequate response to the antibiotics.  She was reportedly feeling better, and on 12/1 reported to the emergency department for evaluation of left eye pain.   Her CT did show inflammation of the left lateral rectus muscle.  She then underwent MRI for evaluation of an abscess, which was negative.  She was ultimately diagnosed with post strep lateral rectus myositis and given a dose of Solu-Medrol  in the emergency department with a prednisone  taper.  Patient was advised to follow-up with ophthalmology, which she reports she did after  discharge.  However I cannot find any record of this.  Today, patient returns to the emergency department for worsening left eye swelling and pain.  On physical exam, she has pain with lateral eye movements.  There is obvious swelling on the upper and lower eyelids.  Does have some proptosis noted.  I did order an MRI of her orbits as well as an MRV of her head with and without contrast.  Patient required 0.5 of Ativan .  Exam was just completed at this time, so final read is not yet back.  She does have a mild leukocytosis of 11.1, although this is improving.  Signout was given to Payne, PA-C.   Dispostion:  After consideration of the diagnostic results and the patients response to treatment, I feel that the patient would benefit from further workup and MRI reads.   Final diagnoses:  Myositis of other site, unspecified myositis type    ED Discharge Orders     None          Torrence Marry GORMAN DEVONNA 06/08/24 1529    Yolande Lamar BROCKS, MD 06/08/24 2020

## 2024-06-14 ENCOUNTER — Ambulatory Visit: Admitting: Family Medicine

## 2024-06-14 ENCOUNTER — Ambulatory Visit (HOSPITAL_COMMUNITY)
Admission: RE | Admit: 2024-06-14 | Discharge: 2024-06-14 | Disposition: A | Discharge status: Home / Self Care | Source: Ambulatory Visit | Attending: Family Medicine | Admitting: Family Medicine

## 2024-06-14 ENCOUNTER — Encounter: Payer: Self-pay | Admitting: Family Medicine

## 2024-06-14 ENCOUNTER — Ambulatory Visit: Payer: Self-pay | Admitting: Family Medicine

## 2024-06-14 VITALS — BP 120/64 | HR 86 | Ht 64.0 in | Wt 178.2 lb

## 2024-06-14 DIAGNOSIS — M7989 Other specified soft tissue disorders: Secondary | ICD-10-CM | POA: Diagnosis not present

## 2024-06-14 DIAGNOSIS — M79661 Pain in right lower leg: Secondary | ICD-10-CM | POA: Diagnosis not present

## 2024-06-14 DIAGNOSIS — Z86718 Personal history of other venous thrombosis and embolism: Secondary | ICD-10-CM

## 2024-06-14 DIAGNOSIS — Z8679 Personal history of other diseases of the circulatory system: Secondary | ICD-10-CM

## 2024-06-14 NOTE — Progress Notes (Signed)
 Assessment & Plan:   1. 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 Unilateral leg symptoms concerning for DVT. Prior DVT provoked by estrogen OCPs. Current episode risk factors: systemic inflammation, recent throat abscess, prednisone , but these do not fully explain recurrence at her age.  - STAT venous duplex ultrasound of symptomatic extremity (already ordered). - If DVT confirmed ? initiate DOAC (e.g., apixaban standard dosing). - If positive, thrombophilia testing indicated due to: age <30, recurrence history, autoimmune features (orbital myositis). - Return/ED precautions: chest pain, dyspnea, hemoptysis, syncope, worsening swelling/pain.  2. History of DVT (deep vein thrombosis)  Olivia Shutter, DO, MS, FAAFP, Dipl. [facility] [address] [place], [address] Dept: [phone] Dept Fax: [phone]  Subjective:   Chief Complaint  Patient presents with   Leg Pain    Pt is here  today to be seen for right leg pain. She is concerned that it could be a DVT. She has a hx of DVT in 2020, possibly caused by OCPs. 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     Review of Systems: Negative, with the exception of above mentioned in HPI.  Current Medications[1]   Objective:   BP 120/64 (BP Location: Right Arm, Cuff Size: Normal)   Pulse 86   Ht 5' 4 (1.626 m)   Wt 178 lb 3.2 oz (80.8 kg)   SpO2 98%   BMI 30.59 kg/m   Wt Readings from Last 3 Encounters:  06/14/24 178 lb 3.2 oz (80.8 kg)  06/04/24 180 lb (81.6 kg)  05/24/24 182 lb (82.6 kg)   Physical Exam Cardiovascular:     Heart sounds: Normal heart sounds.  Pulmonary:     Effort: No respiratory distress.     Breath sounds: Normal breath sounds.  Musculoskeletal:     Comments: Right lower leg  full, ttp.  Skin:    General: Skin is warm and dry.  Neurological:     Mental Status: She is alert.    Results for orders placed or performed during the hospital encounter of 06/08/24  CBC with Differential   Collection Time: 06/08/24  9:15 AM  Result Value Ref Range   WBC 11.1 (H) 4.0 - 10.5 K/uL   RBC 4.62 3.87 - 5.11 MIL/uL   Hemoglobin 13.8 12.0 - 15.0 g/dL   HCT 58.1 63.9 - 53.9 %   MCV 90.5 80.0 - 100.0 fL   MCH 29.9 26.0 - 34.0 pg   MCHC 33.0 30.0 - 36.0 g/dL   RDW 86.1 88.4 - 84.4 %   Platelets 271 150 - 400 K/uL   nRBC 0.0 0.0 - 0.2 %   Neutrophils Relative % 59 %   Neutro Abs 6.6 1.7 - 7.7 K/uL   Lymphocytes Relative 32 %   Lymphs Abs 3.5 0.7 - 4.0 K/uL   Monocytes Relative 7 %   Monocytes Absolute 0.8 0.1 - 1.0 K/uL   Eosinophils Relative 0 %   Eosinophils Absolute 0.1 0.0 - 0.5 K/uL   Basophils Relative 1 %   Basophils Absolute 0.1 0.0 - 0.1 K/uL   Immature Granulocytes 1 %   Abs Immature Granulocytes 0.09 (H) 0.00 - 0.07 K/uL  Comprehensive metabolic panel   Collection Time: 06/08/24  9:15 AM  Result Value Ref Range  Sodium 139 135 - 145 mmol/L   Potassium 3.4 (L) 3.5 - 5.1 mmol/L   Chloride 107 98 - 111 mmol/L   CO2 21 (L) 22 - 32 mmol/L   Glucose, Bld 83 70 - 99 mg/dL   BUN 12 6 - 20 mg/dL   Creatinine, Ser 9.22 0.44 - 1.00 mg/dL   Calcium  8.4 (L) 8.9 - 10.3 mg/dL   Total Protein 6.6 6.5 - 8.1 g/dL   Albumin 3.3 (L) 3.5 - 5.0 g/dL   AST 15 15 - 41 U/L   ALT 41 0 - 44 U/L   Alkaline Phosphatase 47 38 - 126 U/L   Total Bilirubin 0.6 0.0 - 1.2 mg/dL   GFR, Estimated >39 >39 mL/min   Anion gap 11 5 - 15       [1]  Current Outpatient Medications:    acetaminophen  (TYLENOL ) 500 MG tablet, Take 2 tablets (1,000 mg total) by mouth every 6 (six) hours as needed for fever or headache (and pain)., Disp: 30 tablet, Rfl: 0   cetirizine  (ZYRTEC ) 10 MG chewable tablet, Chew 10 mg by mouth as needed for allergies or rhinitis., Disp: , Rfl:     diphenhydrAMINE  (BENADRYL ) 50 MG tablet, Take 1 tablet (50 mg total) by mouth at bedtime as needed for itching or allergies., Disp: 30 tablet, Rfl: 0   ibuprofen  (ADVIL ) 600 MG tablet, Take 1 tablet (600 mg total) by mouth every 8 (eight) hours as needed for fever or headache (and pain)., Disp: 30 tablet, Rfl: 0   lamoTRIgine (LAMICTAL) 25 MG tablet, Take 25 mg by mouth daily., Disp: , Rfl:    ondansetron  (ZOFRAN -ODT) 4 MG disintegrating tablet, Take 1 tablet (4 mg total) by mouth every 8 (eight) hours as needed for nausea or vomiting., Disp: 20 tablet, Rfl: 0   paragard intrauterine copper IUD IUD, 1 each by Intrauterine route once. Inserted 2022, Disp: , Rfl:    predniSONE  (DELTASONE ) 20 MG tablet, Take 60 mg by mouth daily with breakfast., Disp: , Rfl:    amoxicillin -clavulanate (AUGMENTIN ) 875-125 MG tablet, Take 1 tablet by mouth 2 (two) times daily., Disp: 20 tablet, Rfl: 0   oxyCODONE -acetaminophen  (PERCOCET/ROXICET) 5-325 MG tablet, Take 1 tablet by mouth every 6 (six) hours as needed for severe pain (pain score 7-10)., Disp: 15 tablet, Rfl: 0

## 2024-06-25 ENCOUNTER — Telehealth: Payer: Self-pay

## 2024-06-25 NOTE — Telephone Encounter (Signed)
 Copied from CRM #[id number]. Topic: Clinical - Medication Question >> Jun 25, 2024 12:39 PM Alfonso ORN wrote: Reason for CRM: Pt stated her eye spec is wanting to start on a new rx and wants to know if she should take it. Appt scheduled for OV , please contact pt to advise

## 2024-06-25 NOTE — Telephone Encounter (Signed)
 Called and patient will pick up the RX from the pharmacy.  Then send us  a my chart message with the name so we can see if she needs a sooner appointment to discuss this new medication from [facility] specialist. Dm/cma

## 2024-07-13 ENCOUNTER — Ambulatory Visit: Admitting: Family Medicine

## 2024-07-13 ENCOUNTER — Encounter: Payer: Self-pay | Admitting: Family Medicine

## 2024-07-13 VITALS — BP 116/70 | HR 72 | Temp 97.7°F | Ht 64.0 in | Wt 180.2 lb

## 2024-07-13 DIAGNOSIS — H44112 Panuveitis, left eye: Secondary | ICD-10-CM | POA: Diagnosis not present

## 2024-07-13 LAB — CBC WITH DIFFERENTIAL/PLATELET
Basophils Absolute: 0 K/uL (ref 0.0–0.1)
Basophils Relative: 0.4 % (ref 0.0–3.0)
Eosinophils Absolute: 0 K/uL (ref 0.0–0.7)
Eosinophils Relative: 0.4 % (ref 0.0–5.0)
HCT: 42.2 % (ref 36.0–46.0)
Hemoglobin: 13.9 g/dL (ref 12.0–15.0)
Lymphocytes Relative: 38.1 % (ref 12.0–46.0)
Lymphs Abs: 3.3 K/uL (ref 0.7–4.0)
MCHC: 33 g/dL (ref 30.0–36.0)
MCV: 87.8 fl (ref 78.0–100.0)
Monocytes Absolute: 0.5 K/uL (ref 0.1–1.0)
Monocytes Relative: 5.9 % (ref 3.0–12.0)
Neutro Abs: 4.8 K/uL (ref 1.4–7.7)
Neutrophils Relative %: 55.2 % (ref 43.0–77.0)
Platelets: 202 K/uL (ref 150.0–400.0)
RBC: 4.8 Mil/uL (ref 3.87–5.11)
RDW: 14.9 % (ref 11.5–15.5)
WBC: 8.7 K/uL (ref 4.0–10.5)

## 2024-07-13 LAB — URINALYSIS, ROUTINE W REFLEX MICROSCOPIC
Bilirubin Urine: NEGATIVE
Ketones, ur: NEGATIVE
Leukocytes,Ua: NEGATIVE
Nitrite: NEGATIVE
Specific Gravity, Urine: 1.02 (ref 1.000–1.030)
Total Protein, Urine: NEGATIVE
Urine Glucose: NEGATIVE
Urobilinogen, UA: 0.2 (ref 0.0–1.0)
pH: 6 (ref 5.0–8.0)

## 2024-07-13 LAB — MICROALBUMIN / CREATININE URINE RATIO
Creatinine,U: 150.7 mg/dL
Microalb Creat Ratio: UNDETERMINED mg/g (ref 0.0–30.0)
Microalb, Ur: <0.7 mg/dL

## 2024-07-13 LAB — SEDIMENTATION RATE: Sed Rate: 8 mm/h (ref 0–20)

## 2024-07-13 LAB — C-REACTIVE PROTEIN: CRP: <0.5 mg/dL — ABNORMAL LOW (ref 1.0–20.0)

## 2024-07-13 NOTE — Progress Notes (Signed)
 " [facility] [address] [place] [address] Dept: [phone] Dept Fax: [phone]  Office Visit  Subjective:    Patient ID: Olivia Hayes, female    DOB: 04-16-1996, 29 y.o..   MRN: [id number]  Chief Complaint  Patient presents with   Follow-up    F/u to discuss medication Eye Doctor want her to start.   History of Present Illness:  Patient is in today for follow-up regarding a recent panuveitis and discussion of recommended medications. In Nov. Ms. Olivia Hayes was admitted at [facility] with a peritonsillar abscess. The week after her discharge, she developed left eye pain with some diplopia. She was seen int he ED and felt to have an myositis of the eye muscles. She was given Solu-Medrol  and placed on a prednisone  taper. She has since been seeing Dr. Mathilda (ophthalmology). He has diagnosed this as a panuveitis of the left eye. He obtained additional lab testing to assess for potential autoimmune or other causes of this condition. he increased her prednisone  back to 60 mg daily, as she was potentially flaring with ehr symptoms. He was concerned for possible lupus in light of the uveitis and Ms. Olivia Hayes's past history of DVT. He recommended a rheumatology referral, though Ms. Olivia Hayes has not been contacted about scheduling for this. He had recommended tapering her steroid and initiating azathioprine.   Ms. Olivia Hayes notes her prednisone  is down to 45 mg daily. She is not currently having any eye symptoms. She has not started the azathioprine due to concerns about possible side effects and her negative work-up so far.  Ms. Olivia Hayes does not have a history of facial rash, arthritis, kidney or pulmonary disease. She notes her mother does have hypothyroidism (likely due to Hashimoto's thyroiditis).  Past Medical History: Patient Active Problem List   Diagnosis Date Noted   Chest pressure 05/27/2024   Odynophagia 05/25/2024    Peritonsillar abscess 05/24/2024   Hypokalemia 05/24/2024   Chronic asthma without complication 05/24/2024   History of urticaria 05/24/2024   Acute streptococcal tonsillitis 05/24/2024   Annual physical exam 01/10/2024   Borderline hyperlipidemia 01/10/2024   Allergic rhinitis due to animal hair and dander 12/14/2023   COVID-19 03/09/2023   Right hip pain 03/09/2023   Urticaria 02/12/2022   Keratosis pilaris 11/18/2021   Class 1 obesity due to excess calories without serious comorbidity with body mass index (BMI) of 31.0 to 31.9 in adult 08/26/2021   Aphthous ulcer of mouth 03/17/2021   Drug reaction 03/17/2021   History of ecchymosis 03/17/2021   Trichomoniasis 12/28/2020   Intrauterine device 12/22/2020   Seasonal allergic rhinitis 09/12/2020   Rectal bleeding 06/12/2020   History of DVT (deep vein thrombosis) 08/24/2019   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   Inattention 01/06/2018   Globus sensation 07/11/2015   Acne 03/28/2013   Multiple allergies 02/21/2013   Past Surgical History:  Procedure Laterality Date   ABDOMINAL HERNIA REPAIR  age 3   INDUCED ABORTION     Family History  Problem Relation Age of Onset   Asthma Mother    Allergic rhinitis Mother    Thyroid  disease Mother    Colon cancer Neg Hx    Pancreatic cancer Neg Hx    Esophageal cancer Neg Hx    Stomach cancer Neg Hx    Rectal cancer Neg Hx    Liver cancer Neg Hx    Outpatient Medications Prior to Visit  Medication Sig Dispense Refill  acetaminophen  (TYLENOL ) 500 MG tablet Take 2 tablets (1,000 mg total) by mouth every 6 (six) hours as needed for fever or headache (and pain). 30 tablet 0   amoxicillin -clavulanate (AUGMENTIN ) 875-125 MG tablet Take 1 tablet by mouth 2 (two) times daily. 20 tablet 0   cetirizine  (ZYRTEC ) 10 MG chewable tablet Chew 10 mg by mouth as needed for allergies or rhinitis.     diphenhydrAMINE  (BENADRYL ) 50 MG tablet Take 1 tablet (50 mg total) by mouth at  bedtime as needed for itching or allergies. 30 tablet 0   ibuprofen  (ADVIL ) 600 MG tablet Take 1 tablet (600 mg total) by mouth every 8 (eight) hours as needed for fever or headache (and pain). 30 tablet 0   lamoTRIgine (LAMICTAL) 25 MG tablet Take 25 mg by mouth daily.     ondansetron  (ZOFRAN -ODT) 4 MG disintegrating tablet Take 1 tablet (4 mg total) by mouth every 8 (eight) hours as needed for nausea or vomiting. 20 tablet 0   oxyCODONE -acetaminophen  (PERCOCET/ROXICET) 5-325 MG tablet Take 1 tablet by mouth every 6 (six) hours as needed for severe pain (pain score 7-10). 15 tablet 0   paragard intrauterine copper IUD IUD 1 each by Intrauterine route once. Inserted 2022     predniSONE  (DELTASONE ) 20 MG tablet Take 60 mg by mouth daily with breakfast.     No facility-administered medications prior to visit.   Allergies[1]   Objective:   Today's Vitals   07/13/24 1012  BP: 116/70  Pulse: 72  Temp: 97.7 F (36.5 C)  TempSrc: Temporal  SpO2: 99%  Weight: 180 lb 3.2 oz (81.7 kg)  Height: 5' 4 (1.626 m)   Body mass index is 30.93 kg/m.   General: Well developed, well nourished. No acute distress. HEENT: Normocephalic, non-traumatic. PERRL, EOMI. Conjunctiva clear. No diplopia.  Psych: Alert and oriented. Normal mood and affect.  Health Maintenance Due  Topic Date Due   COVID-19 Vaccine (1) Never done   Pneumococcal Vaccine (1 of 2 - PCV) Never done   Hepatitis B Vaccines 19-59 Average Risk (1 of 3 - 19+ 3-dose series) Never done   Cervical Cancer Screening (Pap smear)  12/26/2023   Influenza Vaccine  02/03/2024   Lab Results Component Ref Range & Units 3 wk ago  Sodium 136 - 145 mmol/L 137  Potassium 3.5 - 5.1 mmol/L 3.9  Chloride 98 - 107 mmol/L 102  CO2 21 - 31 mmol/L 25  Anion Gap 6 - 14 mmol/L 10  Glucose, Random 70 - 99 mg/dL 75  Blood Urea Nitrogen (BUN) 7 - 25 mg/dL 11  Creatinine 9.39 - 8.79 mg/dL 9.31  eGFR >40 fO/fpw/8.26f7 >90  Albumin 3.5 - 5.7  g/dL 4.5  Total Protein 6.4 - 8.9 g/dL 7.1  Bilirubin, Total 0.3 - 1.0 mg/dL 0.5  Alkaline Phosphatase (ALP) 34 - 104 U/L 44  Aspartate Aminotransferase (AST) 13 - 39 U/L 11 Low   Alanine Aminotransferase (ALT) 7 - 52 U/L 13  Calcium  8.6 - 10.3 mg/dL 9.1   Ref Range & Units 3 wk ago  Treponema pallidum Antibody Non-Reactive Non-Reactive   Component Ref Range & Units 3 wk ago  Quantiferon Incubation Incubation performed.  Quantiferon-TB Gold Plus Negative Negative   Component Ref Range & Units 3 wk ago  WBC 4.40 - 11.00 10*3/uL 19.20 High   RBC 4.10 - 5.10 10*6/uL 5.12 High   Hemoglobin 12.3 - 15.3 g/dL 85.1  Hematocrit 64.0 - 44.6 % 44.1  Mean Corpuscular Volume (MCV) 80.0 -  96.0 fL 86.1  Mean Corpuscular Hemoglobin (MCH) 27.5 - 33.2 pg 28.8  Mean Corpuscular Hemoglobin Conc (MCHC) 33.0 - 37.0 g/dL 66.4  Red Cell Distribution Width (RDW) 12.3 - 17.0 % 14.2  Platelet Count (PLT) 150 - 450 10*3/uL 242  Mean Platelet Volume (MPV) 6.8 - 10.2 fL 8.8  Neutrophils % % 75  Lymphocytes % % 21  Monocytes % % 4  Eosinophils % % 1  Basophils % % 0  nRBC % % 0  Neutrophils Absolute 1.80 - 7.80 10*3/uL 14.30 High   Lymphocytes # 1.00 - 4.80 10*3/uL 3.90  Monocytes # 0.00 - 0.80 10*3/uL 0.80  Eosinophils # 0.00 - 0.50 10*3/uL 0.10  Basophils # 0.00 - 0.20 10*3/uL 0.00  nRBC Absolute <=0.00 10*3/uL 0.00   Component Ref Range & Units 1 mo ago  Lyme Disease Antibody Screen, IgG And IgM Negative Negative   Component Ref Range & Units 1 mo ago  Rheumatoid Factor <12.5 IU/mL <7.0   Component Ref Range & Units 1 mo ago  Antimyeloperoxidase (MPO) Antibodies 0.0 - 0.9 units <0.2  Antiproteinase 3 (Pr-3) Antibodies 0.0 - 0.9 units <0.2  C-ANCA Neg:<1:20 titer <1:20  P-ANCA Neg:<1:20 titer <1:20  pANCA Neg:<1:20 titer <1:20   Component Ref Range & Units 1 mo ago  Smith/RNP Antibodies 0.0 - 0.9 AI <0.2   Component Ref Range & Units 1 mo ago   Thyroid  Stimulating Immunoglobulin 0.00 - 0.55 IU/L <0.10     Imaging: MRV Brain (06/08/2024) IMPRESSION: 1. No evidence of dural venous sinus thrombosis. 2. Residual inflammation associated with recent tonsillitis with a residual 7 mm right-sided peritonsillar fluid collection, decreased from 06/04/2024.  MR Orbits (06/08/2024) IMPRESSION: 1. Progressive inflammation of the left lateral rectus muscle compatible with myositis. No abscess.  Assessment & Plan:   Problem List Items Addressed This Visit       Other   Panuveitis of left eye - Primary   Ms. Olivia Hayes has had a recent issue with a left panuveitis. This occurred soon after having a streptococcal peritonsillar abscess. 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 I reviewed evaluation so far by ophthalmology regarding possible autoimmune etiologies. I will add additional tests to have fully evaluated for potential etiologies. I agree with her continuing to taper her prednisone  by 5 mg weekly. Since she is having no current flare of her eye inflammation, I support her decision to hold off on starting azathioprine. I will go ahead an refer her to rheumatology for a further exam and review of her work-up.      Relevant Orders   CK   CBC with Differential/Platelet   Urinalysis, Routine w reflex microscopic   Microalbumin / creatinine urine ratio   Protein Electrophoresis, (serum)   ANA   Antiphospholipid Syndrome Diagnostic Panel-([facility])   C3 and C4   C-reactive protein   Sedimentation rate   Anti-TPO Ab (RDL)   Ambulatory referral to Rheumatology    Return if symptoms worsen or fail to improve.    Garnette CHRISTELLA Simpler, MD  I,Emily Lagle,acting as a scribe for Garnette CHRISTELLA Simpler, MD.,have documented all relevant documentation on the behalf of Garnette CHRISTELLA Simpler, MD.  I, Garnette CHRISTELLA Simpler, MD, have reviewed all documentation for this visit. The documentation on 07/13/2024 for the exam, diagnosis, procedures, and orders are  all accurate and complete.     [1]  Allergies Allergen Reactions   Peanuts [Peanut  Oil] Anaphylaxis and Hives   Naproxen  Hives  Tolerates ketorolac , ibuprofen     "

## 2024-07-13 NOTE — Assessment & Plan Note (Signed)
 Olivia Hayes has had a recent issue with a left panuveitis. This occurred soon after having a streptococcal peritonsillar abscess. 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 I reviewed evaluation so far by ophthalmology regarding possible autoimmune etiologies. I will add additional tests to have fully evaluated for potential etiologies. I agree with her continuing to taper her prednisone  by 5 mg weekly. Since she is having no current flare of her eye inflammation, I support her decision to hold off on starting azathioprine. I will go ahead an refer her to rheumatology for a further exam and review of her work-up.

## 2024-07-16 ENCOUNTER — Ambulatory Visit: Payer: Self-pay | Admitting: Family Medicine

## 2024-07-17 LAB — PROTEIN ELECTROPHORESIS, SERUM
Albumin ELP: 3.7 g/dL — ABNORMAL LOW (ref 3.8–4.8)
Alpha 1: 0.3 g/dL (ref 0.2–0.3)
Alpha 2: 0.5 g/dL (ref 0.5–0.9)
Beta 2: 0.3 g/dL (ref 0.2–0.5)
Beta Globulin: 0.4 g/dL (ref 0.4–0.6)
Gamma Globulin: 0.9 g/dL (ref 0.8–1.7)
Total Protein: 6.1 g/dL (ref 6.1–8.1)

## 2024-07-17 LAB — ANTIPHOSPHOLIPID SYNDROME DIAGNOSTIC PANEL
Anticardiolipin IgA: <2 [APL'U]/mL
Anticardiolipin IgG: <2 [GPL'U]/mL
Anticardiolipin IgM: <2 [MPL'U]/mL
Beta-2 Glyco 1 IgA: 2.3 U/mL
Beta-2 Glyco 1 IgM: <2 U/mL
Beta-2 Glyco I IgG: <2 U/mL
PTT-LA Screen: 32 s
dRVVT: 29 s

## 2024-07-17 LAB — C3 AND C4
C3 Complement: 109 mg/dL (ref 83–193)
C4 Complement: 22 mg/dL (ref 15–57)

## 2024-07-17 LAB — ANA: Anti Nuclear Antibody (ANA): NEGATIVE

## 2024-07-19 LAB — ANTI-TPO AB (RDL): Anti-TPO Ab (RDL): <9 [IU]/mL

## 2024-09-13 ENCOUNTER — Ambulatory Visit: Admitting: Internal Medicine

## 2025-01-11 ENCOUNTER — Encounter: Admitting: Family Medicine
